# Patient Record
Sex: Female | Born: 1990 | Race: Black or African American | Hispanic: No | Marital: Married | Smoking: Former smoker
Health system: Southern US, Community
[De-identification: ages and names within clinical notes are randomized; demographics above are authoritative.]

## PROBLEM LIST (undated history)

## (undated) DIAGNOSIS — T7840XA Allergy, unspecified, initial encounter: Secondary | ICD-10-CM

## (undated) DIAGNOSIS — J45909 Unspecified asthma, uncomplicated: Secondary | ICD-10-CM

## (undated) DIAGNOSIS — Z6841 Body Mass Index (BMI) 40.0 and over, adult: Secondary | ICD-10-CM

## (undated) DIAGNOSIS — F329 Major depressive disorder, single episode, unspecified: Secondary | ICD-10-CM

## (undated) HISTORY — DX: Morbid (severe) obesity due to excess calories: E66.01

## (undated) HISTORY — DX: Unspecified asthma, uncomplicated: J45.909

## (undated) HISTORY — DX: Allergy, unspecified, initial encounter: T78.40XA

## (undated) HISTORY — DX: Body Mass Index (BMI) 40.0 and over, adult: Z684

## (undated) HISTORY — DX: Major depressive disorder, single episode, unspecified: F32.9

---
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
## 2013-07-18 ENCOUNTER — Other Ambulatory Visit: Payer: Self-pay | Admitting: Emergency Medicine

## 2013-07-18 ENCOUNTER — Ambulatory Visit (INDEPENDENT_AMBULATORY_CARE_PROVIDER_SITE_OTHER): Payer: BC Managed Care – PPO | Admitting: Emergency Medicine

## 2013-07-18 VITALS — BP 144/84 | HR 66 | Temp 98.5°F | Resp 16 | Ht 65.5 in | Wt 244.0 lb

## 2013-07-18 DIAGNOSIS — K589 Irritable bowel syndrome without diarrhea: Secondary | ICD-10-CM

## 2013-07-18 DIAGNOSIS — O039 Complete or unspecified spontaneous abortion without complication: Secondary | ICD-10-CM

## 2013-07-18 DIAGNOSIS — R109 Unspecified abdominal pain: Secondary | ICD-10-CM

## 2013-07-18 DIAGNOSIS — N898 Other specified noninflammatory disorders of vagina: Secondary | ICD-10-CM

## 2013-07-18 DIAGNOSIS — Z3200 Encounter for pregnancy test, result unknown: Secondary | ICD-10-CM

## 2013-07-18 LAB — POCT URINALYSIS DIPSTICK
Glucose, UA: NEGATIVE
Ketones, UA: NEGATIVE
Nitrite, UA: NEGATIVE
Protein, UA: NEGATIVE
Spec Grav, UA: >=1.03
Urobilinogen, UA: 0.2

## 2013-07-18 LAB — POCT CBC
Lymph, poc: 2.1 (ref 0.6–3.4)
MCH, POC: 27.9 pg (ref 27–31.2)
MCHC: 31.3 g/dL — AB (ref 31.8–35.4)
MPV: 10.8 fL (ref 0–99.8)
POC Granulocyte: 3.7 (ref 2–6.9)
POC LYMPH PERCENT: 31.2 %L (ref 10–50)
Platelet Count, POC: 329 10*3/uL (ref 142–424)
RBC: 5.09 M/uL (ref 4.04–5.48)
RDW, POC: 14.1 %

## 2013-07-18 LAB — POCT WET PREP WITH KOH
Trichomonas, UA: NEGATIVE
Yeast Wet Prep HPF POC: NEGATIVE

## 2013-07-18 LAB — POCT UA - MICROSCOPIC ONLY: Yeast, UA: NEGATIVE

## 2013-07-18 MED ORDER — METRONIDAZOLE 500 MG PO TABS: 500.0000 mg | ORAL_TABLET | Freq: Two times a day (BID) | ORAL | Status: DC

## 2013-07-18 MED ORDER — LOPERAMIDE HCL 2 MG PO TABS: 2.0000 mg | ORAL_TABLET | Freq: Three times a day (TID) | ORAL | Status: DC

## 2013-07-18 NOTE — Patient Instructions (Addendum)
Bacterial Vaginosis Bacterial vaginosis (BV) is a vaginal infection where the normal balance of bacteria in the vagina is disrupted. The normal balance is then replaced by an overgrowth of certain bacteria. There are several different kinds of bacteria that can cause BV. BV is the most common vaginal infection in women of childbearing age. CAUSES   The cause of BV is not fully understood. BV develops when there is an increase or imbalance of harmful bacteria.  Some activities or behaviors can upset the normal balance of bacteria in the vagina and put women at increased risk including:  Having a new sex partner or multiple sex partners.  Douching.  Using an intrauterine device (IUD) for contraception.  It is not clear what role sexual activity plays in the development of BV. However, women that have never had sexual intercourse are rarely infected with BV. Women do not get BV from toilet seats, bedding, swimming pools or from touching objects around them.  SYMPTOMS   Grey vaginal discharge.  A fish-like odor with discharge, especially after sexual intercourse.  Itching or burning of the vagina and vulva.  Burning or pain with urination.  Some women have no signs or symptoms at all. DIAGNOSIS  Your caregiver must examine the vagina for signs of BV. Your caregiver will perform lab tests and look at the sample of vaginal fluid through a microscope. They will look for bacteria and abnormal cells (clue cells), a pH test higher than 4.5, and a positive amine test all associated with BV.  RISKS AND COMPLICATIONS   Pelvic inflammatory disease (PID).  Infections following gynecology surgery.  Developing HIV.  Developing herpes virus. TREATMENT  Sometimes BV will clear up without treatment. However, all women with symptoms of BV should be treated to avoid complications, especially if gynecology surgery is planned. Female partners generally do not need to be treated. However, BV may spread  between female sex partners so treatment is helpful in preventing a recurrence of BV.   BV may be treated with antibiotics. The antibiotics come in either pill or vaginal cream forms. Either can be used with nonpregnant or pregnant women, but the recommended dosages differ. These antibiotics are not harmful to the baby.  BV can recur after treatment. If this happens, a second round of antibiotics will often be prescribed.  Treatment is important for pregnant women. If not treated, BV can cause a premature delivery, especially for a pregnant woman who had a premature birth in the past. All pregnant women who have symptoms of BV should be checked and treated.  For chronic reoccurrence of BV, treatment with a type of prescribed gel vaginally twice a week is helpful. HOME CARE INSTRUCTIONS   Finish all medication as directed by your caregiver.  Do not have sex until treatment is completed.  Tell your sexual partner that you have a vaginal infection. They should see their caregiver and be treated if they have problems, such as a mild rash or itching.  Practice safe sex. Use condoms. Only have 1 sex partner. PREVENTION  Basic prevention steps can help reduce the risk of upsetting the natural balance of bacteria in the vagina and developing BV:  Do not have sexual intercourse (be abstinent).  Do not douche.  Use all of the medicine prescribed for treatment of BV, even if the signs and symptoms go away.  Tell your sex partner if you have BV. That way, they can be treated, if needed, to prevent reoccurrence. SEEK MEDICAL CARE IF:     Your symptoms are not improving after 3 days of treatment.  You have increased discharge, pain, or fever. MAKE SURE YOU:   Understand these instructions.  Will watch your condition.  Will get help right away if you are not doing well or get worse. FOR MORE INFORMATION  Division of STD Prevention (DSTDP), Centers for Disease Control and Prevention:  SolutionApps.co.za American Social Health Association (ASHA): www.ashastd.org  Document Released: 07/06/2005 Document Revised: 09/28/2011 Document Reviewed: 02/15/2013 [facility] Patient Information 2014 [place], [place]. Irritable Bowel Syndrome Irritable Bowel Syndrome (IBS) is caused by a disturbance of normal bowel function. Other terms used are spastic colon, mucous colitis, and irritable colon. It does not require surgery, nor does it lead to cancer. There is no cure for IBS. But with proper diet, stress reduction, and medication, you will find that your problems (symptoms) will gradually disappear or improve. IBS is a common digestive disorder. It usually appears in late adolescence or early adulthood. Women develop it twice as often as men. CAUSES  After food has been digested and absorbed in the small intestine, waste material is moved into the colon (large intestine). In the colon, water and salts are absorbed from the undigested products coming from the small intestine. The remaining residue, or fecal material, is held for elimination. Under normal circumstances, gentle, rhythmic contractions on the bowel walls push the fecal material along the colon towards the rectum. In IBS, however, these contractions are irregular and poorly coordinated. The fecal material is either retained too long, resulting in constipation, or expelled too soon, producing diarrhea. SYMPTOMS  The most common symptom of IBS is pain. It is typically in the lower left side of the belly (abdomen). But it may occur anywhere in the abdomen. It can be felt as heartburn, backache, or even as a dull pain in the arms or shoulders. The pain comes from excessive bowel-muscle spasms and from the buildup of gas and fecal material in the colon. This pain:  Can range from sharp belly (abdominal) cramps to a dull, continuous ache.  Usually worsens soon after eating.  Is typically relieved by having a bowel movement or passing  gas. Abdominal pain is usually accompanied by constipation. But it may also produce diarrhea. The diarrhea typically occurs right after a meal or upon arising in the morning. The stools are typically soft and watery. They are often flecked with secretions (mucus). Other symptoms of IBS include:  Bloating.  Loss of appetite.  Heartburn.  Feeling sick to your stomach (nausea).  Belching  Vomiting  Gas. IBS may also cause a number of symptoms that are unrelated to the digestive system:  Fatigue.  Headaches.  Anxiety  Shortness of breath  Difficulty in concentrating.  Dizziness. These symptoms tend to come and go. DIAGNOSIS  The symptoms of IBS closely mimic the symptoms of other, more serious digestive disorders. So your caregiver may wish to perform a variety of additional tests to exclude these disorders. He/she wants to be certain of learning what is wrong (diagnosis). The nature and purpose of each test will be explained to you. TREATMENT A number of medications are available to help correct bowel function and/or relieve bowel spasms and abdominal pain. Among the drugs available are:  Mild, non-irritating laxatives for severe constipation and to help restore normal bowel habits.  Specific anti-diarrheal medications to treat severe or prolonged diarrhea.  Anti-spasmodic agents to relieve intestinal cramps.  Your caregiver may also decide to treat you with a mild tranquilizer or sedative during  unusually stressful periods in your life. The important thing to remember is that if any drug is prescribed for you, make sure that you take it exactly as directed. Make sure that your caregiver knows how well it worked for you. HOME CARE INSTRUCTIONS   Avoid foods that are high in fat or oils. Some examples WUJ:[id number] cream, butter, frankfurters, sausage, and other fatty meats.  Avoid foods that have a laxative effect, such as fruit, fruit juice, and dairy products.  Cut out  carbonated drinks, chewing gum, and "gassy" foods, such as beans and cabbage. This may help relieve bloating and belching.  Bran taken with plenty of liquids may help relieve constipation.  Keep track of what foods seem to trigger your symptoms.  Avoid emotionally charged situations or circumstances that produce anxiety.  Start or continue exercising.  Get plenty of rest and sleep. MAKE SURE YOU:   Understand these instructions.  Will watch your condition.  Will get help right away if you are not doing well or get worse. Document Released: 07/06/2005 Document Revised: 09/28/2011 Document Reviewed: 02/24/2008 [facility] Patient Information 2014 [place], [place]. Miscarriage A miscarriage is the sudden loss of an unborn baby (fetus) before the 20th week of pregnancy. Most miscarriages happen in the first 3 months of pregnancy. Sometimes, it happens before a woman even knows she is pregnant. A miscarriage is also called a "spontaneous miscarriage" or "early pregnancy loss." Having a miscarriage can be an emotional experience. Talk with your caregiver about any questions you may have about miscarrying, the grieving process, and your future pregnancy plans. CAUSES   Problems with the fetal chromosomes that make it impossible for the baby to develop normally. Problems with the baby's genes or chromosomes are most often the result of errors that occur, by chance, as the embryo divides and grows. The problems are not inherited from the parents.  Infection of the cervix or uterus.   Hormone problems.   Problems with the cervix, such as having an incompetent cervix. This is when the tissue in the cervix is not strong enough to hold the pregnancy.   Problems with the uterus, such as an abnormally shaped uterus, uterine fibroids, or congenital abnormalities.   Certain medical conditions.   Smoking, drinking alcohol, or taking illegal drugs.   Trauma.  Often, the cause of a miscarriage  is unknown.  SYMPTOMS   Vaginal bleeding or spotting, with or without cramps or pain.  Pain or cramping in the abdomen or lower back.  Passing fluid, tissue, or blood clots from the vagina. DIAGNOSIS  Your caregiver will perform a physical exam. You may also have an ultrasound to confirm the miscarriage. Blood or urine tests may also be ordered. TREATMENT   Sometimes, treatment is not necessary if you naturally pass all the fetal tissue that was in the uterus. If some of the fetus or placenta remains in the body (incomplete miscarriage), tissue left behind may become infected and must be removed. Usually, a dilation and curettage (D and C) procedure is performed. During a D and C procedure, the cervix is widened (dilated) and any remaining fetal or placental tissue is gently removed from the uterus.  Antibiotic medicines are prescribed if there is an infection. Other medicines may be given to reduce the size of the uterus (contract) if there is a lot of bleeding.  If you have Rh negative blood and your baby was Rh positive, you will need a Rh immunoglobulin shot. This shot will protect any future  baby from having Rh blood problems in future pregnancies. HOME CARE INSTRUCTIONS   Your caregiver may order bed rest or may allow you to continue light activity. Resume activity as directed by your caregiver.  Have someone help with home and family responsibilities during this time.   Keep track of the number of sanitary pads you use each day and how soaked (saturated) they are. Write down this information.   Do not use tampons. Do not douche or have sexual intercourse until approved by your caregiver.   Only take over-the-counter or prescription medicines for pain or discomfort as directed by your caregiver.   Do not take aspirin. Aspirin can cause bleeding.   Keep all follow-up appointments with your caregiver.   If you or your partner have problems with grieving, talk to your  caregiver or seek counseling to help cope with the pregnancy loss. Allow enough time to grieve before trying to get pregnant again.  SEEK IMMEDIATE MEDICAL CARE IF:   You have severe cramps or pain in your back or abdomen.  You have a fever.  You pass large blood clots (walnut-sized or larger) ortissue from your vagina. Save any tissue for your caregiver to inspect.   Your bleeding increases.   You have a thick, bad-smelling vaginal discharge.  You become lightheaded, weak, or you faint.   You have chills.  MAKE SURE YOU:  Understand these instructions.  Will watch your condition.  Will get help right away if you are not doing well or get worse. Document Released: 12/30/2000 Document Revised: 10/31/2012 Document Reviewed: 08/25/2011 [facility] Patient Information 2014 [place], [place].

## 2013-07-18 NOTE — Progress Notes (Signed)
[facility] [address], [place] [address] [phone]  Date:  07/18/2013   Name:  Shannon Lopez   DOB:  Nov 18, 1990   MRN:  [id number]  PCP:  No PCP Per Patient    Chief Complaint: Fatigue, Diarrhea, Nausea, Abdominal Pain and Dysmenorrhea   History of Present Illness:  Shannon Lopez is a 22 y.o. very pleasant female patient who presents with the following:  Multiple complaints.  Having 5-7 loose stools sometimes interrupting sleep. No fever or chills.  The patient has no complaint of blood, mucous, or pus in her stools. Frequent abdominal cramping and rumbling.  Unusual stress over past month or so.  No nausea or vomiting.  No improvement with over the counter medications or other home remedies.  Had breast tenderness and AM nausea and a positive pregnancy test after a missed period.  Bled heavily through December before spotting the rest of the month.  No passage of tissue.  Now has negative pregnancy test.   Not using contraceptive.  There are no active problems to display for this patient.   Past Medical History  Diagnosis Date  . Allergy   . Asthma     No past surgical history on file.  History  Substance Use Topics  . Smoking status: Never Smoker   . Smokeless tobacco: Not on file  . Alcohol Use: Not on file    Family History  Problem Relation Age of Onset  . Hyperlipidemia Father   . Heart disease Father   . Diabetes Maternal Grandmother     No Known Allergies  Medication list has been reviewed and updated.  No current outpatient prescriptions on file prior to visit.   No current facility-administered medications on file prior to visit.    Review of Systems:  As per HPI, otherwise negative.    Physical Examination: Filed Vitals:   07/18/13 1143  BP: 144/84  Pulse: 66  Temp: 98.5 F (36.9 C)  Resp: 16   Filed Vitals:   07/18/13 1143  Height: 5' 5.5" (1.664 m)  Weight: 244 lb (110.678 kg)   Body mass index is  39.97 kg/(m^2). Ideal Body Weight: Weight in (lb) to have BMI = 25: 152.2  GEN: WDWN, NAD, Non-toxic, A & O x 3 HEENT: Atraumatic, Normocephalic. Neck supple. No masses, No LAD. Ears and Nose: No external deformity. CV: RRR, No M/G/R. No JVD. No thrill. No extra heart sounds. PULM: CTA B, no wheezes, crackles, rhonchi. No retractions. No resp. distress. No accessory muscle use. ABD: S, NT, ND, +BS. No rebound. No HSM. EXTR: No c/c/e NEURO Normal gait.  PSYCH: Normally interactive. Conversant. Not depressed or anxious appearing.  Calm demeanor.  Physical Examination: Pelvic - normal external genitalia, vulva, vagina, cervix, uterus and adnexa,  exam chaperoned by Lurena Joiner   Assessment and Plan: Miscarriage IBS Loperamide sono pelvis Labs BBV Flagyl  Signed,  Phillips Odor, MD   Results for orders placed in visit on 07/18/13  POCT URINALYSIS DIPSTICK      Result Value Range   Color, UA yellow     Clarity, UA clear     Glucose, UA neg     Bilirubin, UA neg     Ketones, UA neg     Spec Grav, UA >=1.030     Blood, UA trace-intact     pH, UA 5.5     Protein, UA neg     Urobilinogen, UA 0.2     Nitrite, UA neg  Leukocytes, UA Trace    POCT URINE PREGNANCY      Result Value Range   Preg Test, Ur Negative    POCT UA - MICROSCOPIC ONLY      Result Value Range   WBC, Ur, HPF, POC 2-5     RBC, urine, microscopic 0-2     Bacteria, U Microscopic 1+     Mucus, UA positive     Epithelial cells, urine per micros 3-5     Crystals, Ur, HPF, POC neg     Casts, Ur, LPF, POC neg     Yeast, UA neg     Results for orders placed in visit on 07/18/13  POCT URINALYSIS DIPSTICK      Result Value Range   Color, UA yellow     Clarity, UA clear     Glucose, UA neg     Bilirubin, UA neg     Ketones, UA neg     Spec Grav, UA >=1.030     Blood, UA trace-intact     pH, UA 5.5     Protein, UA neg     Urobilinogen, UA 0.2     Nitrite, UA neg     Leukocytes, UA Trace    POCT  URINE PREGNANCY      Result Value Range   Preg Test, Ur Negative    POCT UA - MICROSCOPIC ONLY      Result Value Range   WBC, Ur, HPF, POC 2-5     RBC, urine, microscopic 0-2     Bacteria, U Microscopic 1+     Mucus, UA positive     Epithelial cells, urine per micros 3-5     Crystals, Ur, HPF, POC neg     Casts, Ur, LPF, POC neg     Yeast, UA neg    POCT CBC      Result Value Range   WBC 6.6  4.6 - 10.2 K/uL   Lymph, poc 2.1  0.6 - 3.4   POC LYMPH PERCENT 31.2  10 - 50 %L   MID (cbc) 0.8  0 - 0.9   POC MID % 12.0  0 - 12 %M   POC Granulocyte 3.7  2 - 6.9   Granulocyte percent 56.8  37 - 80 %G   RBC 5.09  4.04 - 5.48 M/uL   Hemoglobin 14.2  12.2 - 16.2 g/dL   HCT, POC 16.1  09.6 - 47.9 %   MCV 89.2  80 - 97 fL   MCH, POC 27.9  27 - 31.2 pg   MCHC 31.3 (*) 31.8 - 35.4 g/dL   RDW, POC 04.5     Platelet Count, POC 329  142 - 424 K/uL   MPV 10.8  0 - 99.8 fL   Results for orders placed in visit on 07/18/13  POCT URINALYSIS DIPSTICK      Result Value Range   Color, UA yellow     Clarity, UA clear     Glucose, UA neg     Bilirubin, UA neg     Ketones, UA neg     Spec Grav, UA >=1.030     Blood, UA trace-intact     pH, UA 5.5     Protein, UA neg     Urobilinogen, UA 0.2     Nitrite, UA neg     Leukocytes, UA Trace    POCT URINE PREGNANCY      Result Value Range   Preg Test,  Ur Negative    POCT UA - MICROSCOPIC ONLY      Result Value Range   WBC, Ur, HPF, POC 2-5     RBC, urine, microscopic 0-2     Bacteria, U Microscopic 1+     Mucus, UA positive     Epithelial cells, urine per micros 3-5     Crystals, Ur, HPF, POC neg     Casts, Ur, LPF, POC neg     Yeast, UA neg    POCT CBC      Result Value Range   WBC 6.6  4.6 - 10.2 K/uL   Lymph, poc 2.1  0.6 - 3.4   POC LYMPH PERCENT 31.2  10 - 50 %L   MID (cbc) 0.8  0 - 0.9   POC MID % 12.0  0 - 12 %M   POC Granulocyte 3.7  2 - 6.9   Granulocyte percent 56.8  37 - 80 %G   RBC 5.09  4.04 - 5.48 M/uL   Hemoglobin  14.2  12.2 - 16.2 g/dL   HCT, POC 46.9  62.9 - 47.9 %   MCV 89.2  80 - 97 fL   MCH, POC 27.9  27 - 31.2 pg   MCHC 31.3 (*) 31.8 - 35.4 g/dL   RDW, POC 52.8     Platelet Count, POC 329  142 - 424 K/uL   MPV 10.8  0 - 99.8 fL  POCT WET PREP WITH KOH      Result Value Range   Trichomonas, UA Negative     Clue Cells Wet Prep HPF POC tntc     Epithelial Wet Prep HPF POC 2-4     Yeast Wet Prep HPF POC neg     Bacteria Wet Prep HPF POC 2+     RBC Wet Prep HPF POC 0-2     WBC Wet Prep HPF POC tntc     KOH Prep POC Negative

## 2013-07-19 LAB — COMPREHENSIVE METABOLIC PANEL
ALT: 24 U/L (ref 0–35)
Albumin: 4 g/dL (ref 3.5–5.2)
Calcium: 9.1 mg/dL (ref 8.4–10.5)
Chloride: 103 mEq/L (ref 96–112)
Creat: 0.64 mg/dL (ref 0.50–1.10)
Potassium: 4.6 mEq/L (ref 3.5–5.3)
Total Bilirubin: 0.3 mg/dL (ref 0.3–1.2)
Total Protein: 7.5 g/dL (ref 6.0–8.3)

## 2013-07-19 LAB — TSH: TSH: 1.952 u[IU]/mL (ref 0.350–4.500)

## 2013-08-19 ENCOUNTER — Telehealth: Payer: Self-pay

## 2013-08-19 NOTE — Telephone Encounter (Signed)
Pt called back in regards to unable to reach letter sent from lab. Notified of normal labs

## 2013-08-25 ENCOUNTER — Encounter: Payer: Self-pay | Admitting: Gastroenterology

## 2013-09-25 ENCOUNTER — Encounter: Payer: Self-pay | Admitting: Gastroenterology

## 2013-09-25 ENCOUNTER — Other Ambulatory Visit (INDEPENDENT_AMBULATORY_CARE_PROVIDER_SITE_OTHER): Payer: 59

## 2013-09-25 ENCOUNTER — Ambulatory Visit (INDEPENDENT_AMBULATORY_CARE_PROVIDER_SITE_OTHER): Payer: 59 | Admitting: Gastroenterology

## 2013-09-25 VITALS — BP 110/62 | HR 64 | Ht 64.5 in | Wt 250.0 lb

## 2013-09-25 DIAGNOSIS — R197 Diarrhea, unspecified: Secondary | ICD-10-CM

## 2013-09-25 LAB — CBC WITH DIFFERENTIAL/PLATELET
BASOS PCT: 0.6 % (ref 0.0–3.0)
Basophils Absolute: 0 10*3/uL (ref 0.0–0.1)
EOS ABS: 1.2 10*3/uL — AB (ref 0.0–0.7)
Eosinophils Relative: 14.9 % — ABNORMAL HIGH (ref 0.0–5.0)
HCT: 43.4 % (ref 36.0–46.0)
HEMOGLOBIN: 14.4 g/dL (ref 12.0–15.0)
LYMPHS ABS: 2.2 10*3/uL (ref 0.7–4.0)
LYMPHS PCT: 28 % (ref 12.0–46.0)
MCHC: 33.2 g/dL (ref 30.0–36.0)
MCV: 83.8 fl (ref 78.0–100.0)
MONO ABS: 0.6 10*3/uL (ref 0.1–1.0)
Monocytes Relative: 7.3 % (ref 3.0–12.0)
NEUTROS ABS: 3.9 10*3/uL (ref 1.4–7.7)
Neutrophils Relative %: 49.2 % (ref 43.0–77.0)
Platelets: 321 10*3/uL (ref 150.0–400.0)
RBC: 5.18 Mil/uL — AB (ref 3.87–5.11)
RDW: 14.1 % (ref 11.5–14.6)
WBC: 7.9 10*3/uL (ref 4.5–10.5)

## 2013-09-25 LAB — SEDIMENTATION RATE: Sed Rate: 19 mm/hr (ref 0–22)

## 2013-09-25 MED ORDER — MOVIPREP 100 G PO SOLR: 1.0000 | Freq: Once | ORAL | Status: DC

## 2013-09-25 NOTE — Patient Instructions (Addendum)
One of your biggest health concerns is your smoking.  This increases your risk for most cancers and serious cardiovascular diseases such as strokes, heart attacks.  You should try your best to stop.  If you need assistance, please contact your PCP or Smoking Cessation Class at [facility] [phone]) or [facility] (1-800-QUIT-NOW). You will have labs checked today in the basement lab.  Please head down after you check out with the front desk  (celiac pane, cbc, esr, stool for C. Diff, ova and parasites, routine culture). You will be set up for a colonoscopy for chronic diarrhea. Start once daily imodium for now.

## 2013-09-25 NOTE — Addendum Note (Signed)
Addended by: Richardson ChiquitoSMITH, Aliesha Dolata N on: 09/25/2013 02:56 PM   Modules accepted: Orders

## 2013-09-25 NOTE — Progress Notes (Signed)
HPI: This is a  very pleasant 23 year old woman who was born in [place].  Was referred by [facility] urgent [facility].  SHe is "not too sure why"  Has not taken imodium.  She will always awaken with nausea, will vomit if she doesn't drink.  She has diarrhea, 1-2 days per week with 6-7 times a day diarrhea.    Usually she has 4-5 stools per day, probably started around 5-6 months ago at time of her moving from   Used to have 1-2 bms per day.   Saw blood in her stool one time.  She has cramping abdominal pain several times per week.  No caffeine, no etoh.  1-2 cigs per day.       Review of systems: Pertinent positive and negative review of systems were noted in the above HPI section. Complete review of systems was performed and was otherwise normal.    Past Medical History  Diagnosis Date  . Allergy   . Asthma   . Morbid obesity with BMI of 40.0-44.9, adult     History reviewed. No pertinent past surgical history.  No current outpatient prescriptions on file.   No current facility-administered medications for this visit.    Allergies as of 09/25/2013  . (No Known Allergies)    Family History  Problem Relation Age of Onset  . Hyperlipidemia Father   . Heart disease Father   . Diabetes Maternal Grandmother     both sides of the family  . Diabetes Father   . Irritable bowel syndrome Father   . Irritable bowel syndrome Sister     History   Social History  . Marital Status: Single    Spouse Name: N/A    Number of Children: 0  . Years of Education: N/A   Occupational History  . student    Social History Main Topics  . Smoking status: Current Every Day Smoker    Types: Cigarettes  . Smokeless tobacco: Never Used  . Alcohol Use: No  . Drug Use: No  . Sexual Activity: Not on file   Other Topics Concern  . Not on file   Social History Narrative  . No narrative on file       Physical Exam: BP 110/62  Pulse 64  Ht 5' 4.5" (1.638 m)  Wt 250 lb  (113.399 kg)  BMI 42.27 kg/m2  LMP 09/05/2013 Constitutional: generally well-appearing Psychiatric: alert and oriented x3 Eyes: extraocular movements intact Mouth: oral pharynx moist, no lesions Neck: supple no lymphadenopathy Cardiovascular: heart regular rate and rhythm Lungs: clear to auscultation bilaterally Abdomen: soft, nontender, nondistended, no obvious ascites, no peritoneal signs, normal bowel sounds Extremities: no lower extremity edema bilaterally Skin: no lesions on visible extremities    Assessment and plan: 23 y.o. female with  chronic diarrhea for the past 6 months, abdominal cramping, nausea  She had a clear change in her bowels about 5-6 months ago. No preceding antibiotics. Only a single episode of minor rectal bleeding. Perhaps she has knew colitis, perhaps Crohn's disease. Perhaps chronic infection. She is going to get a battery of blood tests and stool tests, see the summary below. I will also arrange for colonoscopy at her soonest convenience. For now she will retry Imodium one pill once daily.

## 2013-09-26 ENCOUNTER — Telehealth: Payer: Self-pay | Admitting: Gastroenterology

## 2013-09-26 ENCOUNTER — Other Ambulatory Visit: Payer: 59

## 2013-09-26 LAB — CELIAC PANEL 10
Endomysial Screen: NEGATIVE
GLIADIN IGA: 3.3 U/mL (ref <20)
GLIADIN IGG: 12.2 U/mL (ref <20)
IgA: 236 mg/dL (ref 69–380)
TISSUE TRANSGLUT AB: 9.6 U/mL (ref <20)
Tissue Transglutaminase Ab, IgA: 4.4 U/mL (ref <20)

## 2013-09-26 LAB — PREGNANCY, URINE: Preg Test, Ur: NEGATIVE

## 2013-09-26 NOTE — Telephone Encounter (Signed)
[facility] made a mistake and sent pt to our office and then called me and asked to have labs changed to future.  The lab discovered that the labs were entered correctly but had sent the pt to our office.  I advised pt to go back to the lab that the order was correct

## 2013-09-27 ENCOUNTER — Telehealth: Payer: Self-pay | Admitting: Gastroenterology

## 2013-09-27 ENCOUNTER — Encounter: Payer: Self-pay | Admitting: Gastroenterology

## 2013-09-27 LAB — CLOSTRIDIUM DIFFICILE BY PCR: CDIFFPCR: NOT DETECTED

## 2013-09-27 NOTE — Telephone Encounter (Signed)
Left message on machine to call back and advised that the labs have not been reviewed by Dr Christella HartiganJacobs and we will call her as soon as available.

## 2013-09-30 LAB — STOOL CULTURE

## 2013-11-01 ENCOUNTER — Emergency Department (HOSPITAL_COMMUNITY)
Admission: EM | Admit: 2013-11-01 | Discharge: 2013-11-01 | Disposition: A | Discharge status: Home / Self Care | Payer: 59 | Attending: Emergency Medicine | Admitting: Emergency Medicine

## 2013-11-01 ENCOUNTER — Emergency Department (HOSPITAL_COMMUNITY): Payer: 59

## 2013-11-01 ENCOUNTER — Encounter (HOSPITAL_COMMUNITY): Payer: Self-pay | Admitting: Emergency Medicine

## 2013-11-01 DIAGNOSIS — Z79899 Other long term (current) drug therapy: Secondary | ICD-10-CM | POA: Insufficient documentation

## 2013-11-01 DIAGNOSIS — J45909 Unspecified asthma, uncomplicated: Secondary | ICD-10-CM | POA: Insufficient documentation

## 2013-11-01 DIAGNOSIS — F17200 Nicotine dependence, unspecified, uncomplicated: Secondary | ICD-10-CM | POA: Insufficient documentation

## 2013-11-01 DIAGNOSIS — R0789 Other chest pain: Secondary | ICD-10-CM

## 2013-11-01 DIAGNOSIS — S139XXA Sprain of joints and ligaments of unspecified parts of neck, initial encounter: Secondary | ICD-10-CM | POA: Insufficient documentation

## 2013-11-01 DIAGNOSIS — Y92410 Unspecified street and highway as the place of occurrence of the external cause: Secondary | ICD-10-CM | POA: Insufficient documentation

## 2013-11-01 DIAGNOSIS — IMO0002 Reserved for concepts with insufficient information to code with codable children: Secondary | ICD-10-CM | POA: Insufficient documentation

## 2013-11-01 DIAGNOSIS — S298XXA Other specified injuries of thorax, initial encounter: Secondary | ICD-10-CM | POA: Insufficient documentation

## 2013-11-01 DIAGNOSIS — Y9389 Activity, other specified: Secondary | ICD-10-CM | POA: Insufficient documentation

## 2013-11-01 DIAGNOSIS — S161XXA Strain of muscle, fascia and tendon at neck level, initial encounter: Secondary | ICD-10-CM

## 2013-11-01 MED ORDER — HYDROCODONE-ACETAMINOPHEN 5-325 MG PO TABS
1.0000 | ORAL_TABLET | Freq: Once | ORAL | Status: AC
  Administered 2013-11-01: 1 via ORAL
  Filled 2013-11-01: qty 1

## 2013-11-01 MED ORDER — HYDROCODONE-ACETAMINOPHEN 5-325 MG PO TABS: 1.0000 | ORAL_TABLET | Freq: Four times a day (QID) | ORAL | Status: DC | PRN

## 2013-11-01 MED ORDER — CYCLOBENZAPRINE HCL 10 MG PO TABS: 10.0000 mg | ORAL_TABLET | Freq: Three times a day (TID) | ORAL | Status: DC | PRN

## 2013-11-01 MED ORDER — IBUPROFEN 800 MG PO TABS: 800.0000 mg | ORAL_TABLET | Freq: Three times a day (TID) | ORAL | Status: DC | PRN

## 2013-11-01 MED ORDER — IBUPROFEN 800 MG PO TABS
800.0000 mg | ORAL_TABLET | Freq: Once | ORAL | Status: AC
  Administered 2013-11-01: 800 mg via ORAL
  Filled 2013-11-01: qty 1
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
## 2013-11-01 NOTE — ED Notes (Signed)
Bed: [id number] Expected date:  Expected time:  Means of arrival:  Comments: Hold for tri 2
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
## 2013-11-01 NOTE — ED Notes (Signed)
Pt [facility] radiology at this time, [facility]  Denies further needs/complaints currently.  NAD.
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
## 2013-11-01 NOTE — Discharge Instructions (Signed)
Return here as needed.  Your x-rays did not show any abnormalities.  Use ice and heat on areas of sore.  Expect to be more sore tomorrow and over the next 7-10 days

## 2013-11-02 NOTE — ED Provider Notes (Signed)
Medical screening examination/treatment/procedure(s) were performed by non-physician practitioner and as supervising physician I was immediately available for consultation/collaboration.   EKG Interpretation None        William Earnie Bechard, MD 11/02/13 0008 

## 2013-11-07 ENCOUNTER — Encounter: Payer: 59 | Admitting: Gastroenterology

## 2013-11-07 ENCOUNTER — Telehealth: Payer: Self-pay | Admitting: Gastroenterology

## 2013-11-07 NOTE — Telephone Encounter (Signed)
Ok, she should be charged. We scheduled this at her OV about a month ago.

## 2013-11-08 NOTE — Telephone Encounter (Signed)
Open in error

## 2014-05-20 ENCOUNTER — Ambulatory Visit (INDEPENDENT_AMBULATORY_CARE_PROVIDER_SITE_OTHER): Payer: 59 | Admitting: Family Medicine

## 2014-05-20 VITALS — BP 104/60 | HR 86 | Temp 98.3°F | Resp 16 | Ht 64.0 in | Wt 223.0 lb

## 2014-05-20 DIAGNOSIS — J4541 Moderate persistent asthma with (acute) exacerbation: Secondary | ICD-10-CM

## 2014-05-20 DIAGNOSIS — J45909 Unspecified asthma, uncomplicated: Secondary | ICD-10-CM

## 2014-05-20 DIAGNOSIS — M791 Myalgia, unspecified site: Secondary | ICD-10-CM

## 2014-05-20 DIAGNOSIS — R21 Rash and other nonspecific skin eruption: Secondary | ICD-10-CM

## 2014-05-20 MED ORDER — PREDNISONE 20 MG PO TABS: ORAL_TABLET | ORAL | Status: DC

## 2014-05-20 MED ORDER — ALBUTEROL SULFATE HFA 108 (90 BASE) MCG/ACT IN AERS: 2.0000 | INHALATION_SPRAY | RESPIRATORY_TRACT | Status: DC | PRN

## 2014-05-20 MED ORDER — BUDESONIDE-FORMOTEROL FUMARATE 80-4.5 MCG/ACT IN AERO: 2.0000 | INHALATION_SPRAY | Freq: Two times a day (BID) | RESPIRATORY_TRACT | Status: DC

## 2014-05-20 MED ORDER — CETIRIZINE HCL 10 MG PO TABS: 10.0000 mg | ORAL_TABLET | Freq: Every day | ORAL | Status: DC

## 2014-05-20 MED ORDER — IBUPROFEN 600 MG PO TABS: 600.0000 mg | ORAL_TABLET | Freq: Three times a day (TID) | ORAL | Status: DC | PRN

## 2014-05-20 MED ORDER — IPRATROPIUM BROMIDE 0.02 % IN SOLN
0.5000 mg | Freq: Once | RESPIRATORY_TRACT | Status: AC
  Administered 2014-05-20: 0.5 mg via RESPIRATORY_TRACT

## 2014-05-20 MED ORDER — ALBUTEROL SULFATE (2.5 MG/3ML) 0.083% IN NEBU
2.5000 mg | INHALATION_SOLUTION | Freq: Once | RESPIRATORY_TRACT | Status: AC
  Administered 2014-05-20: 2.5 mg via RESPIRATORY_TRACT

## 2014-05-20 NOTE — Progress Notes (Signed)
Subjective:    Patient ID: Shannon Lopez, female    DOB: 10/14/1990, 23 y.o.   MRN: [id number]  HPI Patient presents for cough, CP, and rash on face. Patient has h/o asthma that is not controlled. Has had cough for two weeks that is productive with clear-yellow mucus when the weather is colder or rainy. Endorses wheezing and sneezing. Has rhinorrhea and congestion when weather is bad. Has had N/V secondary to coughing spells. Coughing worse at night. Has been using albuterol inhaler 2-4x/day. Felt she had better control when she was using Symbicort, but has not had anymore refills this years. Presently taking cetirizine and singular.  Right sided chest pain present for 4 days. 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 No loss of sensation of limb or extremities or tingling or numbness. Has not tried anything for relief. Has had h/o possible rotator cuff tear that she was hoping she did not make worse. Denies h/o of dyslipidemia, HTN, or DM.  Rash on face was first noticed this morning when she woke up. Small bumps all over face and has not spread. Itches, but is not painful and no pus. Denies new soaps, lotions or foods tried this past week.     Review of Systems  Constitutional: Negative for chills, diaphoresis, activity change, appetite change and fatigue.  HENT: Positive for congestion, rhinorrhea and sneezing. Negative for ear discharge, ear pain, postnasal drip, sinus pressure and sore throat.   Eyes: Negative for pain, discharge and itching.  Respiratory: Positive for cough (productive) and wheezing. Negative for chest tightness and shortness of breath.   Cardiovascular: Positive for chest pain. Negative for palpitations and leg swelling.  Gastrointestinal: Positive for vomiting (isolated episode; secondary to cough). Negative for nausea and abdominal pain.  Musculoskeletal: Positive  for myalgias (right side of chest). Negative for back pain, joint swelling, arthralgias, neck pain and neck stiffness.  Skin: Positive for rash (on face). Negative for pallor and wound.  Allergic/Immunologic: Negative for environmental allergies and food allergies.  Neurological: Negative for dizziness, light-headedness and headaches.  Hematological: Negative for adenopathy.       Objective:   Physical Exam  Constitutional: She is oriented to person, place, and time. She appears well-developed and well-nourished. No distress.  Blood pressure 104/60, pulse 86, temperature 98.3 F (36.8 C), resp. rate 16, height 5\' 4"  (1.626 m), weight 223 lb (101.152 kg), last menstrual period 04/19/2014, SpO2 98 %.   HENT:  Head: Normocephalic and atraumatic.  Right Ear: Tympanic membrane, external ear and ear canal normal. No middle ear effusion.  Left Ear: Tympanic membrane, external ear and ear canal normal.  No middle ear effusion.  Nose: Rhinorrhea present. No mucosal edema or sinus tenderness. Right sinus exhibits no maxillary sinus tenderness and no frontal sinus tenderness. Left sinus exhibits no maxillary sinus tenderness and no frontal sinus tenderness.  Mouth/Throat: Uvula is midline and oropharynx is clear and moist. Mucous membranes are not pale, not dry and not cyanotic.  Eyes: Conjunctivae are normal. Pupils are equal, round, and reactive to light. Right eye exhibits no discharge. Left eye exhibits no discharge.  Neck: Normal range of motion. Neck supple.  Cardiovascular: Normal rate, regular rhythm and normal heart sounds.  Exam reveals no gallop and no friction rub.   No murmur heard. Pulmonary/Chest: Effort normal. No accessory muscle usage or stridor. No tachypnea. No  respiratory distress. She has no decreased breath sounds. She has wheezes in the right upper field, the right middle field, the right lower field, the left upper field and the left middle field. She has no rhonchi. She has no  rales. She exhibits tenderness (reproduced only on right upper chest).  Abdominal: Soft. Bowel sounds are normal. There is no tenderness.  Musculoskeletal:       Arms: Lymphadenopathy:    She has no cervical adenopathy.  Neurological: She is alert and oriented to person, place, and time. She has normal reflexes. No cranial nerve deficit. She exhibits normal muscle tone. Coordination normal.  Skin: Skin is warm, dry and intact. Rash noted. Rash is papular (fine, widespread over face with more prominence around chin). She is not diaphoretic. No erythema. No pallor.   Pre-neb peak flow: 220, 300 Post-neb peak flow: 300, 350  Neb: Albuterol and Ipratropium      Assessment & Plan:  1. Asthma with acute exacerbation, moderate persistent 2. Asthma, unspecified asthma severity, uncomplicated -Peak flows performed. - albuterol (PROVENTIL) (2.5 MG/3ML) 0.083% nebulizer solution 2.5 mg; Take 3 mLs (2.5 mg total) by nebulization once. - ipratropium (ATROVENT) nebulizer solution 0.5 mg; Take 2.5 mLs (0.5 mg total) by nebulization once. - montelukast (SINGULAIR) 10 MG tablet; Take 10 mg by mouth at bedtime. - albuterol (PROVENTIL HFA;VENTOLIN HFA) 108 (90 BASE) MCG/ACT inhaler; Inhale 2 puffs into the lungs every 4 (four) hours as needed for wheezing or shortness of breath.  Dispense: 1 Inhaler; Refill: 11  To be taken daily for 1 week then prn. - budesonide-formoterol (SYMBICORT) 80-4.5 MCG/ACT inhaler; Inhale 2 puffs into the lungs 2 (two) times daily.  Dispense: 1 Inhaler; Refill: 5 - predniSONE (DELTASONE) 20 MG tablet; Take 3 PO QAM x3days, 2 PO QAM x3days, 1 PO QAM x3days  Dispense: 18 tablet; Refill: 0 - cetirizine (ZYRTEC) 10 MG tablet; Take 1 tablet (10 mg total) by mouth daily.  Dispense: 30 tablet; Refill: 11  3. Muscle pain - ibuprofen (ADVIL,MOTRIN) 600 MG tablet; Take 1 tablet (600 mg total) by mouth every 8 (eight) hours as needed.  Dispense: 30 tablet; Refill: 0  4. Rash and  nonspecific skin eruption - predniSONE (DELTASONE) 20 MG tablet; Take 3 PO QAM x3days, 2 PO QAM x3days, 1 PO QAM x3days  Dispense: 18 tablet; Refill: 0 - May add OTC hydrocortisone for 2 days if no improvement with prednisone.   Janan Ridgeishira Tashaya Ancrum PA-C  [facility] 05/20/2014 5:39 PM

## 2014-05-24 NOTE — Progress Notes (Signed)
History and physical examinations obtained with Tishira Brewington, PA-C.  Agree with assessment and plan.

## 2014-05-29 ENCOUNTER — Telehealth: Payer: Self-pay

## 2014-05-29 NOTE — Telephone Encounter (Signed)
PA needed for Symbicort. Started on covermymeds, but need to know what other meds pt has tried for asthma. LM w/father for pt to CB.

## 2014-06-01 NOTE — Telephone Encounter (Signed)
LMOM for pt to CB.  

## 2014-08-26 ENCOUNTER — Ambulatory Visit (INDEPENDENT_AMBULATORY_CARE_PROVIDER_SITE_OTHER): Payer: Self-pay | Admitting: Internal Medicine

## 2014-08-26 VITALS — BP 124/78 | HR 81 | Temp 98.0°F | Resp 17 | Ht 65.0 in | Wt 207.0 lb

## 2014-08-26 DIAGNOSIS — J45909 Unspecified asthma, uncomplicated: Secondary | ICD-10-CM

## 2014-08-26 DIAGNOSIS — J4541 Moderate persistent asthma with (acute) exacerbation: Secondary | ICD-10-CM

## 2014-08-26 MED ORDER — ALBUTEROL SULFATE (2.5 MG/3ML) 0.083% IN NEBU
2.5000 mg | INHALATION_SOLUTION | Freq: Once | RESPIRATORY_TRACT | Status: AC
  Administered 2014-08-26: 2.5 mg via RESPIRATORY_TRACT

## 2014-08-26 MED ORDER — BUDESONIDE-FORMOTEROL FUMARATE 80-4.5 MCG/ACT IN AERO: 2.0000 | INHALATION_SPRAY | Freq: Two times a day (BID) | RESPIRATORY_TRACT | Status: DC

## 2014-08-26 MED ORDER — ALBUTEROL SULFATE (2.5 MG/3ML) 0.083% IN NEBU: 2.5000 mg | INHALATION_SOLUTION | Freq: Four times a day (QID) | RESPIRATORY_TRACT | Status: DC | PRN

## 2014-08-26 MED ORDER — IPRATROPIUM BROMIDE 0.02 % IN SOLN
0.5000 mg | Freq: Once | RESPIRATORY_TRACT | Status: AC
  Administered 2014-08-26: 0.5 mg via RESPIRATORY_TRACT

## 2014-08-26 MED ORDER — AZITHROMYCIN 250 MG PO TABS: ORAL_TABLET | ORAL | Status: DC

## 2014-08-26 MED ORDER — PREDNISONE 20 MG PO TABS: ORAL_TABLET | ORAL | Status: DC

## 2014-08-26 NOTE — Progress Notes (Signed)
Subjective:  This chart was scribed for Ellamae Sia, MD by Haywood Pao, ED Scribe at [facility].The patient was seen in exam room 11 and the patient's care was started at 8:28 AM.   Patient ID: Shannon Lopez, female    DOB: 1991/02/23, 24 y.o.   MRN: [id number] Chief Complaint  Patient presents with  . Shortness of Breath   HPI  HPI Comments: Shannon Lopez is a 24 y.o. female with a history of asthma and allergies who presents to [facility] complaining of SOB for the past week. Pt states a cold cough usually sets off her asthma. The cold began one week ago. She has wheezing, nasal discharge, trouble sleeping and ear pain when she blows her nose. She takes singular, zyrtec, albuterol and qvar. She states her albuterol and qvar hve reached the point that they are not helpful. Pt has a history of recurrent bronchitis and started with asthma symptoms in early childhood.   She has had several exacerbations over the last 6 months and a history to suggest marginal control most of the time over the past 30 days. She has had several visits with primary care in the [facility] I believe  No fever, sore throat  Patient Active Problem List   Diagnosis Date Noted  . Asthma 05/20/2014   Past Medical History  Diagnosis Date  . Allergy   . Asthma   . Morbid obesity with BMI of 40.0-44.9, adult    No past surgical history on file. No Known Allergies Prior to Admission medications   Medication Sig Start Date End Date Taking? Authorizing Provider  albuterol (PROVENTIL HFA;VENTOLIN HFA) 108 (90 BASE) MCG/ACT inhaler Inhale 2 puffs into the lungs every 4 (four) hours as needed for wheezing or shortness of breath. 05/20/14  Yes Tishira R Brewington, PA-C  cetirizine (ZYRTEC) 10 MG tablet Take 1 tablet (10 mg total) by mouth daily. 05/20/14  Yes Tishira R Brewington, PA-C  ibuprofen (ADVIL,MOTRIN) 600 MG tablet Take 1 tablet (600 mg total) by mouth every 8 (eight) hours as needed.  05/20/14  Yes Tishira R Brewington, PA-C  montelukast (SINGULAIR) 10 MG tablet Take 10 mg by mouth at bedtime.   Yes Historical Provider, MD  budesonide-formoterol (SYMBICORT) 80-4.5 MCG/ACT inhaler Inhale 2 puffs into the lungs 2 (two) times daily. Patient not taking: Reported on 08/26/2014 05/20/14  she never filled this prescription and is not taking it   Tishira R Brewington, PA-C   Review of Systems  HENT: Positive for ear pain and rhinorrhea.   Respiratory: Positive for cough and wheezing.   Psychiatric/Behavioral: Positive for sleep disturbance.   no GI or GU symptoms    Objective:  BP 124/78 mmHg  Pulse 81  Temp(Src) 98 F (36.7 C) (Oral)  Resp 17  Ht  (1.651 m)  Wt 207 lb (93.895 kg)  BMI 34.45 kg/m2  SpO2 96%  LMP 07/29/2014  Physical Exam  Constitutional: She is oriented to person, place, and time. She appears well-developed and well-nourished. No distress.  HENT:  Head: Normocephalic and atraumatic.  Right Ear: External ear normal.  Left Ear: External ear normal.  Nose: Nose normal.  Mouth/Throat: Oropharynx is clear and moist.  Eyes: Pupils are equal, round, and reactive to light.  Neck: Normal range of motion. No thyromegaly present.  Cardiovascular: Normal rate, regular rhythm and normal heart sounds.   No murmur heard. Pulmonary/Chest: Effort normal. No respiratory distress.  Wheezing throughout with inspiration and expiration.  Musculoskeletal: Normal range of motion.  Lymphadenopathy:    She has no cervical adenopathy.  Neurological: She is alert and oriented to person, place, and time.  Skin: Skin is warm and dry.  Psychiatric: She has a normal mood and affect. Her behavior is normal.  Nursing note and vitals reviewed.  First nebulization-albuterol and Atrovent  She had partial subjective and objective improvement  Second nebulization-albuterol and Atrovent 30 minutes later  She had still more improvement and post neb exam has  wheezing now only on  forced expiration     Assessment & Plan:  Asthma, unspecified asthma severity, uncomplicated--approaching  complicated phase Asthma with acute exacerbation, moderate persistent -   Plan: She is almost at maximum outpatient regimen and needs to tighten up her dosing very specifically and follow-up with Dr. Barnetta ChapelWhelan for better management.  We will change Qvar to budesonide-formoterol (SYMBICORT) 80-4.5 MCG/ACT inhaler, treat with oral prednisone, and add a home DME Nebulizer Machine for albuterol use and cover with Zithromax in case there is an infection with atypicals.  Meds ordered this encounter  Medications  . azithromycin (ZITHROMAX) 250 MG tablet    Sig: As packaged    Dispense:  6 tablet    Refill:  0  . budesonide-formoterol (SYMBICORT) 80-4.5 MCG/ACT inhaler    Sig: Inhale 2 puffs into the lungs 2 (two) times daily.    Dispense:  1 Inhaler    Refill:  5  . predniSONE (DELTASONE) 20 MG tablet    Sig: 4/3/3/2/2/1/1 single daily dose for 7 days    Dispense:  16 tablet    Refill:  0  . albuterol (PROVENTIL) (2.5 MG/3ML) 0.083% nebulizer solution    Sig: Take 3 mLs (2.5 mg total) by nebulization every 6 (six) hours as needed for wheezing or shortness of breath.    Dispense:  150 mL    Refill:  1   I have completed the patient encounter in its entirety as documented by the scribe, with editing by me where necessary. Maryjayne Kleven P. Merla Richesoolittle, M.D.

## 2015-04-19 ENCOUNTER — Ambulatory Visit (INDEPENDENT_AMBULATORY_CARE_PROVIDER_SITE_OTHER): Payer: 59 | Admitting: Physician Assistant

## 2015-04-19 VITALS — BP 122/72 | HR 84 | Temp 97.8°F | Resp 17 | Ht 65.0 in | Wt 201.0 lb

## 2015-04-19 DIAGNOSIS — J309 Allergic rhinitis, unspecified: Secondary | ICD-10-CM | POA: Diagnosis not present

## 2015-04-19 DIAGNOSIS — R062 Wheezing: Secondary | ICD-10-CM

## 2015-04-19 MED ORDER — ALBUTEROL SULFATE (2.5 MG/3ML) 0.083% IN NEBU: 2.5000 mg | INHALATION_SOLUTION | Freq: Four times a day (QID) | RESPIRATORY_TRACT | Status: DC | PRN

## 2015-04-19 MED ORDER — ALBUTEROL SULFATE HFA 108 (90 BASE) MCG/ACT IN AERS: 2.0000 | INHALATION_SPRAY | RESPIRATORY_TRACT | Status: DC | PRN

## 2015-04-19 MED ORDER — IPRATROPIUM BROMIDE 0.02 % IN SOLN: 0.5000 mg | Freq: Once | RESPIRATORY_TRACT | Status: DC

## 2015-04-19 MED ORDER — PREDNISONE 20 MG PO TABS: ORAL_TABLET | ORAL | Status: DC

## 2015-04-19 MED ORDER — FLUTICASONE PROPIONATE 50 MCG/ACT NA SUSP: 2.0000 | Freq: Every day | NASAL | Status: DC

## 2015-04-19 MED ORDER — MONTELUKAST SODIUM 10 MG PO TABS: 10.0000 mg | ORAL_TABLET | Freq: Every day | ORAL | Status: DC

## 2015-04-19 MED ORDER — BUDESONIDE-FORMOTEROL FUMARATE 160-4.5 MCG/ACT IN AERO: 2.0000 | INHALATION_SPRAY | Freq: Two times a day (BID) | RESPIRATORY_TRACT | Status: DC

## 2015-04-19 MED ORDER — ALBUTEROL SULFATE (2.5 MG/3ML) 0.083% IN NEBU: 2.5000 mg | INHALATION_SOLUTION | Freq: Once | RESPIRATORY_TRACT | Status: DC

## 2015-04-19 NOTE — Progress Notes (Signed)
Patient ID: Shannon Lopez, female    DOB: 01-07-1991, 24 y.o.   MRN: [id number]  PCP: No PCP Per Patient  Subjective:   Chief Complaint  Patient presents with  . Cough  . URI  . Shortness of Breath    HPI Presents for evaluation of cough and shortness of breath.   Her symptoms began last night with a productive cough. She has been coughing up a lot of phlegm. She thought it was occuring due to weather change. She woke up at 2 AM because she couldn't breathe. She has an albuterol nebulizer at home and tried this and it did not help.   She states that she has "chronic bronchitis" and has had it her entire life. She wheezes almost every day. She states that she had 7 asthma attacks last year. She has been seen at this clinic and in the ER for this problem several times. She has an albuterol inhaler and an albuterol nebulizer (as mentioned previously) that she uses at home for when she feels she cannot breathe. No other maintenance medications. She has seen an allergist and feels like she has "done everything" she can for this problem.   She had some sinus pressure yesterday, today it's not as bad. She has been sneezing. No sore throat, nausea, vomiting, diarrhea, constipation, or urinary symptoms. No dizziness, light-headedness or headaches. No fever or chills.    Review of Systems Constitutional: Negative for fever and chills.  HENT: Positive for sinus pressure and sneezing. Negative for sore throat.  Ears itch all of the time  Respiratory: Positive for chest tightness and shortness of breath.  Cardiovascular: Positive for chest pain (If coughing too much).  Gastrointestinal: Negative for nausea, vomiting, abdominal pain, diarrhea and constipation.  Genitourinary: Negative for dysuria.  Neurological: Negative for dizziness, syncope, light-headedness and headaches.  See HPI     Patient Active Problem List   Diagnosis Date Noted  . Asthma 05/20/2014     Prior to Admission  medications   Medication Sig Start Date End Date Taking? Authorizing Provider  albuterol (PROVENTIL HFA;VENTOLIN HFA) 108 (90 BASE) MCG/ACT inhaler Inhale 2 puffs into the lungs every 4 (four) hours as needed for wheezing or shortness of breath. Patient not taking: Reported on 04/19/2015 05/20/14   Tishira R Brewington, PA-C  albuterol (PROVENTIL) (2.5 MG/3ML) 0.083% nebulizer solution Take 3 mLs (2.5 mg total) by nebulization every 6 (six) hours as needed for wheezing or shortness of breath. Patient not taking: Reported on 04/19/2015 08/26/14   Tonye Pearson, MD  cetirizine (ZYRTEC) 10 MG tablet Take 1 tablet (10 mg total) by mouth daily. Patient not taking: Reported on 04/19/2015 05/20/14   Tishira R Brewington, PA-C     No Known Allergies     Objective:  Physical Exam  Constitutional: She is oriented to person, place, and time. Vital signs are normal. She appears well-developed and well-nourished. She is active and cooperative. No distress.  BP 122/72 mmHg  Pulse 84  Temp(Src) 97.8 F (36.6 C) (Oral)  Resp 17  Ht  (1.651 m)  Wt 201 lb (91.173 kg)  BMI 33.45 kg/m2  SpO2 95%  LMP 04/19/2015  HENT:  Head: Normocephalic and atraumatic.  Right Ear: Hearing normal.  Left Ear: Hearing normal.  Nose: Mucosal edema and rhinorrhea present.  Marked nasal "allergic crease"  Eyes: Conjunctivae are normal. No scleral icterus.  Neck: Normal range of motion. Neck supple. No thyromegaly present.  Cardiovascular: Normal rate, regular rhythm and  normal heart sounds.   Pulses:      Radial pulses are 2+ on the right side, and 2+ on the left side.  Pulmonary/Chest: Effort normal. No respiratory distress. She has no decreased breath sounds. She has wheezes (high-pitched, musical, throughout all lung fields). She has no rhonchi. She has no rales.  After albuterol + Atrovent neb, wheezing resolved and symptoms dramatically improved but subjectively not completely resolved.  Lymphadenopathy:        Head (right side): No tonsillar, no preauricular, no posterior auricular and no occipital adenopathy present.       Head (left side): No tonsillar, no preauricular, no posterior auricular and no occipital adenopathy present.    She has no cervical adenopathy.       Right: No supraclavicular adenopathy present.       Left: No supraclavicular adenopathy present.  Neurological: She is alert and oriented to person, place, and time. No sensory deficit.  Skin: Skin is warm, dry and intact. No rash noted. No cyanosis or erythema. Nails show no clubbing.  Psychiatric: She has a normal mood and affect. Her speech is normal and behavior is normal.           Assessment & Plan:   1. Wheezing She has a history of asthma, moderate persistent, in the record, but she describes it as "chronic bronchitis" not asthma.  I believe that she has been repeatedly lost to follow-up resulting in the impression that she has tried "everything," especially since she has seen specialist providers.  Counseled at length on the importance of follow-up and continuing maintenance therapy, and then returning for re-evaluation when it seems that the maintenance therapy isn't working. Plan 1 month follow-up and spirometry at that time. - albuterol (PROVENTIL) (2.5 MG/3ML) 0.083% nebulizer solution 2.5 mg; Take 3 mLs (2.5 mg total) by nebulization once. - ipratropium (ATROVENT) nebulizer solution 0.5 mg; Take 2.5 mLs (0.5 mg total) by nebulization once. - predniSONE (DELTASONE) 20 MG tablet; Take 3 PO QAM x3days, 2 PO QAM x3days, 1 PO QAM x3days  Dispense: 18 tablet; Refill: 0 - budesonide-formoterol (SYMBICORT) 160-4.5 MCG/ACT inhaler; Inhale 2 puffs into the lungs 2 (two) times daily.  Dispense: 1 Inhaler; Refill: 3 - albuterol (PROVENTIL) (2.5 MG/3ML) 0.083% nebulizer solution; Take 3 mLs (2.5 mg total) by nebulization every 6 (six) hours as needed for wheezing or shortness of breath.  Dispense: 150 mL; Refill: 1 - albuterol  (PROVENTIL HFA;VENTOLIN HFA) 108 (90 BASE) MCG/ACT inhaler; Inhale 2 puffs into the lungs every 4 (four) hours as needed for wheezing or shortness of breath.  Dispense: 1 Inhaler; Refill: 3 - montelukast (SINGULAIR) 10 MG tablet; Take 1 tablet (10 mg total) by mouth at bedtime.  Dispense: 90 tablet; Refill: 3  2. Allergic rhinitis, unspecified allergic rhinitis type Suspect this is a major driver of her reactive airway symptoms. She recalls that Singulair has worked in the past and asks to try it again. Reviewed the link between uncontrolled allergies and reactive airway symptoms. - fluticasone (FLONASE) 50 MCG/ACT nasal spray; Place 2 sprays into both nostrils daily.  Dispense: 16 g; Refill: 12 - montelukast (SINGULAIR) 10 MG tablet; Take 1 tablet (10 mg total) by mouth at bedtime.  Dispense: 90 tablet; Refill: 3   Fernande Bras, PA-C Physician Assistant-Certified [facility]

## 2015-04-19 NOTE — Progress Notes (Signed)
Subjective:     Patient ID: Shannon Lopez, female   DOB: Aug 24, 1990, 24 y.o.   MRN: [id number] PCP: No PCP Per Patient  Chief Complaint  Patient presents with  . Cough  . URI  . Shortness of Breath    HPI Patient presents today for evaluation of cough and shortness of breath.  Her symptoms began last night with a productive cough. She has been coughing up a lot of phlegm. She thought it was occuring due to weather change. She woke up at 2 AM because she couldn't breathe. She has an albuterol nebulizer at home and tried this and it did not help. She states that she has "chronic bronchitis" and has had it her entire life. She wheezes almost every day. She states that she had 7 asthma attacks last year. She has been seen at this clinic and in the ER for this problem several times. She has an albuterol inhaler and an albuterol nebulizer (as mentioned previously) that she uses at home for when she feels she cannot breathe. No other maintenance medications.  She has seen an allergist and feels like she has "done everything" she can for this problem.  She had some sinus pressure yesterday, today it's not as bad. She has been sneezing. No sore throat, nausea, vomiting, diarrhea, constipation, or urinary symptoms. No dizziness, light-headedness or headaches. No fever or chills.    Review of Systems  Constitutional: Negative for fever and chills.  HENT: Positive for sinus pressure and sneezing. Negative for sore throat.        Ears itch all of the time  Respiratory: Positive for chest tightness and shortness of breath.   Cardiovascular: Positive for chest pain (If coughing too much).  Gastrointestinal: Negative for nausea, vomiting, abdominal pain, diarrhea and constipation.  Genitourinary: Negative for dysuria.  Neurological: Negative for dizziness, syncope, light-headedness and headaches.  See HPI   Patient Active Problem List   Diagnosis Date Noted  . Asthma 05/20/2014    Prior to  Admission medications   Medication Sig Start Date End Date Taking? Authorizing Provider  albuterol (PROVENTIL HFA;VENTOLIN HFA) 108 (90 BASE) MCG/ACT inhaler Inhale 2 puffs into the lungs every 4 (four) hours as needed for wheezing or shortness of breath. Patient not taking: Reported on 04/19/2015 05/20/14   Tishira R Brewington, PA-C  albuterol (PROVENTIL) (2.5 MG/3ML) 0.083% nebulizer solution Take 3 mLs (2.5 mg total) by nebulization every 6 (six) hours as needed for wheezing or shortness of breath. Patient not taking: Reported on 04/19/2015 08/26/14   Tonye Pearson, MD  cetirizine (ZYRTEC) 10 MG tablet Take 1 tablet (10 mg total) by mouth daily. Patient not taking: Reported on 04/19/2015 05/20/14   Tishira R Brewington, PA-C    No Known Allergies    Objective:  Physical Exam  Constitutional: She is oriented to person, place, and time. She appears well-developed and well-nourished.  HENT:  Head: Normocephalic and atraumatic.  Right Ear: Tympanic membrane, external ear and ear canal normal.  Left Ear: Tympanic membrane, external ear and ear canal normal.  Nose: Nose normal.  Mouth/Throat: Uvula is midline, oropharynx is clear and moist and mucous membranes are normal.  Eyes: Conjunctivae are normal. Pupils are equal, round, and reactive to light.  Neck: Normal range of motion. Neck supple.  Cardiovascular: Normal rate and regular rhythm.   Pulmonary/Chest: She has wheezes (Diffuse inspiratory wheezes throughout all lung fields.).  Abdominal: Soft. Bowel sounds are normal.  Lymphadenopathy:    She has no  cervical adenopathy.  Neurological: She is alert and oriented to person, place, and time.  Skin: Skin is warm and dry.  Psychiatric: She has a normal mood and affect. Her behavior is normal. Thought content normal.    BP 122/72 mmHg  Pulse 84  Temp(Src) 97.8 F (36.6 C) (Oral)  Resp 17  Ht  (1.651 m)  Wt 201 lb (91.173 kg)  BMI 33.45 kg/m2  SpO2 95%  LMP  04/19/2015   Post-nebulizer lung exam showed marked improvement in wheezing.   Assessment & Plan:  1. Wheezing Nebulizer given in office.  Prednisone taper.  For maintenance treatment: Symbicort and Singulair For rescue: Albuterol inhaler/nebulizer Counseled on importance of diligent maintenance control as well as monitoring of need for rescue medications so that maintenance regimen can be monitored for effectiveness.  - albuterol (PROVENTIL) (2.5 MG/3ML) 0.083% nebulizer solution 2.5 mg; Take 3 mLs (2.5 mg total) by nebulization once. - ipratropium (ATROVENT) nebulizer solution 0.5 mg; Take 2.5 mLs (0.5 mg total) by nebulization once. - predniSONE (DELTASONE) 20 MG tablet; Take 3 PO QAM x3days, 2 PO QAM x3days, 1 PO QAM x3days  Dispense: 18 tablet; Refill: 0 - budesonide-formoterol (SYMBICORT) 160-4.5 MCG/ACT inhaler; Inhale 2 puffs into the lungs 2 (two) times daily.  Dispense: 1 Inhaler; Refill: 3 - albuterol (PROVENTIL) (2.5 MG/3ML) 0.083% nebulizer solution; Take 3 mLs (2.5 mg total) by nebulization every 6 (six) hours as needed for wheezing or shortness of breath.  Dispense: 150 mL; Refill: 1 - albuterol (PROVENTIL HFA;VENTOLIN HFA) 108 (90 BASE) MCG/ACT inhaler; Inhale 2 puffs into the lungs every 4 (four) hours as needed for wheezing or shortness of breath.  Dispense: 1 Inhaler; Refill: 3 - montelukast (SINGULAIR) 10 MG tablet; Take 1 tablet (10 mg total) by mouth at bedtime.  Dispense: 90 tablet; Refill: 3  2. Allergic rhinitis, unspecified allergic rhinitis type Flonase and daily anti-histamine like Zyrtec, Allegra, or Claritin for allergy control. Her allergies are contributing to the wheezing.  - fluticasone (FLONASE) 50 MCG/ACT nasal spray; Place 2 sprays into both nostrils daily.  Dispense: 16 g; Refill: 12 - montelukast (SINGULAIR) 10 MG tablet; Take 1 tablet (10 mg total) by mouth at bedtime.  Dispense: 90 tablet; Refill: 3   Return in one month (around 05/19/2015) for  follow-up so that the above regimen can be monitored and adjusted if needed.   Amber D. Race, PA-S Physician Assistant Student [facility]

## 2015-04-19 NOTE — Patient Instructions (Addendum)
Having asthma or chronic bronchitis is difficult. It requires constant vigilance, and usually maintenance treatment. It is extremely important to follow-up regularly and when it seems that the treatment you are on is not working.   In addition to the medications prescribed, please take a daily oral anti-histamine like Zyrtec, Claritin, or Allegra. Please also use your albuterol inhaler or nebulizer as needed for cough, shortness of breath, or wheezing. Keep a record of when you use the albuterol as a way of monitoring how well the maintenance treatment is working.

## 2015-05-11 ENCOUNTER — Emergency Department (HOSPITAL_COMMUNITY)
Admission: EM | Admit: 2015-05-11 | Discharge: 2015-05-11 | Disposition: A | Discharge status: Home / Self Care | Payer: 59 | Attending: Emergency Medicine | Admitting: Emergency Medicine

## 2015-05-11 DIAGNOSIS — Z7951 Long term (current) use of inhaled steroids: Secondary | ICD-10-CM | POA: Diagnosis not present

## 2015-05-11 DIAGNOSIS — J45909 Unspecified asthma, uncomplicated: Secondary | ICD-10-CM | POA: Diagnosis not present

## 2015-05-11 DIAGNOSIS — Z79899 Other long term (current) drug therapy: Secondary | ICD-10-CM | POA: Insufficient documentation

## 2015-05-11 DIAGNOSIS — M542 Cervicalgia: Secondary | ICD-10-CM | POA: Diagnosis present

## 2015-05-11 DIAGNOSIS — M436 Torticollis: Secondary | ICD-10-CM

## 2015-05-11 DIAGNOSIS — Z87891 Personal history of nicotine dependence: Secondary | ICD-10-CM | POA: Diagnosis not present

## 2015-05-11 MED ORDER — IBUPROFEN 600 MG PO TABS: 600.0000 mg | ORAL_TABLET | Freq: Four times a day (QID) | ORAL | Status: DC | PRN

## 2015-05-11 MED ORDER — METHOCARBAMOL 500 MG PO TABS: 500.0000 mg | ORAL_TABLET | Freq: Two times a day (BID) | ORAL | Status: DC

## 2015-05-11 MED ORDER — DIAZEPAM 5 MG/ML IJ SOLN
10.0000 mg | Freq: Once | INTRAMUSCULAR | Status: AC
  Administered 2015-05-11: 10 mg via INTRAMUSCULAR
  Filled 2015-05-11: qty 2

## 2015-05-11 MED ORDER — KETOROLAC TROMETHAMINE 30 MG/ML IJ SOLN
30.0000 mg | Freq: Once | INTRAMUSCULAR | Status: AC
  Administered 2015-05-11: 30 mg via INTRAMUSCULAR
  Filled 2015-05-11: qty 1

## 2015-05-11 NOTE — ED Notes (Signed)
Pt states her pain has decreased a small amount

## 2015-05-11 NOTE — ED Notes (Signed)
Pt states she woke up yesterday with neck pain that has progressively gotten worse and is radiating down her arms and into her back.

## 2015-05-11 NOTE — ED Notes (Signed)
Bed: [id number] Expected date:  Expected time:  Means of arrival:  Comments:  [id number] wall pain

## 2015-05-11 NOTE — ED Provider Notes (Signed)
CSN: [id number]     Arrival date & time 05/11/15  0158 History   First MD Initiated Contact with Patient 05/11/15 [phone]     Chief Complaint  Patient presents with  . Neck Pain     (Consider location/radiation/quality/duration/timing/severity/associated sxs/prior Treatment) HPI Comments: Woke yesterday morning with neck pain that has gotten worse over the past 18 hours  Has take 2 doses of methelcarbonate without relief  Patient is a 24 y.o. female presenting with neck pain. The history is provided by the patient.  Neck Pain Pain location:  Generalized neck Quality:  Aching Pain radiates to:  L shoulder, R shoulder, L scapula and R scapula Pain severity:  Moderate Pain is:  Same all the time Onset quality:  Gradual Duration:  1 day Timing:  Constant Progression:  Worsening Chronicity:  New Context: not fall, not jumping from heights, not lifting a heavy object, not MCA, not MVA, not pedestrian accident and not recent injury   Relieved by:  Nothing Worsened by:  Twisting Ineffective treatments: methelcarbonate. Associated symptoms: no chest pain, no fever, no headaches, no numbness and no weakness     Past Medical History  Diagnosis Date  . Allergy   . Asthma   . Morbid obesity with BMI of 40.0-44.9, adult    No past surgical history on file. Family History  Problem Relation Age of Onset  . Hyperlipidemia Father   . Heart disease Father   . Diabetes Maternal Grandmother     both sides of the family  . Diabetes Father   . Irritable bowel syndrome Father   . Irritable bowel syndrome Sister    Social History  Substance Use Topics  . Smoking status: Former Smoker    Types: Cigarettes    Quit date: 02/17/2014  . Smokeless tobacco: Never Used  . Alcohol Use: No   OB History    No data available     Review of Systems  Constitutional: Negative for fever.  HENT: Negative for congestion and ear pain.   Respiratory: Negative for cough and shortness of breath.     Cardiovascular: Negative for chest pain.  Musculoskeletal: Positive for neck pain.  Skin: Negative for rash and wound.  Neurological: Negative for dizziness, weakness, numbness and headaches.  All other systems reviewed and are negative.     Allergies  Review of patient's allergies indicates no known allergies.  Home Medications   Prior to Admission medications   Medication Sig Start Date End Date Taking? Authorizing Provider  albuterol (PROVENTIL HFA;VENTOLIN HFA) 108 (90 BASE) MCG/ACT inhaler Inhale 2 puffs into the lungs every 4 (four) hours as needed for wheezing or shortness of breath. 04/19/15  Yes Chelle Jeffery, PA-C  albuterol (PROVENTIL) (2.5 MG/3ML) 0.083% nebulizer solution Take 3 mLs (2.5 mg total) by nebulization every 6 (six) hours as needed for wheezing or shortness of breath. 04/19/15  Yes Chelle Jeffery, PA-C  budesonide-formoterol (SYMBICORT) 160-4.5 MCG/ACT inhaler Inhale 2 puffs into the lungs 2 (two) times daily. 04/19/15  Yes Chelle Jeffery, PA-C  fluticasone (FLONASE) 50 MCG/ACT nasal spray Place 2 sprays into both nostrils daily. 04/19/15  Yes Chelle Jeffery, PA-C  montelukast (SINGULAIR) 10 MG tablet Take 1 tablet (10 mg total) by mouth at bedtime. 04/19/15  Yes Chelle Jeffery, PA-C  cetirizine (ZYRTEC) 10 MG tablet Take 1 tablet (10 mg total) by mouth daily. Patient not taking: Reported on 04/19/2015 05/20/14   Tishira R Brewington, PA-C  ibuprofen (ADVIL,MOTRIN) 600 MG tablet Take 1 tablet (600 mg  total) by mouth every 6 (six) hours as needed. 05/11/15   Earley Favor, NP  methocarbamol (ROBAXIN) 500 MG tablet Take 1 tablet (500 mg total) by mouth 2 (two) times daily. 05/11/15   Earley Favor, NP  predniSONE (DELTASONE) 20 MG tablet Take 3 PO QAM x3days, 2 PO QAM x3days, 1 PO QAM x3days Patient not taking: Reported on 05/11/2015 04/19/15   Chelle Jeffery, PA-C   BP 125/68 mmHg  Pulse 80  Temp(Src) 98.8 F (37.1 C) (Oral)  Resp 16  Ht  (1.651 m)  Wt 197 lb  (89.359 kg)  BMI 32.78 kg/m2  SpO2 99%  LMP 04/19/2015 Physical Exam  Constitutional: She appears well-developed and well-nourished.  HENT:  Head: Normocephalic.  Mouth/Throat: Oropharynx is clear and moist.  Eyes: Pupils are equal, round, and reactive to light.  Neck: Muscular tenderness present. No spinous process tenderness present.    Cardiovascular: Normal rate.   Pulmonary/Chest: Effort normal.  Musculoskeletal: Normal range of motion.  Neurological: She is alert.  Skin: Skin is warm. No rash noted. No erythema.  Nursing note and vitals reviewed.   ED Course  Procedures (including critical care time) Labs Review Labs Reviewed - No data to display  Imaging Review No results found. I have personally reviewed and evaluated these images and lab results as part of my medical decision-making.   EKG Interpretation None    better after medication will DC home with Rx fro Ibuprofen and Robaxin   MDM   Final diagnoses:  Torticollis, acute         Earley Favor, NP 05/11/15 1610  Dione Booze, MD 05/11/15 [phone]

## 2015-05-11 NOTE — Discharge Instructions (Signed)
Acute Torticollis Torticollis is a condition in which the muscles of the neck tighten (contract) abnormally, causing the neck to twist and the head to move into an unnatural position. Torticollis that develops suddenly is called acute torticollis. If torticollis becomes chronic and is left untreated, the face and neck can become deformed. CAUSES This condition may be caused by:  Sleeping in an awkward position (common).  Extending or twisting the neck muscles beyond their normal position.  Infection. In some cases, the cause may not be known. SYMPTOMS Symptoms of this condition include:  An unnatural position of the head.  Neck pain.  A limited ability to move the neck.  Twisting of the neck to one side. DIAGNOSIS This condition is diagnosed with a physical exam. You may also have imaging tests, such as an X-ray, CT scan, or MRI. TREATMENT Treatment for this condition involves trying to relax the neck muscles. It may include:  Medicines or shots.  Physical therapy.  Surgery. This may be done in severe cases. HOME CARE INSTRUCTIONS  Take medicines only as directed by your health care provider.  Do stretching exercises and massage your neck as directed by your health care provider.  Keep all follow-up visits as directed by your health care provider. This is important. SEEK MEDICAL CARE IF:  You develop a fever. SEEK IMMEDIATE MEDICAL CARE IF:  You develop difficulty breathing.  You develop noisy breathing (stridor).  You start drooling.  You have trouble swallowing or have pain with swallowing.  You develop numbness or weakness in your hands or feet.  You have changes in your speech, understanding, or vision.  Your pain gets worse.   This information is not intended to replace advice given to you by your health care provider. Make sure you discuss any questions you have with your health care provider.   Document Released: 07/03/2000 Document Revised:  11/20/2014 Document Reviewed: 07/02/2014 Elsevier Interactive Patient Education 2016 [employer].  Emergency Department Resource Guide 1) Find a Doctor and Pay Out of Pocket Although you won't have to find out who is covered by your insurance plan, it is a good idea to ask around and get recommendations. You will then need to call the office and see if the doctor you have chosen will accept you as a new patient and what types of options they offer for patients who are self-pay. Some doctors offer discounts or will set up payment plans for their patients who do not have insurance, but you will need to ask so you aren't surprised when you get to your appointment.  2) Contact Your Local Health Department Not all health departments have doctors that can see patients for sick visits, but many do, so it is worth a call to see if yours does. If you don't know where your local health department is, you can check in your phone book. The CDC also has a tool to help you locate your state's health department, and many state websites also have listings of all of their local health departments.  3) Find a Walk-in Clinic If your illness is not likely to be very severe or complicated, you may want to try a walk in clinic. These are popping up all over the country in pharmacies, drugstores, and shopping centers. They're usually staffed by nurse practitioners or physician assistants that have been trained to treat common illnesses and complaints. They're usually fairly quick and inexpensive. However, if you have serious medical issues or chronic medical problems, these are  probably not your best option.  No Primary Care Doctor: - Call Health Connect at  [phone] - they can help you locate a primary care doctor that  accepts your insurance, provides certain services, etc. - Physician Referral Service- [phone]  Chronic Pain Problems: Organization         Address  Phone   Notes  [facility]  [phone] Patients need to be referred by their primary care doctor.   Medication Assistance: Organization         Address  Phone   Notes  [facility] Medication [facility] [address] [place], [place] [address] [phone] --Must be a resident of [facility] [place] -- Must have NO insurance coverage whatsoever (no Medicaid/ Medicare, etc.) -- The pt. MUST have a primary care doctor that directs their care regularly and follows them in the community   [facility]ist  [phone]) [phone]   [employer]  [phone]    Agencies that provide inexpensive medical care: Organization         Address  Phone   Notes  [facility]  [phone]   [facility]    [phone]   [facility] [address] [place], [place] [address] [phone]) [phone]   [facility] [address], [place] [phone]   Planned Parenthood    [phone]   [facility]    [phone]   [facility]  [address], [place] Phone:  [phone], Fax:  [phone] Hours of Operation:  9 am - 6 pm, M-F.  Also accepts Medicaid/Medicare and self-pay.  [facility]  [address], [place] Phone: [phone], Fax: [phone]) [phone]. Hours of Operation:  8:30 am - 5:30 pm, M-F.  Also accepts Medicaid and self-pay.  [facility] [place] [address], [place] Phone: [phone]) [phone]   [facility] [address], [place], [place] [phone], Ext. [phone] Mondays & Thursdays: 7-9 AM.  First 15 patients are seen on a first come, first serve basis.    Medicaid-accepting [facility] Providers:  Organization         Address  Phone   Notes  [facility] [address], [place] [phone] Also accepts self-pay patients.  [facility] [address],  [place]  [phone]   [facility] [address], [place] [phone]) [phone]   [facility] [address] [place] [address], [place] [phone]   Renaye RakersVeita Bland [address], [place]   [phone] Only accepts [place] Access [place] patients after they have their name applied to their card.   Self-Pay (no insurance) in [facility] [place]:  Organization         Address  Phone   Notes  Sickle Cell Patients, [facility] [address], [place] [phone]   [facility] [address], [place] [phone]) [phone]   [facility] [place]  [address], [place] [phone]   [facility]/Dr. Osei-Bonsu  [address] [place] [address], [place] or [phone], [place] [phone] Phone number for both [place] and [place] locations is the same.  [facility]  [address], [place] [phone]   [facility] [address] [place] or [address] [phone]   [facility] [address], [place] [phone], phone; [phone], fax Sees patients 1st and 3rd Saturday of every month.  Must not qualify for public or private insurance (i.e. Medicaid, Medicare, [place] Health Choice, Veterans' Benefits)  Household income should be no more than 200% of the poverty level The clinic cannot treat you if you are pregnant or think you are pregnant  Sexually transmitted diseases are not treated at the clinic.    Dental Care: Organization         Address  Phone  Notes  [facility] [address] [place], [place] [phone] Accepts children up to age 24 who are enrolled in [place] or [place] Health Choice; pregnant women with a Medicaid card; and children who have applied for Medicaid or [facility]  Choice, but were declined, whose parents can pay a reduced fee at time of service.  [facility] of [facility]  [address], [place] [phone] Accepts children up to age 84 who are enrolled in [place] or [place] Health Choice; pregnant women with a Medicaid card; and children who have applied for Medicaid or [place] Health Choice, but were declined, whose parents can pay a reduced fee at time of service.  [facility]  [address] [place], [place] [phone] Patients are seen by appointment only. Walk-ins are not accepted. [facility] will see patients 46 years of age and older. Monday - Tuesday (8am-5pm) Most Wednesdays (8:30-5pm) $30 per visit, cash only  [facility] Adult [facility] Access [facility]  [address], [facility] [phone] Patients are seen by appointment only. Walk-ins are not accepted. [facility] will see patients 52 years of age and older. One Wednesday Evening (Monthly: Volunteer Based).  $30 per visit, cash only  [employer]  [phone] for adults; Children under age 109, call Graduate Pediatric Dentistry at [phone]. Children aged 75-14, please call [phone] to request a pediatric application.  Dental services are provided in all areas of dental care including fillings, crowns and bridges, complete and partial dentures, implants, gum treatment, root canals, and extractions. Preventive care is also provided. Treatment is provided to both adults and children. Patients are selected via a lottery and there is often a waiting list.   [facility] [address] [place]  [phone] [email or web address]   [facility] [address] [place], [place] [phone], Ext. [phone] Second and Fourth Thursday of each month, opens at 6:30 AM; Clinic ends at 9 AM.  Patients are seen on a first-come first-served basis, and a limited number are seen during each  clinic.   [facility]  [address] [place], [place] [phone]   Eligibility Requirements You must have lived in [place], [place], or [place] counties for at least the last three months.   You cannot be eligible for state or federal sponsored [place], including [employer], [place], or [employer].   You generally cannot be eligible for healthcare insurance through your employer.    How to apply: Eligibility screenings are held every Tuesday and Wednesday afternoon from 1:00 pm until 4:00 pm. You do not need an appointment for the interview!  [facility]  [address], [place], [place] [phone]   [facility] Health Department  [phone]   [facility] Health Department  [phone]   [facility] Health Department  [phone]    Behavioral Health Resources in the Community: Intensive Outpatient Programs Organization         Address  Phone  Notes  [facility] Services [address], [place], [place] [phone]   [facility] [address], [place], [place] [phone]   [facility] [address], [place], [place]  [phone]   [facility] [address],  [place], [place] [phone] or [phone]   Substance Abuse Resources Organization         Address  Phone  Notes  [facility] Drug Services  [phone]   [facility]  [phone]   [facility]  [phone]   Floydene Flock  [phone]   Residential & Outpatient Substance Abuse Program  [phone]   Psychological Services Organization         Address  Phone  Notes  [facility]  [phone]   [facility] Services  [phone]   [facility] Mental Health [address] [place] [phone] or [phone]    Mobile Crisis Teams Organization         Address  Phone  Notes  [facility], Mobile  Crisis Care Unit  [phone]   [facility]  [address] [place], [place] [phone]   Doristine Locks [address] [place] [phone]    Self-Help/Support Groups Organization         Address  Phone             Notes  [facility] [place] - variety of support groups  [phone]- [phone] Call for more information  Narcotics Anonymous (NA), [facility] [address], [employer] [place]  2 meetings at this location   Statistician         Address  Phone  Notes  [facility] [address],    [place]  [phone]   [facility]  [address], [place] [address], [place], [place] [phone]   [facility] [address] [place], [place] [phone] Admissions: 8am-3pm M-F  [facility] [address],    [place], [place] [phone]   [facility] [address] [place], [place], [place] [phone]   [facility] [address]  [place], [place] [phone]   [facility] - Intensive Outpatient [address], [place], [place] [phone]   [facility] ([facility] [address]  [place], [place] [phone] or [phone]   [facility] ([facility]) [address] [place], [place] [phone] Accepts Medicaid  [facility] [place] [address]  [place] [phone] Substance Abuse/Addiction Treatment   [facility] Organization         Address  Phone  Notes  [facility]  [phone]   Angie Fava, PhD [address] [place], [place]   [phone] or [phone]   [facility] Behavioral   [address] [place], [place] [phone]   [facility] [address] [place], [place] [phone] Insurance/Medicaid/sponsorship through [employer] and Families [address]  [place], [place] [phone] [place], [place] [phone]    Dr. Adele Schilder  [phone]   [facility] Dept. 1) [address], [place] 2) [place] 3)  [place] [address], [place] [phone]  [phone]   [place] [phone] or [phone] (After Hours)

## 2015-05-14 ENCOUNTER — Encounter: Payer: Self-pay | Admitting: Physician Assistant

## 2015-05-14 ENCOUNTER — Ambulatory Visit (INDEPENDENT_AMBULATORY_CARE_PROVIDER_SITE_OTHER): Payer: 59 | Admitting: Physician Assistant

## 2015-05-14 VITALS — BP 116/80 | HR 73 | Temp 98.3°F | Resp 16 | Ht 65.0 in | Wt 211.0 lb

## 2015-05-14 DIAGNOSIS — F329 Major depressive disorder, single episode, unspecified: Secondary | ICD-10-CM

## 2015-05-14 DIAGNOSIS — J309 Allergic rhinitis, unspecified: Secondary | ICD-10-CM | POA: Diagnosis not present

## 2015-05-14 DIAGNOSIS — J45909 Unspecified asthma, uncomplicated: Secondary | ICD-10-CM | POA: Diagnosis not present

## 2015-05-14 DIAGNOSIS — F32A Depression, unspecified: Secondary | ICD-10-CM

## 2015-05-14 LAB — CBC WITH DIFFERENTIAL/PLATELET
BASOS ABS: 0.1 10*3/uL (ref 0.0–0.1)
Basophils Relative: 1 % (ref 0–1)
Eosinophils Absolute: 1.1 10*3/uL — ABNORMAL HIGH (ref 0.0–0.7)
Eosinophils Relative: 18 % — ABNORMAL HIGH (ref 0–5)
HEMATOCRIT: 42.5 % (ref 36.0–46.0)
HEMOGLOBIN: 14.7 g/dL (ref 12.0–15.0)
LYMPHS PCT: 35 % (ref 12–46)
Lymphs Abs: 2.1 10*3/uL (ref 0.7–4.0)
MCH: 29.2 pg (ref 26.0–34.0)
MCHC: 34.6 g/dL (ref 30.0–36.0)
MCV: 84.5 fL (ref 78.0–100.0)
MPV: 10.6 fL (ref 8.6–12.4)
Monocytes Absolute: 0.5 10*3/uL (ref 0.1–1.0)
Monocytes Relative: 9 % (ref 3–12)
Neutro Abs: 2.3 10*3/uL (ref 1.7–7.7)
Neutrophils Relative %: 37 % — ABNORMAL LOW (ref 43–77)
Platelets: 288 10*3/uL (ref 150–400)
RBC: 5.03 MIL/uL (ref 3.87–5.11)
RDW: 14.6 % (ref 11.5–15.5)
WBC: 6.1 10*3/uL (ref 4.0–10.5)

## 2015-05-14 LAB — TSH: TSH: 1.436 u[IU]/mL (ref 0.350–4.500)

## 2015-05-14 MED ORDER — SERTRALINE HCL 50 MG PO TABS: 50.0000 mg | ORAL_TABLET | Freq: Every day | ORAL | Status: DC

## 2015-05-14 NOTE — Patient Instructions (Addendum)
Make an appointment at the counseling center.  Consider getting a flu vaccine. You may return here, or get it at the [facility].  Start the Zoloft (sertraline) by taking 1/2 tablet each morning for the first week, then increase to the whole tablet each morning.

## 2015-05-14 NOTE — Progress Notes (Signed)
Patient ID: Shannon Lopez, female    DOB: 1990/08/31, 24 y.o.   MRN: [id number]  PCP: No PCP Per Patient  Subjective:   Chief Complaint  Patient presents with  . Follow-up  . Asthma    "Better"  . Diagnosed with acute Torticollis    x 2 days    HPI Presents for evaluation of asthma.  Last month she was seen for an acute exacerbation of asthma, treated with oral prednisone taper, albuterol and Atrovent.  She was counseled on the importance of using her maintenance medication consistently to prevent exacerbations.  She is doing much better, reporting only needed albuterol 1-2 times in the past month. She is taking Benadryl BID rather than cetirizine.  Her primary issue today is anxiety and depression.  She reports feeling this way for "years" but that it's been much worse over the past few weeks. Feels like she doesn't want to get out of bed, "that I could just stay in bed for 2 weeks straight." Thought that most people felt that way, but recently spent some time with a friend who awoke each morning bright and cheery, ready for the new day, and realized that there might be something wrong with her. Her sister has depression, but she doesn't know if she takes any medication for it. Notes that no matter when she gets up, she's late for her 10 am class. "I know the professor hates me."  She was seen in the ED two days ago with neck pain, diagnosed with torticollis.  Depression screen PHQ 2/9 05/14/2015  Decreased Interest 3  Down, Depressed, Hopeless 2  PHQ - 2 Score 5  Altered sleeping 3  Tired, decreased energy 3  Change in appetite 3  Feeling bad or failure about yourself  3  Trouble concentrating 3  Moving slowly or fidgety/restless 2  Suicidal thoughts 2  PHQ-9 Score 24  Difficult doing work/chores Extremely dIfficult      Review of Systems     Patient Active Problem List   Diagnosis Date Noted  . Asthma 05/20/2014     Prior to Admission medications     Medication Sig Start Date End Date Taking? Authorizing Provider  albuterol (PROVENTIL HFA;VENTOLIN HFA) 108 (90 BASE) MCG/ACT inhaler Inhale 2 puffs into the lungs every 4 (four) hours as needed for wheezing or shortness of breath. 04/19/15  Yes Pecola Haxton, PA-C  albuterol (PROVENTIL) (2.5 MG/3ML) 0.083% nebulizer solution Take 3 mLs (2.5 mg total) by nebulization every 6 (six) hours as needed for wheezing or shortness of breath. 04/19/15  Yes Yeslin Delio, PA-C  budesonide-formoterol (SYMBICORT) 160-4.5 MCG/ACT inhaler Inhale 2 puffs into the lungs 2 (two) times daily. 04/19/15  Yes Hubert Derstine, PA-C  fluticasone (FLONASE) 50 MCG/ACT nasal spray Place 2 sprays into both nostrils daily. 04/19/15  Yes Gaylon Melchor, PA-C  ibuprofen (ADVIL,MOTRIN) 600 MG tablet Take 1 tablet (600 mg total) by mouth every 6 (six) hours as needed. 05/11/15  Yes Earley Favor, NP  methocarbamol (ROBAXIN) 500 MG tablet Take 1 tablet (500 mg total) by mouth 2 (two) times daily. 05/11/15  Yes Earley Favor, NP  montelukast (SINGULAIR) 10 MG tablet Take 1 tablet (10 mg total) by mouth at bedtime. 04/19/15  Yes Muath Hallam, PA-C  cetirizine (ZYRTEC) 10 MG tablet Take 1 tablet (10 mg total) by mouth daily. Patient not taking: Reported on 05/14/2015 05/20/14   Tishira R Brewington, PA-C     No Known Allergies     Objective:  Physical  Exam  Constitutional: She is oriented to person, place, and time. Vital signs are normal. She appears well-developed and well-nourished. She is active and cooperative. No distress.  BP 116/80 mmHg  Pulse 73  Temp(Src) 98.3 F (36.8 C) (Oral)  Resp 16  Ht 5\' 5"  (1.651 m)  Wt 211 lb (95.709 kg)  BMI 35.11 kg/m2  SpO2 99%  LMP 04/17/2015  HENT:  Head: Normocephalic and atraumatic.  Right Ear: Hearing normal.  Left Ear: Hearing normal.  Eyes: Conjunctivae are normal. No scleral icterus.  Neck: Normal range of motion. Neck supple. No thyromegaly present.  Cardiovascular: Normal  rate, regular rhythm and normal heart sounds.   Pulses:      Radial pulses are 2+ on the right side, and 2+ on the left side.  Pulmonary/Chest: Effort normal and breath sounds normal.  Lymphadenopathy:       Head (right side): No tonsillar, no preauricular, no posterior auricular and no occipital adenopathy present.       Head (left side): No tonsillar, no preauricular, no posterior auricular and no occipital adenopathy present.    She has no cervical adenopathy.       Right: No supraclavicular adenopathy present.       Left: No supraclavicular adenopathy present.  Neurological: She is alert and oriented to person, place, and time. No sensory deficit.  Skin: Skin is warm, dry and intact. No rash noted. No cyanosis or erythema. Nails show no clubbing.  Psychiatric: Her speech is normal and behavior is normal. Judgment and thought content normal. Her mood appears not anxious. Her affect is not angry, not blunt, not labile and not inappropriate. Cognition and memory are normal. She exhibits a depressed mood (crying during interview; appropriate affect.). She expresses no homicidal and no suicidal ideation.           Assessment & Plan:   1. Asthma, unspecified asthma severity, uncomplicated 2. Allergic rhinitis, unspecified allergic rhinitis type Much improved. Continue current treatment.   3. Depression Start SSRI. Consider addition of Wellbutrin if needed. Encouraged her to schedule an appointment at the counseling center. - CBC with Differential/Platelet - TSH - sertraline (ZOLOFT) 50 MG tablet; Take 1 tablet (50 mg total) by mouth daily.  Dispense: 30 tablet; Refill: 3  Encouraged her to get a flu vaccine at her earliest convenience. Her mother doesn't recommend them, so she wants to do her own research.  Return in about 4 weeks (around 06/11/2015).   Fernande Brashelle S. Worley Radermacher, PA-C Physician Assistant-Certified [facility]

## 2015-06-18 ENCOUNTER — Ambulatory Visit (INDEPENDENT_AMBULATORY_CARE_PROVIDER_SITE_OTHER): Payer: 59 | Admitting: Physician Assistant

## 2015-06-18 ENCOUNTER — Encounter: Payer: Self-pay | Admitting: Physician Assistant

## 2015-06-18 VITALS — BP 125/80 | HR 54 | Temp 97.9°F | Resp 16 | Ht 64.5 in | Wt 202.8 lb

## 2015-06-18 DIAGNOSIS — F32A Depression, unspecified: Secondary | ICD-10-CM

## 2015-06-18 DIAGNOSIS — F4323 Adjustment disorder with mixed anxiety and depressed mood: Secondary | ICD-10-CM

## 2015-06-18 DIAGNOSIS — F329 Major depressive disorder, single episode, unspecified: Secondary | ICD-10-CM | POA: Insufficient documentation

## 2015-06-18 HISTORY — DX: Depression, unspecified: F32.A

## 2015-06-18 MED ORDER — BUPROPION HCL ER (XL) 150 MG PO TB24: 150.0000 mg | ORAL_TABLET | Freq: Every day | ORAL | Status: DC

## 2015-06-18 NOTE — Progress Notes (Signed)
Patient ID: Shannon Lopez, female    DOB: 1991-07-18, 24 y.o.   MRN: [id number]  PCP: No PCP Per Patient  Subjective:   Chief Complaint  Patient presents with  . Follow-up    medication ZOLOFT  . Asthma  . Depression    see last answer on depression scale    HPI Presents for evaluation of depression and anxiety.  4 weeks ago she was seen for asthma evaluation and noted to be depressed. She was started on sertraline at that time.  "Super distracted and unmotivated." Took 5 hours to get out of bed and get ready to come here today. Suicidal ideations, no plan or intent. Anxiety is much better than before. Sleeping more than before.  Asthma remains well controlled. She's very pleased with how that is going.  Depression screen [facility] 2/9 06/18/2015 05/14/2015  Decreased Interest 3 3  Down, Depressed, Hopeless 3 2  PHQ - 2 Score 6 5  Altered sleeping 3 3  Tired, decreased energy 3 3  Change in appetite 3 3  Feeling bad or failure about yourself  3 3  Trouble concentrating 3 3  Moving slowly or fidgety/restless 2 2  Suicidal thoughts 3 2  PHQ-9 Score 26 24  Difficult doing work/chores - Extremely dIfficult      Review of Systems  Constitutional: Positive for activity change, appetite change and fatigue. Negative for fever, chills and diaphoresis.  Eyes: Negative for redness and visual disturbance.  Respiratory: Negative for cough, chest tightness and shortness of breath.   Cardiovascular: Negative for chest pain, palpitations and leg swelling.  Gastrointestinal: Positive for nausea (with initiating sertraline, now resolved).  Neurological: Negative for dizziness, weakness and headaches.  Hematological: Negative for adenopathy.  Psychiatric/Behavioral: Positive for suicidal ideas, dysphoric mood and decreased concentration. Negative for hallucinations, behavioral problems, confusion, self-injury and agitation. Sleep disturbance: increased somnolence. The patient is not  nervous/anxious (much better on Zoloft) and is not hyperactive.        Patient Active Problem List   Diagnosis Date Noted  . Depression 06/18/2015  . Asthma 05/20/2014     Prior to Admission medications   Medication Sig Start Date End Date Taking? Authorizing Provider  albuterol (PROVENTIL HFA;VENTOLIN HFA) 108 (90 BASE) MCG/ACT inhaler Inhale 2 puffs into the lungs every 4 (four) hours as needed for wheezing or shortness of breath. 04/19/15  Yes Messiah Rovira, PA-C  albuterol (PROVENTIL) (2.5 MG/3ML) 0.083% nebulizer solution Take 3 mLs (2.5 mg total) by nebulization every 6 (six) hours as needed for wheezing or shortness of breath. 04/19/15  Yes Kayron Kalmar, PA-C  budesonide-formoterol (SYMBICORT) 160-4.5 MCG/ACT inhaler Inhale 2 puffs into the lungs 2 (two) times daily. 04/19/15  Yes Laila Myhre, PA-C  cetirizine (ZYRTEC) 10 MG tablet Take 1 tablet (10 mg total) by mouth daily. 05/20/14  Yes Tishira R Brewington, PA-C  fluticasone (FLONASE) 50 MCG/ACT nasal spray Place 2 sprays into both nostrils daily. 04/19/15  Yes Baron Parmelee, PA-C  ibuprofen (ADVIL,MOTRIN) 600 MG tablet Take 1 tablet (600 mg total) by mouth every 6 (six) hours as needed. 05/11/15  Yes Earley Favor, NP  methocarbamol (ROBAXIN) 500 MG tablet Take 1 tablet (500 mg total) by mouth 2 (two) times daily. 05/11/15  Yes Earley Favor, NP  montelukast (SINGULAIR) 10 MG tablet Take 1 tablet (10 mg total) by mouth at bedtime. 04/19/15  Yes Unique Sillas, PA-C  sertraline (ZOLOFT) 50 MG tablet Take 1 tablet (50 mg total) by mouth daily. 05/14/15  Yes  Keng Jewel, PA-C     No Known Allergies     Objective:  Physical Exam  Constitutional: She is oriented to person, place, and time. She appears well-developed and well-nourished. No distress (crying).  BP 125/80 mmHg  Pulse 54  Temp(Src) 97.9 F (36.6 C) (Oral)  Resp 16  Ht 5' 4.5" (1.638 m)  Wt 202 lb 12.8 oz (91.989 kg)  BMI 34.29 kg/m2  SpO2 100%  LMP  05/17/2015  HENT:  Head: Normocephalic and atraumatic.  Right Ear: Hearing normal.  Left Ear: Hearing normal.  Eyes: Conjunctivae and EOM are normal. Pupils are equal, round, and reactive to light.  Neck: Normal range of motion and phonation normal. Neck supple. No thyroid mass and no thyromegaly present.  Cardiovascular: Normal rate, regular rhythm and normal heart sounds.   Pulmonary/Chest: Effort normal and breath sounds normal.  Lymphadenopathy:    She has no cervical adenopathy.  Neurological: She is alert and oriented to person, place, and time.  Skin: Skin is warm, dry and intact. No ecchymosis and no laceration noted.  Psychiatric: She has a normal mood and affect. Her speech is normal and behavior is normal. Judgment and thought content normal. Cognition and memory are normal.           Assessment & Plan:   1. Adjustment reaction with anxiety and depression Continue sertraline at 50 mg daily. ADD Wellbutrin XL. Consider d/c sertraline, or continuing it, depending on response to Wellbutrin. Re-evaluate in 4 weeks, sooner if needed. - buPROPion (WELLBUTRIN XL) 150 MG 24 hr tablet; Take 1 tablet (150 mg total) by mouth daily.  Dispense: 90 tablet; Refill: 3   Fernande Brashelle S. Cassity Christian, PA-C Physician Assistant-Certified [facility]

## 2015-06-18 NOTE — Patient Instructions (Signed)
Start by taking both Zoloft and the Wellbutrin together in the morning. We can stop the Zoloft in the future if we choose.

## 2015-06-26 ENCOUNTER — Encounter (HOSPITAL_COMMUNITY): Payer: Self-pay | Admitting: Emergency Medicine

## 2015-06-26 ENCOUNTER — Emergency Department (HOSPITAL_COMMUNITY)
Admission: EM | Admit: 2015-06-26 | Discharge: 2015-06-26 | Disposition: A | Discharge status: Home / Self Care | Payer: 59 | Attending: Emergency Medicine | Admitting: Emergency Medicine

## 2015-06-26 DIAGNOSIS — J45909 Unspecified asthma, uncomplicated: Secondary | ICD-10-CM | POA: Diagnosis not present

## 2015-06-26 DIAGNOSIS — F439 Reaction to severe stress, unspecified: Secondary | ICD-10-CM | POA: Insufficient documentation

## 2015-06-26 DIAGNOSIS — Z79899 Other long term (current) drug therapy: Secondary | ICD-10-CM | POA: Insufficient documentation

## 2015-06-26 DIAGNOSIS — R197 Diarrhea, unspecified: Secondary | ICD-10-CM | POA: Insufficient documentation

## 2015-06-26 DIAGNOSIS — F17210 Nicotine dependence, cigarettes, uncomplicated: Secondary | ICD-10-CM | POA: Insufficient documentation

## 2015-06-26 DIAGNOSIS — R1110 Vomiting, unspecified: Secondary | ICD-10-CM | POA: Diagnosis not present

## 2015-06-26 DIAGNOSIS — R1084 Generalized abdominal pain: Secondary | ICD-10-CM | POA: Diagnosis not present

## 2015-06-26 DIAGNOSIS — F329 Major depressive disorder, single episode, unspecified: Secondary | ICD-10-CM | POA: Diagnosis not present

## 2015-06-26 DIAGNOSIS — F419 Anxiety disorder, unspecified: Secondary | ICD-10-CM | POA: Diagnosis not present

## 2015-06-26 DIAGNOSIS — F32A Depression, unspecified: Secondary | ICD-10-CM

## 2015-06-26 LAB — COMPREHENSIVE METABOLIC PANEL
ALK PHOS: 83 U/L (ref 38–126)
ALT: 18 U/L (ref 14–54)
ANION GAP: 8 (ref 5–15)
AST: 15 U/L (ref 15–41)
Albumin: 4.2 g/dL (ref 3.5–5.0)
BILIRUBIN TOTAL: 0.6 mg/dL (ref 0.3–1.2)
BUN: 14 mg/dL (ref 6–20)
CALCIUM: 9.5 mg/dL (ref 8.9–10.3)
CO2: 26 mmol/L (ref 22–32)
Chloride: 108 mmol/L (ref 101–111)
Creatinine, Ser: 0.77 mg/dL (ref 0.44–1.00)
GFR calc Af Amer: >60 mL/min (ref >60)
Glucose, Bld: 97 mg/dL (ref 65–99)
POTASSIUM: 4.5 mmol/L (ref 3.5–5.1)
Sodium: 142 mmol/L (ref 135–145)
TOTAL PROTEIN: 8 g/dL (ref 6.5–8.1)

## 2015-06-26 LAB — CBC
HCT: 44.8 % (ref 36.0–46.0)
HEMOGLOBIN: 15.2 g/dL — AB (ref 12.0–15.0)
MCH: 29 pg (ref 26.0–34.0)
MCHC: 33.9 g/dL (ref 30.0–36.0)
MCV: 85.5 fL (ref 78.0–100.0)
Platelets: 327 10*3/uL (ref 150–400)
RBC: 5.24 MIL/uL — ABNORMAL HIGH (ref 3.87–5.11)
RDW: 14 % (ref 11.5–15.5)
WBC: 5.5 10*3/uL (ref 4.0–10.5)

## 2015-06-26 LAB — ETHANOL: Alcohol, Ethyl (B): <5 mg/dL (ref <5)

## 2015-06-26 LAB — SALICYLATE LEVEL

## 2015-06-26 LAB — I-STAT BETA HCG BLOOD, ED (MC, WL, AP ONLY)

## 2015-06-26 LAB — ACETAMINOPHEN LEVEL

## 2015-06-26 LAB — LIPASE, BLOOD: Lipase: 26 U/L (ref 11–51)

## 2015-06-26 MED ORDER — MONTELUKAST SODIUM 10 MG PO TABS
10.0000 mg | ORAL_TABLET | Freq: Every day | ORAL | Status: DC
  Filled 2015-06-26: qty 1

## 2015-06-26 MED ORDER — BUDESONIDE-FORMOTEROL FUMARATE 160-4.5 MCG/ACT IN AERO
2.0000 | INHALATION_SPRAY | Freq: Two times a day (BID) | RESPIRATORY_TRACT | Status: DC
  Filled 2015-06-26: qty 6

## 2015-06-26 MED ORDER — ALBUTEROL SULFATE HFA 108 (90 BASE) MCG/ACT IN AERS: 2.0000 | INHALATION_SPRAY | RESPIRATORY_TRACT | Status: DC | PRN

## 2015-06-26 MED ORDER — ONDANSETRON HCL 4 MG PO TABS: 4.0000 mg | ORAL_TABLET | Freq: Three times a day (TID) | ORAL | Status: DC | PRN

## 2015-06-26 MED ORDER — ONDANSETRON 4 MG PO TBDP
4.0000 mg | ORAL_TABLET | Freq: Once | ORAL | Status: AC
  Administered 2015-06-26: 4 mg via ORAL
  Filled 2015-06-26: qty 1

## 2015-06-26 MED ORDER — ACETAMINOPHEN 325 MG PO TABS: 650.0000 mg | ORAL_TABLET | ORAL | Status: DC | PRN

## 2015-06-26 NOTE — ED Provider Notes (Addendum)
CSN: [id number]     Arrival date & time 06/26/15  1330 History   First MD Initiated Contact with Patient 06/26/15 1643     Chief Complaint  Patient presents with  . Emesis  . Abdominal Pain  . Diarrhea  . Suicidal   HPI Patient presents to the emergency room with complaints of worsening depression and anxiety. Patient has a history of depression. She's had trouble over the last several months. She was given a prescription for Zoloft by her doctor a few months ago. She has not felt like that medication is helped that much. Her symptoms have been getting worse especially with the stress of the exams at school. The patient has been working hard and once to make sure she does not fell and this is causing her a lot of distress. She feels like her mind is not functioning properly. Very distressing. She feels depressed.  She denies actually having any specific thoughts of harming herself. She just is not sure that she can handle the situation for much longer. Her doctor last week and started her on Wellbutrin. She felt like the Zoloft was making her a zombie so she stopped taking it several days ago. 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 She has had some episodes of vomiting and diarrhea today. She's had 2 loose stools and a couple episodes of vomiting. Patient came into the emergency room for further evaluation after the episode at class. Past Medical History  Diagnosis Date  . Allergy   . Asthma   . Morbid obesity with BMI of 40.0-44.9, adult [facility])    History reviewed. No pertinent past surgical history. Family History  Problem Relation Age of Onset  . Hyperlipidemia Father   . Heart disease Father   . Diabetes Maternal Grandmother     both sides of the family  . Diabetes Father   . Irritable bowel syndrome Father   . Irritable bowel syndrome Sister    Social History  Substance Use Topics  . Smoking status: Light Tobacco Smoker    Types: Cigarettes  . Smokeless tobacco:  Never Used  . Alcohol Use: No   OB History    No data available     Review of Systems  All other systems reviewed and are negative.     Allergies  Review of patient's allergies indicates no known allergies.  Home Medications   Prior to Admission medications   Medication Sig Start Date End Date Taking? Authorizing Provider  albuterol (PROVENTIL HFA;VENTOLIN HFA) 108 (90 BASE) MCG/ACT inhaler Inhale 2 puffs into the lungs every 4 (four) hours as needed for wheezing or shortness of breath. 04/19/15  Yes Chelle Jeffery, PA-C  albuterol (PROVENTIL) (2.5 MG/3ML) 0.083% nebulizer solution Take 3 mLs (2.5 mg total) by nebulization every 6 (six) hours as needed for wheezing or shortness of breath. 04/19/15  Yes Chelle Jeffery, PA-C  budesonide-formoterol (SYMBICORT) 160-4.5 MCG/ACT inhaler Inhale 2 puffs into the lungs 2 (two) times daily. 04/19/15  Yes Chelle Jeffery, PA-C  buPROPion (WELLBUTRIN XL) 150 MG 24 hr tablet Take 1 tablet (150 mg total) by mouth daily. 06/18/15  Yes Chelle Jeffery, PA-C  cetirizine (ZYRTEC) 10 MG tablet Take 1 tablet (10 mg total) by mouth daily. Patient taking differently: Take 10 mg by mouth daily as needed for allergies.  05/20/14  Yes Tishira R Brewington, PA-C  fluticasone (FLONASE) 50 MCG/ACT nasal spray Place 2 sprays into both nostrils daily. Patient taking differently: Place 2 sprays into both  nostrils daily as needed for allergies.  04/19/15  Yes Chelle Jeffery, PA-C  ibuprofen (ADVIL,MOTRIN) 600 MG tablet Take 1 tablet (600 mg total) by mouth every 6 (six) hours as needed. Patient taking differently: Take 600 mg by mouth every 6 (six) hours as needed for headache, mild pain or moderate pain.  05/11/15  Yes Earley FavorGail Schulz, NP  methocarbamol (ROBAXIN) 500 MG tablet Take 1 tablet (500 mg total) by mouth 2 (two) times daily. Patient taking differently: Take 500 mg by mouth 2 (two) times daily as needed for muscle spasms.  05/11/15  Yes Earley FavorGail Schulz, NP  montelukast  (SINGULAIR) 10 MG tablet Take 1 tablet (10 mg total) by mouth at bedtime. Patient taking differently: Take 10 mg by mouth at bedtime as needed (allergies).  04/19/15  Yes Chelle Jeffery, PA-C  sertraline (ZOLOFT) 50 MG tablet Take 1 tablet (50 mg total) by mouth daily. 05/14/15  Yes Chelle Jeffery, PA-C   BP 116/85 mmHg  Pulse 63  Temp(Src) 97.9 F (36.6 C) (Oral)  Resp 19  SpO2 99%  LMP 06/19/2015 Physical Exam  Constitutional: She appears distressed.  HENT:  Head: Normocephalic and atraumatic.  Right Ear: External ear normal.  Left Ear: External ear normal.  Eyes: Conjunctivae are normal. Right eye exhibits no discharge. Left eye exhibits no discharge. No scleral icterus.  Neck: Neck supple. No tracheal deviation present.  Cardiovascular: Normal rate, regular rhythm and intact distal pulses.   Pulmonary/Chest: Effort normal and breath sounds normal. No stridor. No respiratory distress. She has no wheezes. She has no rales.  Abdominal: Soft. Bowel sounds are normal. She exhibits no distension. There is no tenderness. There is no rebound and no guarding.  Musculoskeletal: She exhibits no edema or tenderness.  Neurological: She is alert. She has normal strength. No cranial nerve deficit (no facial droop, extraocular movements intact, no slurred speech) or sensory deficit. She exhibits normal muscle tone. She displays no seizure activity. Coordination normal.  Skin: Skin is warm and dry. No rash noted. She is not diaphoretic.  Psychiatric: Her mood appears anxious. She is withdrawn. Cognition and memory are not impaired. She does not express impulsivity. She exhibits a depressed mood. She expresses no homicidal and no suicidal ideation.  Nursing note and vitals reviewed.   ED Course  Procedures (including critical care time) Labs Review Labs Reviewed  CBC - Abnormal; Notable for the following:    RBC 5.24 (*)    Hemoglobin 15.2 (*)    All other components within normal limits   ACETAMINOPHEN LEVEL - Abnormal; Notable for the following:    Acetaminophen (Tylenol), Serum <10 (*)    All other components within normal limits  LIPASE, BLOOD  COMPREHENSIVE METABOLIC PANEL  ETHANOL  SALICYLATE LEVEL  URINALYSIS, ROUTINE W REFLEX MICROSCOPIC (NOT AT [facility])  I-STAT BETA HCG BLOOD, ED ([facility], [facility], [facility] ONLY)      MDM   Final diagnoses:  Depression  Vomiting and diarrhea    The patient complains of generalized abdominal discomfort associated with vomiting and diarrhea. Take her symptoms may be related to the stress and anxiety she is experiencing possibly a viral illness. I doubt appendicitis or other significant or surgical process.  She denies suicidal or homicidal ideation. She clearly is having some issues with depression. Patient states she wants to go home and she needs to study. I encouraged her to stay in the emergency room so that she can be assessed since she is having difficulty with her stress and depression.  He is to wait at this time.    Linwood Dibbles, MD 06/26/15 1710  Patient waited in the psychiatric ED to be assessed for an hour and a half.  The patient was still reading to speak with TTS.  Patient states she is feeling better and really does need to go back in study. She is concerned about her exam tomorrow. Patient denies any suicidal ideation. I do not think she meets criteria for involuntary commitment. I encouraged patient to follow up with her therapist. Return to the emergency room immediately for worsening symptoms or thoughts of suicide  Linwood Dibbles, MD 06/26/15 1842

## 2015-06-26 NOTE — ED Notes (Signed)
Patient was sent back to talk to social work about some resources but wanting to leave. Doesn't fit criteria for involuntary commitment so physician felt comfortable discharging patient. Adamantly denies any SI. Says going to study for exam. Contracts for safety. Left AMA.

## 2015-06-26 NOTE — Discharge Instructions (Signed)
Major Depressive Disorder Major depressive disorder is a mental illness. It also may be called clinical depression or unipolar depression. Major depressive disorder usually causes feelings of sadness, hopelessness, or helplessness. Some people with this disorder do not feel particularly sad but lose interest in doing things they used to enjoy (anhedonia). Major depressive disorder also can cause physical symptoms. It can interfere with work, school, relationships, and other normal everyday activities. The disorder varies in severity but is longer lasting and more serious than the sadness we all feel from time to time in our lives. Major depressive disorder often is triggered by stressful life events or major life changes. Examples of these triggers include divorce, loss of your job or home, a move, and the death of a family member or close friend. Sometimes this disorder occurs for no obvious reason at all. People who have family members with major depressive disorder or bipolar disorder are at higher risk for developing this disorder, with or without life stressors. Major depressive disorder can occur at any age. It may occur just once in your life (single episode major depressive disorder). It may occur multiple times (recurrent major depressive disorder). SYMPTOMS People with major depressive disorder have either anhedonia or depressed mood on nearly a daily basis for at least 2 weeks or longer. Symptoms of depressed mood include:  Feelings of sadness (blue or down in the dumps) or emptiness.  Feelings of hopelessness or helplessness.  Tearfulness or episodes of crying (may be observed by others).  Irritability (children and adolescents). In addition to depressed mood or anhedonia or both, people with this disorder have at least four of the following symptoms:  Difficulty sleeping or sleeping too much.   Significant change (increase or decrease) in appetite or weight.   Lack of energy or  motivation.  Feelings of guilt and worthlessness.   Difficulty concentrating, remembering, or making decisions.  Unusually slow movement (psychomotor retardation) or restlessness (as observed by others).   Recurrent wishes for death, recurrent thoughts of self-harm (suicide), or a suicide attempt. People with major depressive disorder commonly have persistent negative thoughts about themselves, other people, and the world. People with severe major depressive disorder may experiencedistorted beliefs or perceptions about the world (psychotic delusions). They also may see or hear things that are not real (psychotic hallucinations). DIAGNOSIS Major depressive disorder is diagnosed through an assessment by your health care provider. Your health care provider will ask aboutaspects of your daily life, such as mood,sleep, and appetite, to see if you have the diagnostic symptoms of major depressive disorder. Your health care provider may ask about your medical history and use of alcohol or drugs, including prescription medicines. Your health care provider also may do a physical exam and blood work. This is because certain medical conditions and the use of certain substances can cause major depressive disorder-like symptoms (secondary depression). Your health care provider also may refer you to a mental health specialist for further evaluation and treatment. TREATMENT It is important to recognize the symptoms of major depressive disorder and seek treatment. The following treatments can be prescribed for this disorder:   Medicine. Antidepressant medicines usually are prescribed. Antidepressant medicines are thought to correct chemical imbalances in the brain that are commonly associated with major depressive disorder. Other types of medicine may be added if the symptoms do not respond to antidepressant medicines alone or if psychotic delusions or hallucinations occur.  Talk therapy. Talk therapy can be  helpful in treating major depressive disorder by providing   support, education, and guidance. Certain types of talk therapy also can help with negative thinking (cognitive behavioral therapy) and with relationship issues that trigger this disorder (interpersonal therapy). A mental health specialist can help determine which treatment is best for you. Most people with major depressive disorder do well with a combination of medicine and talk therapy. Treatments involving electrical stimulation of the brain can be used in situations with extremely severe symptoms or when medicine and talk therapy do not work over time. These treatments include electroconvulsive therapy, transcranial magnetic stimulation, and vagal nerve stimulation.   This information is not intended to replace advice given to you by your health care provider. Make sure you discuss any questions you have with your health care provider.   Document Released: 10/31/2012 Document Revised: 07/27/2014 Document Reviewed: 10/31/2012 Elsevier Interactive Patient Education 2016 [employer]  

## 2015-06-26 NOTE — ED Notes (Addendum)
[facility] EMS [facility] generalized abd pain, vomiting and diarrhea x 1day.  Pt reported to EMS that she stopped taking Zoloft 5 days ago due to it making her drowsy and unable to study effectively but has continued taking her Wellbutrin.  Pt is very nervous and tearful with EMS.  Pt reported that finals are coming up and causing her to feel that way. Pt states that her thoughts of wanting to hurt herself have gotten worse since stopping Zoloft but denies a plan of how she would hurt herself.  Pt states that her doctor is aware of increased SI.

## 2015-06-26 NOTE — Progress Notes (Signed)
EDCM spoke to patient at bedside.  Patient confirms she has [employer] insurance.  She confirms her pcp is Porfirio [facility]helle [facility] [address].  Patient has an upcoming appointment with her pcp on Jul 23 2015 at 1300pm.  No further EDCM needs at this time.

## 2015-06-26 NOTE — ED Notes (Signed)
Bed: [facility]39 Expected date:  Expected time:  Means of arrival:  Comments: Triage 4 

## 2015-06-26 NOTE — ED Notes (Signed)
Patient pleasant on approach today. Reports that the zoloft has made her depression worse and she has recently stopped even when her psychiatrist told her to take with wellbutrin. She says she just wanted to talk to someone about her medication and her feelings and maybe get some information on some things to help her beside medication. She feels that her psychiatrist just wants to give her medications when she makes a complaint about something. Feels overwhelmed this week with finals but is afraid that being in the hospital until tomorrow will make her fail her exam because her professor is really hard about that. Adamantly denies any SI at present and doesn't have a plan. Contracts for safety.

## 2015-07-23 ENCOUNTER — Encounter: Payer: Self-pay | Admitting: Physician Assistant

## 2015-07-23 ENCOUNTER — Ambulatory Visit (INDEPENDENT_AMBULATORY_CARE_PROVIDER_SITE_OTHER): Payer: 59 | Admitting: Physician Assistant

## 2015-07-23 VITALS — BP 114/74 | HR 59 | Temp 98.5°F | Resp 16 | Ht 64.5 in | Wt 209.0 lb

## 2015-07-23 DIAGNOSIS — F4323 Adjustment disorder with mixed anxiety and depressed mood: Secondary | ICD-10-CM

## 2015-07-23 DIAGNOSIS — Z3200 Encounter for pregnancy test, result unknown: Secondary | ICD-10-CM

## 2015-07-23 DIAGNOSIS — J45909 Unspecified asthma, uncomplicated: Secondary | ICD-10-CM

## 2015-07-23 DIAGNOSIS — Z3202 Encounter for pregnancy test, result negative: Secondary | ICD-10-CM

## 2015-07-23 LAB — POCT URINE PREGNANCY: PREG TEST UR: NEGATIVE

## 2015-07-23 NOTE — Progress Notes (Signed)
Patient ID: Shannon Lopez, female    DOB: 02/02/1991, 25 y.o.   MRN: [id number]  PCP: No PCP Per Patient  Subjective:   Chief Complaint  Patient presents with  . Follow-up  . Depression    "stop taking meds, feels better."  . lower abd cramps x 2 1/2 wks    HPI Presents for evaluation of depression.  Diagnosis made 2 months ago and sertraline was started. She didn't notice much improvement and had considerable fatigue and lack of motivation. At her follow-up 1 month ago, Wellbutrin was added, with the plan to consider D/C sertraline at today's visit.  Since her last visit, she has stopped both medications. She feels great. Sleeping well. Exercising. Has gained a few pounds, but her clothes are fitting more loosely.  Rarely needs oral antihistamine. Hasn't needed her albuterol rescue inhaler.  Desires a pregnancy test. Having cramping, even though she's not menstruating, x 2.5 weeks. Had brighter red blood than usual at her last period, 07/18/15. Stools are more irregular now (was having BID stools).  Review of Systems As above  [facility] 07/23/2015 06/18/2015 05/14/2015  Decreased Interest 0 3 3  Down, Depressed, Hopeless 0 3 2  PHQ - 2 Score 0 6 5  Altered sleeping 0 3 3  Tired, decreased energy 0 3 3  Change in appetite 0 3 3  Feeling bad or failure about yourself  0 3 3  Trouble concentrating 0 3 3  Moving slowly or fidgety/restless 0 2 2  Suicidal thoughts 0 3 2  PHQ-9 Score 0 26 24  Difficult doing work/chores - - Extremely dIfficult      Patient Active Problem List   Diagnosis Date Noted  . Depression 06/18/2015  . Asthma 05/20/2014     Prior to Admission medications   Medication Sig Start Date End Date Taking? Authorizing Provider  albuterol (PROVENTIL HFA;VENTOLIN HFA) 108 (90 BASE) MCG/ACT inhaler Inhale 2 puffs into the lungs every 4 (four) hours as needed for wheezing or shortness of breath. 04/19/15   Ruhama Lehew, PA-C  albuterol  (PROVENTIL) (2.5 MG/3ML) 0.083% nebulizer solution Take 3 mLs (2.5 mg total) by nebulization every 6 (six) hours as needed for wheezing or shortness of breath. 04/19/15   Destiny Trickey, PA-C  budesonide-formoterol (SYMBICORT) 160-4.5 MCG/ACT inhaler Inhale 2 puffs into the lungs 2 (two) times daily. 04/19/15   Josip Merolla, PA-C  cetirizine (ZYRTEC) 10 MG tablet Take 1 tablet (10 mg total) by mouth daily. Patient taking differently: Take 10 mg by mouth daily as needed for allergies.  05/20/14   Tishira R Brewington, PA-C  fluticasone (FLONASE) 50 MCG/ACT nasal spray Place 2 sprays into both nostrils daily. Patient taking differently: Place 2 sprays into both nostrils daily as needed for allergies.  04/19/15   Jary Louvier, PA-C  ibuprofen (ADVIL,MOTRIN) 600 MG tablet Take 1 tablet (600 mg total) by mouth every 6 (six) hours as needed. Patient taking differently: Take 600 mg by mouth every 6 (six) hours as needed for headache, mild pain or moderate pain.  05/11/15   Earley FavorGail Schulz, NP  methocarbamol (ROBAXIN) 500 MG tablet Take 1 tablet (500 mg total) by mouth 2 (two) times daily. Patient taking differently: Take 500 mg by mouth 2 (two) times daily as needed for muscle spasms.  05/11/15   Earley FavorGail Schulz, NP  montelukast (SINGULAIR) 10 MG tablet Take 1 tablet (10 mg total) by mouth at bedtime. Patient taking differently: Take 10 mg by mouth at bedtime  as needed (allergies).  04/19/15   Arius Harnois, PA-C     No Known Allergies     Objective:  Physical Exam  Constitutional: She is oriented to person, place, and time. Vital signs are normal. She appears well-developed and well-nourished. She is active and cooperative. No distress.  BP 114/74 mmHg  Pulse 59  Temp(Src) 98.5 F (36.9 C) (Oral)  Resp 16  Ht 5' 4.5" (1.638 m)  Wt 209 lb (94.802 kg)  BMI 35.33 kg/m2  SpO2 98%  LMP 07/18/2015  HENT:  Head: Normocephalic and atraumatic.  Right Ear: Hearing normal.  Left Ear: Hearing normal.  Eyes:  Conjunctivae are normal. No scleral icterus.  Neck: Normal range of motion. Neck supple. No thyromegaly present.  Cardiovascular: Normal rate, regular rhythm and normal heart sounds.   Pulses:      Radial pulses are 2+ on the right side, and 2+ on the left side.  Pulmonary/Chest: Effort normal and breath sounds normal.  Lymphadenopathy:       Head (right side): No tonsillar, no preauricular, no posterior auricular and no occipital adenopathy present.       Head (left side): No tonsillar, no preauricular, no posterior auricular and no occipital adenopathy present.    She has no cervical adenopathy.       Right: No supraclavicular adenopathy present.       Left: No supraclavicular adenopathy present.  Neurological: She is alert and oriented to person, place, and time. No sensory deficit.  Skin: Skin is warm, dry and intact. No rash noted. No cyanosis or erythema. Nails show no clubbing.  Psychiatric: She has a normal mood and affect. Her speech is normal and behavior is normal.   Results for orders placed or performed in visit on 07/23/15  POCT urine pregnancy  Result Value Ref Range   Preg Test, Ur Negative Negative           Assessment & Plan:  1. Adjustment reaction with anxiety and depression Symptoms are resolved. She is off meds.   2. Asthma, unspecified asthma severity, uncomplicated Well controlled.  3. Encounter for pregnancy test Not pregnant. - POCT urine pregnancy   Return in about 6 months (around 01/20/2016), or if symptoms worsen or fail to improve.   Fernande Bras, PA-C Physician Assistant-Certified [facility]

## 2016-05-16 ENCOUNTER — Emergency Department (HOSPITAL_COMMUNITY): Payer: Self-pay

## 2016-05-16 ENCOUNTER — Encounter (HOSPITAL_COMMUNITY): Payer: Self-pay | Admitting: Nurse Practitioner

## 2016-05-16 ENCOUNTER — Emergency Department (HOSPITAL_COMMUNITY)
Admission: EM | Admit: 2016-05-16 | Discharge: 2016-05-16 | Disposition: A | Discharge status: Home / Self Care | Payer: Self-pay | Attending: Emergency Medicine | Admitting: Emergency Medicine

## 2016-05-16 DIAGNOSIS — J069 Acute upper respiratory infection, unspecified: Secondary | ICD-10-CM | POA: Insufficient documentation

## 2016-05-16 DIAGNOSIS — J45909 Unspecified asthma, uncomplicated: Secondary | ICD-10-CM | POA: Insufficient documentation

## 2016-05-16 DIAGNOSIS — Z79899 Other long term (current) drug therapy: Secondary | ICD-10-CM | POA: Insufficient documentation

## 2016-05-16 DIAGNOSIS — R197 Diarrhea, unspecified: Secondary | ICD-10-CM | POA: Insufficient documentation

## 2016-05-16 DIAGNOSIS — Z791 Long term (current) use of non-steroidal anti-inflammatories (NSAID): Secondary | ICD-10-CM | POA: Insufficient documentation

## 2016-05-16 DIAGNOSIS — F17210 Nicotine dependence, cigarettes, uncomplicated: Secondary | ICD-10-CM | POA: Insufficient documentation

## 2016-05-16 DIAGNOSIS — R112 Nausea with vomiting, unspecified: Secondary | ICD-10-CM | POA: Insufficient documentation

## 2016-05-16 LAB — URINALYSIS, ROUTINE W REFLEX MICROSCOPIC
Bilirubin Urine: NEGATIVE
Glucose, UA: NEGATIVE mg/dL
HGB URINE DIPSTICK: NEGATIVE
Ketones, ur: NEGATIVE mg/dL
LEUKOCYTES UA: NEGATIVE
NITRITE: NEGATIVE
PROTEIN: NEGATIVE mg/dL
SPECIFIC GRAVITY, URINE: 1.02 (ref 1.005–1.030)
pH: 6.5 (ref 5.0–8.0)

## 2016-05-16 LAB — POC URINE PREG, ED: PREG TEST UR: NEGATIVE

## 2016-05-16 MED ORDER — HYDROCOD POLST-CPM POLST ER 10-8 MG/5ML PO SUER
5.0000 mL | Freq: Once | ORAL | Status: AC
  Administered 2016-05-16: 5 mL via ORAL
  Filled 2016-05-16: qty 5

## 2016-05-16 MED ORDER — ALBUTEROL SULFATE (2.5 MG/3ML) 0.083% IN NEBU
5.0000 mg | INHALATION_SOLUTION | Freq: Once | RESPIRATORY_TRACT | Status: AC
  Administered 2016-05-16: 5 mg via RESPIRATORY_TRACT
  Filled 2016-05-16: qty 6

## 2016-05-16 MED ORDER — HYDROCOD POLST-CPM POLST ER 10-8 MG/5ML PO SUER: 5.0000 mL | Freq: Two times a day (BID) | ORAL | 0 refills | Status: DC | PRN

## 2016-05-16 MED ORDER — ALBUTEROL SULFATE HFA 108 (90 BASE) MCG/ACT IN AERS
2.0000 | INHALATION_SPRAY | RESPIRATORY_TRACT | Status: DC | PRN
  Administered 2016-05-16: 2 via RESPIRATORY_TRACT
  Filled 2016-05-16: qty 6.7

## 2016-05-16 MED ORDER — ONDANSETRON 4 MG PO TBDP
4.0000 mg | ORAL_TABLET | Freq: Once | ORAL | Status: AC | PRN
  Administered 2016-05-16: 4 mg via ORAL
  Filled 2016-05-16 (×2): qty 1

## 2016-05-16 MED ORDER — IPRATROPIUM BROMIDE 0.02 % IN SOLN
0.5000 mg | Freq: Once | RESPIRATORY_TRACT | Status: AC
  Administered 2016-05-16: 0.5 mg via RESPIRATORY_TRACT
  Filled 2016-05-16: qty 2.5

## 2016-05-16 NOTE — ED Notes (Addendum)
Pt refused blood draw

## 2016-05-16 NOTE — ED Provider Notes (Signed)
[facility]-EMERGENCY DEPT Provider Note   CSN: [id number] Arrival date & time: 05/16/16  1835  By signing my name below, I, Sonum Patel, attest that this documentation has been prepared under the direction and in the presence of Evonna Stoltz A Alanea Woolridge PA-C,. Electronically Signed: Sonum Patel, Neurosurgeon. 05/16/16. 8:39 PM.  History   Chief Complaint Chief Complaint  Patient presents with  . Cough  . Emesis  . Diarrhea  . Wheezing    The history is provided by the patient. No language interpreter was used.     HPI Comments: Shannon Lopez is a 25 y.o. female with past medical history of asthma who presents to the Emergency Department complaining of 3 days of intermittent cough with associated congestion, rhinorrhea, sore throat. She states she was feeling better yesterday but then her symptoms worsened today. She takes Singulair and Symbicort at home. She has a nebulizer which she used 3 times today without significant relief. She has run out of her albuterol inhaler. She also complains of persistent diarrhea along with nausea and vomiting. She states sometimes the vomiting is because she is nauseated and sometimes the vomiting is post-tussive.  She denies fever.   Past Medical History:  Diagnosis Date  . Allergy   . Asthma   . Depression 06/18/2015  . Morbid obesity with BMI of 40.0-44.9, adult [facility])     Patient Active Problem List   Diagnosis Date Noted  . Asthma 05/20/2014    History reviewed. No pertinent surgical history.  OB History    No data available       Home Medications    Prior to Admission medications   Medication Sig Start Date End Date Taking? Authorizing Provider  albuterol (PROVENTIL HFA;VENTOLIN HFA) 108 (90 BASE) MCG/ACT inhaler Inhale 2 puffs into the lungs every 4 (four) hours as needed for wheezing or shortness of breath. 04/19/15   Chelle Jeffery, PA-C  albuterol (PROVENTIL) (2.5 MG/3ML) 0.083% nebulizer solution Take 3 mLs (2.5 mg total) by nebulization every 6  (six) hours as needed for wheezing or shortness of breath. 04/19/15   Chelle Jeffery, PA-C  budesonide-formoterol (SYMBICORT) 160-4.5 MCG/ACT inhaler Inhale 2 puffs into the lungs 2 (two) times daily. 04/19/15   Chelle Jeffery, PA-C  cetirizine (ZYRTEC) 10 MG tablet Take 1 tablet (10 mg total) by mouth daily. Patient taking differently: Take 10 mg by mouth daily as needed for allergies.  05/20/14   Tishira R Brewington, PA-C  fluticasone (FLONASE) 50 MCG/ACT nasal spray Place 2 sprays into both nostrils daily. Patient not taking: Reported on 07/23/2015 04/19/15   Chelle Jeffery, PA-C  ibuprofen (ADVIL,MOTRIN) 600 MG tablet Take 1 tablet (600 mg total) by mouth every 6 (six) hours as needed. Patient taking differently: Take 600 mg by mouth every 6 (six) hours as needed for headache, mild pain or moderate pain.  05/11/15   Earley Favor, NP  methocarbamol (ROBAXIN) 500 MG tablet Take 1 tablet (500 mg total) by mouth 2 (two) times daily. Patient not taking: Reported on 07/23/2015 05/11/15   Earley Favor, NP  montelukast (SINGULAIR) 10 MG tablet Take 1 tablet (10 mg total) by mouth at bedtime. Patient taking differently: Take 10 mg by mouth at bedtime as needed (allergies).  04/19/15   Porfirio Oar, PA-C    Family History Family History  Problem Relation Age of Onset  . Hyperlipidemia Father   . Heart disease Father   . Diabetes Father   . Irritable bowel syndrome Father   . Diabetes Maternal Grandmother  both sides of the family  . Irritable bowel syndrome Sister     Social History Social History  Substance Use Topics  . Smoking status: Light Tobacco Smoker    Types: Cigarettes  . Smokeless tobacco: Never Used  . Alcohol use No     Allergies   Review of patient's allergies indicates no known allergies.   Review of Systems Review of Systems  Constitutional: Negative for fever.  HENT: Positive for congestion, rhinorrhea and sore throat.   Respiratory: Positive for cough and wheezing.    Gastrointestinal: Positive for diarrhea, nausea and vomiting.  All other systems reviewed and are negative.    Physical Exam Updated Vital Signs BP 159/84   Pulse 82   Temp 99.5 F (37.5 C) (Oral)   Resp 22   LMP  (LMP Unknown) Comment: Irregular periods due to getting off of birth control  SpO2 99%   Physical Exam  Constitutional: She is oriented to person, place, and time. She appears well-developed and well-nourished.  HENT:  Head: Normocephalic and atraumatic.  Right Ear: Tympanic membrane normal.  Left Ear: Tympanic membrane normal.  Mouth/Throat: Posterior oropharyngeal erythema present. No oropharyngeal exudate or posterior oropharyngeal edema. No tonsillar exudate.  Oropharynx is red without tonsillar swelling or exudates  Cardiovascular: Normal rate, regular rhythm and normal heart sounds.   No murmur heard. Pulmonary/Chest: Effort normal and breath sounds normal. She has no wheezes.  No wheezing. Full breath sounds. Actively coughing with respirations  Abdominal: Soft. She exhibits no distension and no mass. There is no tenderness. There is no rebound and no guarding.  Lymphadenopathy:    She has no cervical adenopathy.  Neurological: She is alert and oriented to person, place, and time.  Skin: Skin is warm and dry.  Psychiatric: She has a normal mood and affect.  Nursing note and vitals reviewed.    ED Treatments / Results  DIAGNOSTIC STUDIES: Oxygen Saturation is 99% on RA, normal by my interpretation.    COORDINATION OF CARE: 8:39 PM Discussed treatment plan with pt at bedside and pt agreed to plan.    Labs (all labs ordered are listed, but only abnormal results are displayed) Labs Reviewed  URINALYSIS, ROUTINE W REFLEX MICROSCOPIC (NOT AT [facility])  POC URINE PREG, ED   Results for orders placed or performed during the hospital encounter of 05/16/16  Urinalysis, Routine w reflex microscopic  Result Value Ref Range   Color, Urine YELLOW YELLOW    APPearance CLEAR CLEAR   Specific Gravity, Urine 1.020 1.005 - 1.030   pH 6.5 5.0 - 8.0   Glucose, UA NEGATIVE NEGATIVE mg/dL   Hgb urine dipstick NEGATIVE NEGATIVE   Bilirubin Urine NEGATIVE NEGATIVE   Ketones, ur NEGATIVE NEGATIVE mg/dL   Protein, ur NEGATIVE NEGATIVE mg/dL   Nitrite NEGATIVE NEGATIVE   Leukocytes, UA NEGATIVE NEGATIVE  POC urine preg, ED  Result Value Ref Range   Preg Test, Ur NEGATIVE NEGATIVE    EKG  EKG Interpretation None       Radiology No results found.  Procedures Procedures (including critical care time)  Medications Ordered in ED Medications  albuterol (PROVENTIL) (2.5 MG/3ML) 0.083% nebulizer solution 5 mg (5 mg Nebulization Given 05/16/16 1922)  ondansetron (ZOFRAN-ODT) disintegrating tablet 4 mg (4 mg Oral Given 05/16/16 2006)     Initial Impression / Assessment and Plan / ED Course  I have reviewed the triage vital signs and the nursing notes.  Pertinent labs & imaging results that were available during my  care of the patient were reviewed by me and considered in my medical decision making (see chart for details).  Clinical Course    Patient with c/o URI symptoms of cough, congestion, sore throat. She also complains of diarrhea. Symptoms of 4 days. Low grade fever.   CXR showing no PNA. She is well appearing. Cough is better after duoneb. She was concerned about her potassium given diarrhea but refused I-stat chem8 blood draw. She is felt stable for discharge home.   Final Clinical Impressions(s) / ED Diagnoses   Final diagnoses:  None  1. URI 2. History of asthma 3. diarrhea  New Prescriptions New Prescriptions   No medications on file   I personally performed the services described in this documentation, which was scribed in my presence. The recorded information has been reviewed and is accurate.      Elpidio AnisShari Airlie Blumenberg, PA-C 05/16/16 2312    Rolland PorterMark James, MD 05/25/16 2049

## 2016-05-16 NOTE — ED Triage Notes (Signed)
Pt c/o severe nausea, vomiting, wheezing and "a flare up of her chronic bronchitis" she states her home nebulizer treatment did not help.

## 2016-07-26 ENCOUNTER — Encounter (HOSPITAL_COMMUNITY): Payer: Self-pay | Admitting: Emergency Medicine

## 2016-07-26 ENCOUNTER — Emergency Department (HOSPITAL_COMMUNITY)
Admission: EM | Admit: 2016-07-26 | Discharge: 2016-07-26 | Disposition: A | Discharge status: Home / Self Care | Payer: Self-pay | Attending: Emergency Medicine | Admitting: Emergency Medicine

## 2016-07-26 DIAGNOSIS — X152XXA Contact with hotplate, initial encounter: Secondary | ICD-10-CM | POA: Insufficient documentation

## 2016-07-26 DIAGNOSIS — T23221A Burn of second degree of single right finger (nail) except thumb, initial encounter: Secondary | ICD-10-CM

## 2016-07-26 DIAGNOSIS — Y999 Unspecified external cause status: Secondary | ICD-10-CM | POA: Insufficient documentation

## 2016-07-26 DIAGNOSIS — Z79899 Other long term (current) drug therapy: Secondary | ICD-10-CM | POA: Insufficient documentation

## 2016-07-26 DIAGNOSIS — Y93G3 Activity, cooking and baking: Secondary | ICD-10-CM | POA: Insufficient documentation

## 2016-07-26 DIAGNOSIS — Y929 Unspecified place or not applicable: Secondary | ICD-10-CM | POA: Insufficient documentation

## 2016-07-26 DIAGNOSIS — J45909 Unspecified asthma, uncomplicated: Secondary | ICD-10-CM | POA: Insufficient documentation

## 2016-07-26 DIAGNOSIS — T23231A Burn of second degree of multiple right fingers (nail), not including thumb, initial encounter: Secondary | ICD-10-CM | POA: Insufficient documentation

## 2016-07-26 DIAGNOSIS — F17210 Nicotine dependence, cigarettes, uncomplicated: Secondary | ICD-10-CM | POA: Insufficient documentation

## 2016-07-26 MED ORDER — ONDANSETRON 4 MG PO TBDP
4.0000 mg | ORAL_TABLET | Freq: Once | ORAL | Status: AC
  Administered 2016-07-26: 4 mg via ORAL
  Filled 2016-07-26: qty 1

## 2016-07-26 MED ORDER — BACITRACIN ZINC 500 UNIT/GM EX OINT
TOPICAL_OINTMENT | Freq: Two times a day (BID) | CUTANEOUS | Status: DC
  Administered 2016-07-26: 21:00:00 via TOPICAL
  Filled 2016-07-26: qty 0.9

## 2016-07-26 MED ORDER — HYDROMORPHONE HCL 2 MG/ML IJ SOLN
2.0000 mg | Freq: Once | INTRAMUSCULAR | Status: AC
  Administered 2016-07-26: 2 mg via INTRAMUSCULAR
  Filled 2016-07-26: qty 1

## 2016-07-26 MED ORDER — OXYCODONE-ACETAMINOPHEN 5-325 MG PO TABS: 1.0000 | ORAL_TABLET | Freq: Four times a day (QID) | ORAL | 0 refills | Status: DC | PRN

## 2016-07-26 MED ORDER — OXYCODONE-ACETAMINOPHEN 5-325 MG PO TABS
1.0000 | ORAL_TABLET | Freq: Once | ORAL | Status: AC
Start: 2016-07-26 — End: 2016-07-26
  Administered 2016-07-26: 1 via ORAL
  Filled 2016-07-26: qty 1
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
## 2016-07-26 NOTE — ED Notes (Signed)
Patient d/c'd self care.  F/U and medications reviewed.  Patient verbalized understanding. 

## 2016-10-12 ENCOUNTER — Emergency Department (HOSPITAL_COMMUNITY)
Admission: EM | Admit: 2016-10-12 | Discharge: 2016-10-12 | Disposition: A | Discharge status: Home / Self Care | Payer: Medicaid Other | Attending: Emergency Medicine | Admitting: Emergency Medicine

## 2016-10-12 ENCOUNTER — Emergency Department (HOSPITAL_COMMUNITY): Payer: Medicaid Other

## 2016-10-12 ENCOUNTER — Encounter (HOSPITAL_COMMUNITY): Payer: Self-pay | Admitting: Emergency Medicine

## 2016-10-12 DIAGNOSIS — F17210 Nicotine dependence, cigarettes, uncomplicated: Secondary | ICD-10-CM | POA: Insufficient documentation

## 2016-10-12 DIAGNOSIS — Z91040 Latex allergy status: Secondary | ICD-10-CM | POA: Insufficient documentation

## 2016-10-12 DIAGNOSIS — R072 Precordial pain: Secondary | ICD-10-CM | POA: Insufficient documentation

## 2016-10-12 DIAGNOSIS — R0789 Other chest pain: Secondary | ICD-10-CM

## 2016-10-12 DIAGNOSIS — J45909 Unspecified asthma, uncomplicated: Secondary | ICD-10-CM | POA: Insufficient documentation

## 2016-10-12 DIAGNOSIS — R079 Chest pain, unspecified: Secondary | ICD-10-CM | POA: Diagnosis present

## 2016-10-12 LAB — CBC
HEMATOCRIT: 40.9 % (ref 36.0–46.0)
Hemoglobin: 13.9 g/dL (ref 12.0–15.0)
MCH: 26.7 pg (ref 26.0–34.0)
MCHC: 34 g/dL (ref 30.0–36.0)
MCV: 78.5 fL (ref 78.0–100.0)
Platelets: 349 10*3/uL (ref 150–400)
RBC: 5.21 MIL/uL — ABNORMAL HIGH (ref 3.87–5.11)
RDW: 13.8 % (ref 11.5–15.5)
WBC: 6.4 10*3/uL (ref 4.0–10.5)

## 2016-10-12 LAB — BASIC METABOLIC PANEL
Anion gap: 6 (ref 5–15)
BUN: 15 mg/dL (ref 6–20)
CO2: 25 mmol/L (ref 22–32)
Calcium: 9.3 mg/dL (ref 8.9–10.3)
Chloride: 106 mmol/L (ref 101–111)
Creatinine, Ser: 0.66 mg/dL (ref 0.44–1.00)
GFR calc Af Amer: >60 mL/min (ref >60)
GLUCOSE: 119 mg/dL — AB (ref 65–99)
POTASSIUM: 4.1 mmol/L (ref 3.5–5.1)
Sodium: 137 mmol/L (ref 135–145)

## 2016-10-12 LAB — I-STAT TROPONIN, ED: Troponin i, poc: 0 ng/mL (ref 0.00–0.08)

## 2016-10-12 LAB — I-STAT BETA HCG BLOOD, ED (MC, WL, AP ONLY): I-stat hCG, quantitative: <5 m[IU]/mL (ref <5)

## 2016-10-12 MED ORDER — NAPROXEN 500 MG PO TABS: 500.0000 mg | ORAL_TABLET | Freq: Two times a day (BID) | ORAL | 0 refills | Status: DC

## 2016-10-12 MED ORDER — ALBUTEROL SULFATE HFA 108 (90 BASE) MCG/ACT IN AERS
2.0000 | INHALATION_SPRAY | RESPIRATORY_TRACT | Status: DC | PRN
  Administered 2016-10-12: 2 via RESPIRATORY_TRACT
  Filled 2016-10-12: qty 6.7

## 2016-10-12 MED ORDER — METHOCARBAMOL 500 MG PO TABS: 500.0000 mg | ORAL_TABLET | Freq: Three times a day (TID) | ORAL | 0 refills | Status: DC | PRN

## 2016-10-12 MED ORDER — GI COCKTAIL ~~LOC~~
30.0000 mL | Freq: Once | ORAL | Status: AC
  Administered 2016-10-12: 30 mL via ORAL
  Filled 2016-10-12: qty 30

## 2016-10-12 NOTE — ED Provider Notes (Signed)
[facility]-EMERGENCY DEPT Provider Note   CSN: [id number] Arrival date & time: 10/12/16  1102     History   Chief Complaint Chief Complaint  Patient presents with  . Chest Pain    HPI Shannon Lopez is a 26 y.o. female.  HPI   26 year old morbidly obese female with history of asthma, depression, allergies complaining of chest pain.Patient report earlier this more and she was starting for tests when she experienced a sharp sensation to her mid chest that radiates towards the right side of chest. The symptoms is waxing and waning, worse with movement which concerns her. Currently rates the pain a 7 out of 10. She denies any associated fever, chills, lightheadedness, dizziness, shortness of breath, pleuritic chest pain, productive cough, abdominal pain, back pain, focal numbness or weakness or rash. Denies any strenuous activities heavy lifting or any recent injury. She does not have any significant cardiac history, denies any family history of premature cardiac death. No prior history of PE or DVT, no recent surgery, prolonged bed rest, unilateral leg swelling or calf pain, active cancer or hemoptysis. She is not on any oral birth control.  Past Medical History:  Diagnosis Date  . Allergy   . Asthma   . Depression 06/18/2015  . Morbid obesity with BMI of 40.0-44.9, adult [facility])     Patient Active Problem List   Diagnosis Date Noted  . Asthma 05/20/2014    History reviewed. No pertinent surgical history.  OB History    No data available       Home Medications    Prior to Admission medications   Medication Sig Start Date End Date Taking? Authorizing Provider  albuterol (PROVENTIL HFA;VENTOLIN HFA) 108 (90 BASE) MCG/ACT inhaler Inhale 2 puffs into the lungs every 4 (four) hours as needed for wheezing or shortness of breath. Patient not taking: Reported on 05/16/2016 04/19/15   Chelle Jeffery, PA-C  albuterol (PROVENTIL) (2.5 MG/3ML) 0.083% nebulizer solution Take 3 mLs (2.5 mg  total) by nebulization every 6 (six) hours as needed for wheezing or shortness of breath. 04/19/15   Chelle Jeffery, PA-C  budesonide-formoterol (SYMBICORT) 160-4.5 MCG/ACT inhaler Inhale 2 puffs into the lungs 2 (two) times daily. 04/19/15   Chelle Jeffery, PA-C  cetirizine (ZYRTEC) 10 MG tablet Take 1 tablet (10 mg total) by mouth daily. Patient not taking: Reported on 05/16/2016 05/20/14   Tishira R Brewington, PA-C  chlorpheniramine-HYDROcodone (TUSSIONEX PENNKINETIC ER) 10-8 MG/5ML SUER Take 5 mLs by mouth every 12 (twelve) hours as needed for cough. 05/16/16   Elpidio Anis, PA-C  fexofenadine (ALLEGRA) 180 MG tablet Take 180 mg by mouth daily.    Historical Provider, MD  fluticasone (FLONASE) 50 MCG/ACT nasal spray Place 2 sprays into both nostrils daily. Patient not taking: Reported on 07/23/2015 04/19/15   Chelle Jeffery, PA-C  ibuprofen (ADVIL,MOTRIN) 600 MG tablet Take 1 tablet (600 mg total) by mouth every 6 (six) hours as needed. Patient taking differently: Take 600 mg by mouth every 6 (six) hours as needed for headache, mild pain or moderate pain.  05/11/15   Earley Favor, NP  methocarbamol (ROBAXIN) 500 MG tablet Take 1 tablet (500 mg total) by mouth 2 (two) times daily. Patient taking differently: Take 500 mg by mouth 2 (two) times daily as needed for muscle spasms.  05/11/15   Earley Favor, NP  montelukast (SINGULAIR) 10 MG tablet Take 1 tablet (10 mg total) by mouth at bedtime. 04/19/15   Chelle Jeffery, PA-C  oxyCODONE-acetaminophen (PERCOCET/ROXICET) 5-325 MG tablet Take  1-2 tablets by mouth every 6 (six) hours as needed for severe pain. 07/26/16   Renne CriglerJoshua Geiple, PA-C    Family History Family History  Problem Relation Age of Onset  . Hyperlipidemia Father   . Heart disease Father   . Diabetes Father   . Irritable bowel syndrome Father   . Diabetes Maternal Grandmother     both sides of the family  . Irritable bowel syndrome Sister     Social History Social History  Substance  Use Topics  . Smoking status: Light Tobacco Smoker    Types: Cigarettes  . Smokeless tobacco: Never Used  . Alcohol use No     Allergies   Latex   Review of Systems Review of Systems  All other systems reviewed and are negative.    Physical Exam Updated Vital Signs BP (!) 139/102 (BP Location: Left Arm)   Pulse 94   Temp 97.9 F (36.6 C) (Oral)   Resp 17   Wt 108.9 kg   LMP 09/02/2016   SpO2 98%   BMI 40.56 kg/m   Physical Exam  Constitutional: She appears well-developed and well-nourished. No distress.  Obese female resting comfortably in no acute discomfort.  HENT:  Head: Atraumatic.  Mouth/Throat: Oropharynx is clear and moist.  Eyes: Conjunctivae are normal.  Neck: Normal range of motion. Neck supple. No JVD present.  Cardiovascular: Normal rate, regular rhythm and intact distal pulses.   Pulmonary/Chest: Effort normal and breath sounds normal. She exhibits tenderness (Mild tenderness to midsternal region on palpation without any overlying skin changes or crepitus).  Abdominal: Soft. She exhibits no distension. There is no tenderness.  Musculoskeletal: She exhibits no edema.  Neurological: She is alert.  Skin: No rash noted.  Psychiatric: She has a normal mood and affect.  Nursing note and vitals reviewed.    ED Treatments / Results  Labs (all labs ordered are listed, but only abnormal results are displayed) Labs Reviewed  BASIC METABOLIC PANEL - Abnormal; Notable for the following:       Result Value   Glucose, Bld 119 (*)    All other components within normal limits  CBC - Abnormal; Notable for the following:    RBC 5.21 (*)    All other components within normal limits  I-STAT TROPOININ, ED  I-STAT BETA HCG BLOOD, ED ([facility], [facility], [facility] ONLY)    EKG  EKG Interpretation None      Date: 10/12/2016  Rate: 90  Rhythm: normal sinus rhythm  QRS Axis: normal  Intervals: normal  ST/T Wave abnormalities: normal  Conduction Disutrbances: none   Narrative Interpretation:   Old EKG Reviewed: No significant changes noted     Radiology Dg Chest 2 View  Result Date: 10/12/2016 CLINICAL DATA:  Chest pain EXAM: CHEST  2 VIEW COMPARISON:  May 16, 2016 FINDINGS: Lungs are clear. Heart size and pulmonary vascularity are normal. No adenopathy. No pneumothorax. No bone lesions. IMPRESSION: No edema or consolidation. Electronically Signed   By: Bretta BangWilliam  Woodruff III M.D.   On: 10/12/2016 12:05    Procedures Procedures (including critical care time)  Medications Ordered in ED Medications  gi cocktail (Maalox,Lidocaine,Donnatal) (30 mLs Oral Given 10/12/16 1342)     Initial Impression / Assessment and Plan / ED Course  I have reviewed the triage vital signs and the nursing notes.  Pertinent labs & imaging results that were available during my care of the patient were reviewed by me and considered in my medical decision making (see chart  for details).     BP (!) 139/96 (BP Location: Right Arm)   Pulse 66   Temp 97.9 F (36.6 C) (Oral)   Resp 16   Ht 5\' 6"  (1.676 m)   Wt 108.9 kg   LMP 09/02/2016   SpO2 99%   BMI 38.74 kg/m    Final Clinical Impressions(s) / ED Diagnoses   Final diagnoses:  Chest wall pain    New Prescriptions New Prescriptions   NAPROXEN (NAPROSYN) 500 MG TABLET    Take 1 tablet (500 mg total) by mouth 2 (two) times daily.   12:41 PM Patient presents with reproducible midsternal chest pain. Pain is atypical for ACS. Heart score of zero.  PERC negative, doubt PE.  GI cocktail given.    2:09 PM Patient report no improvement with GI cocktail. Since patient did have reproducible chest wall pain on exam, she will be treated for potential costochondritis with anti-inflammatory medication. Encouraged patient to follow-up with a primary care provider for further evaluation of the chest pain. Return precaution discussed.   Fayrene Helper, PA-C 10/12/16 1410    Lorre Nick, MD 10/13/16 [phone]

## 2016-10-12 NOTE — ED Triage Notes (Signed)
Pt complaint of mid/right chest pain onset 0500 this morning; unrelieved by heat pack. Pt constant in nature. Denies radiation or associated symptoms; denies recent sickness and/or cough.

## 2017-01-04 ENCOUNTER — Encounter (INDEPENDENT_AMBULATORY_CARE_PROVIDER_SITE_OTHER): Payer: Self-pay | Admitting: Physician Assistant

## 2017-01-04 ENCOUNTER — Ambulatory Visit (INDEPENDENT_AMBULATORY_CARE_PROVIDER_SITE_OTHER): Payer: Medicaid - Out of State | Admitting: Physician Assistant

## 2017-01-04 VITALS — BP 132/86 | HR 73 | Temp 97.8°F | Ht 64.0 in | Wt 268.6 lb

## 2017-01-04 DIAGNOSIS — N912 Amenorrhea, unspecified: Secondary | ICD-10-CM

## 2017-01-04 DIAGNOSIS — J45909 Unspecified asthma, uncomplicated: Secondary | ICD-10-CM

## 2017-01-04 LAB — POCT URINE PREGNANCY: Preg Test, Ur: NEGATIVE

## 2017-01-04 MED ORDER — ALBUTEROL SULFATE HFA 108 (90 BASE) MCG/ACT IN AERS: 2.0000 | INHALATION_SPRAY | RESPIRATORY_TRACT | 5 refills | Status: DC | PRN

## 2017-01-04 MED ORDER — BUDESONIDE-FORMOTEROL FUMARATE 160-4.5 MCG/ACT IN AERO: 2.0000 | INHALATION_SPRAY | Freq: Two times a day (BID) | RESPIRATORY_TRACT | 3 refills | Status: DC

## 2017-01-04 NOTE — Progress Notes (Signed)
  Subjective:  Patient ID: Shannon Lopez, female    DOB: 12/03/1990  Age: 26 y.o. MRN: [id number]  CC: amenorrhea  HPI Shannon Lopez is a 26 y.o. female with a PMH of of asthma, depression, and anxiety presents with amenorrhea x4 months. Pt complaints of amenorrhea for four months. Was previously on Nexplanon until removal on October 2017. Had a normal period until February. [facility] and was told that she may be stressed from school. However, patient has not felt stressed from school at the time. Had anxiety and depression while she was going to school. Took medication for anxiety and depression but stopped taking in 2016 due to side effects. Feels emotionally stable now. 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 Reports that mother and father have thyroid disease. Last TSH normal in 2016. Does not endorse any other symptoms or complaints.       ROS Review of Systems  Constitutional: Negative for chills, fever and malaise/fatigue.  Eyes: Negative for blurred vision.  Respiratory: Negative for shortness of breath.   Cardiovascular: Negative for chest pain and palpitations.  Gastrointestinal: Negative for abdominal pain and nausea.  Genitourinary: Negative for dysuria and hematuria.  Musculoskeletal: Negative for joint pain and myalgias.  Skin: Negative for rash.  Neurological: Negative for tingling and headaches.  Endo/Heme/Allergies:       Amenorrhea  Psychiatric/Behavioral: Negative for depression. The patient is not nervous/anxious.     Objective:  BP 132/86 (BP Location: Right Arm, Patient Position: Sitting, Cuff Size: Large)   Pulse 73   Temp 97.8 F (36.6 C) (Oral)   Ht 5\' 4"  (1.626 m)   Wt 268 lb 9.6 oz (121.8 kg)   LMP 09/02/2016 (Exact Date)   SpO2 99%   BMI 46.11 kg/m   BP/Weight 01/04/2017 10/12/2016 07/26/2016  Systolic BP 132 133 146  Diastolic BP 86 93 95  Wt. (Lbs) 268.6 240 -  BMI 46.11 38.74 -       Physical Exam  Constitutional: She is oriented to person, place, and time.  Well developed, obese, NAD, polite, articulate  HENT:  Head: Normocephalic and atraumatic.  Eyes: No scleral icterus.  Neck: Normal range of motion. Neck supple. No thyromegaly present.  Cardiovascular: Normal rate, regular rhythm and normal heart sounds.   Pulmonary/Chest: Effort normal and breath sounds normal.  Abdominal: Soft. Bowel sounds are normal. She exhibits no distension and no mass. There is no tenderness.  In particular, no pelvic TTP  Musculoskeletal: She exhibits no edema.  Neurological: She is alert and oriented to person, place, and time. No cranial nerve deficit. Coordination normal.  Skin: Skin is warm and dry. No rash noted. No erythema. No pallor.  Psychiatric: She has a normal mood and affect. Her behavior is normal. Thought content normal.  Vitals reviewed.    Assessment & Plan:   1. Amenorrhea - POCT urine pregnancy negative in clinic today - AMENORRHEA PROFILE (FSH, LH, Prolactin) - TSH - US Transvaginal Non-OB; Future - US Pelvis Complete; Future  2. Asthma - Begin Proventil - Refill Symbicort 160-4.5  No orders of the defined types were placed in this encounter.   Follow-up: Return in 1 weeks.   Loletta Specteroger David Gomez PA

## 2017-01-04 NOTE — Patient Instructions (Signed)
Primary Amenorrhea Primary amenorrhea is the absence of any menstrual flow in a female by the age of 15 years. An average age for the start of menstruation is the age of 12 years. Primary amenorrhea is not considered to have occurred until a female is older than 15 years and has never menstruated. This may occur with or without other signs of puberty. What are the causes? Some common causes of not menstruating include:  Chromosomal abnormality causing the ovaries to malfunction is the most common cause of primary amenorrhea.  Malnutrition.  Low blood sugar (hypoglycemia).  Polycystic ovary syndrome (cysts in the ovaries, not ovulating).  Absence of the vagina, uterus, or ovaries since birth (congenital).  Extreme obesity.  Cystic fibrosis.  Drastic weight loss from any cause.  Over-exercising (running, biking) causing loss of body fat.  Pituitary gland tumor in the brain.  Long-term (chronic) illnesses.  Cushing disease.  Thyroid disease (hypothyroidism, hyperthyroidism).  Part of the brain (hypothalamus) not functioning normally.  Premature ovarian failure.  What are the signs or symptoms? No menstruation by age 15 years in normally developed females is the primary symptom. Other symptoms may include:  Discharge from the breasts.  Hot flashes.  Adult acne.  Facial or chest hair.  Headaches.  Impaired vision.  Recent stress.  Changes in weight, diet, or exercise patterns.  How is this diagnosed? Primary amenorrhea is diagnosed with the help of a medical history and a physical exam. Other tests that may be recommended include:  Blood tests to check for pregnancy, hormonal changes, a bleeding or thyroid disorder, low iron levels (anemia), or other problems.  Urine tests.  Specialized X-ray exams.  How is this treated? Treatment will depend on the cause. For example, some of the causes of primary amenorrhea, such as congenital absence of sex organs, will  require surgery to correct. Others may respond to treatment with medicine. Contact a health care provider if:  There has not been any menstrual flow by age 15 years.  Body maturation does not occur at a level typical of peers.  Pelvic area pain occurs.  There is unusual weight gain or hair growth. This information is not intended to replace advice given to you by your health care provider. Make sure you discuss any questions you have with your health care provider. Document Released: 07/06/2005 Document Revised: 12/12/2015 Document Reviewed: 02/15/2013 Elsevier Interactive Patient Education  2018 Elsevier Inc.  

## 2017-01-05 ENCOUNTER — Other Ambulatory Visit (INDEPENDENT_AMBULATORY_CARE_PROVIDER_SITE_OTHER): Payer: Self-pay | Admitting: Physician Assistant

## 2017-01-05 DIAGNOSIS — R7989 Other specified abnormal findings of blood chemistry: Secondary | ICD-10-CM

## 2017-01-05 LAB — AMENORRHEA PROFILE
FSH: 5.6 m[IU]/mL
LH: 12.7 m[IU]/mL
Prolactin: 12.3 ng/mL (ref 4.8–23.3)

## 2017-01-05 LAB — TSH: TSH: 10.41 u[IU]/mL — AB (ref 0.450–4.500)

## 2017-01-06 ENCOUNTER — Other Ambulatory Visit (INDEPENDENT_AMBULATORY_CARE_PROVIDER_SITE_OTHER): Payer: Medicaid Other

## 2017-01-06 DIAGNOSIS — R7989 Other specified abnormal findings of blood chemistry: Secondary | ICD-10-CM

## 2017-01-06 DIAGNOSIS — N912 Amenorrhea, unspecified: Secondary | ICD-10-CM

## 2017-01-07 LAB — THYROID PANEL WITH TSH
FREE THYROXINE INDEX: 1.7 (ref 1.2–4.9)
T3 UPTAKE RATIO: 24 % (ref 24–39)
T4 TOTAL: 7.1 ug/dL (ref 4.5–12.0)
TSH: 9.19 u[IU]/mL — AB (ref 0.450–4.500)

## 2017-01-11 ENCOUNTER — Telehealth (INDEPENDENT_AMBULATORY_CARE_PROVIDER_SITE_OTHER): Payer: Self-pay | Admitting: Physician Assistant

## 2017-01-11 ENCOUNTER — Ambulatory Visit (INDEPENDENT_AMBULATORY_CARE_PROVIDER_SITE_OTHER): Payer: Medicaid Other | Admitting: Physician Assistant

## 2017-01-11 ENCOUNTER — Ambulatory Visit (HOSPITAL_COMMUNITY)
Admission: RE | Admit: 2017-01-11 | Discharge: 2017-01-11 | Disposition: A | Discharge status: Home / Self Care | Payer: Medicaid Other | Source: Ambulatory Visit | Attending: Physician Assistant | Admitting: Physician Assistant

## 2017-01-11 DIAGNOSIS — N912 Amenorrhea, unspecified: Secondary | ICD-10-CM | POA: Insufficient documentation

## 2017-01-11 DIAGNOSIS — N858 Other specified noninflammatory disorders of uterus: Secondary | ICD-10-CM | POA: Insufficient documentation

## 2017-01-11 DIAGNOSIS — E282 Polycystic ovarian syndrome: Secondary | ICD-10-CM | POA: Insufficient documentation

## 2017-01-11 NOTE — Telephone Encounter (Signed)
Patient called to request lab results °Please follow up °

## 2017-01-12 ENCOUNTER — Other Ambulatory Visit (INDEPENDENT_AMBULATORY_CARE_PROVIDER_SITE_OTHER): Payer: Self-pay | Admitting: Physician Assistant

## 2017-01-12 DIAGNOSIS — R9389 Abnormal findings on diagnostic imaging of other specified body structures: Secondary | ICD-10-CM

## 2017-01-12 NOTE — Telephone Encounter (Signed)
LAB RESULTS SHARED WITH PATIENT. Maryjean Mornempestt S Jacarra Bobak, CMA

## 2017-01-13 ENCOUNTER — Telehealth (INDEPENDENT_AMBULATORY_CARE_PROVIDER_SITE_OTHER): Payer: Self-pay

## 2017-01-13 NOTE — Telephone Encounter (Signed)
Patient given her results and explained that GYN will contact her directly to schedule appointment. Shannon Lopez, CMA

## 2017-01-14 ENCOUNTER — Encounter (INDEPENDENT_AMBULATORY_CARE_PROVIDER_SITE_OTHER): Payer: Self-pay | Admitting: Physician Assistant

## 2017-01-14 ENCOUNTER — Ambulatory Visit (INDEPENDENT_AMBULATORY_CARE_PROVIDER_SITE_OTHER): Payer: Medicaid - Out of State | Admitting: Physician Assistant

## 2017-01-14 VITALS — BP 124/85 | HR 60 | Temp 97.9°F | Wt 268.6 lb

## 2017-01-14 DIAGNOSIS — E282 Polycystic ovarian syndrome: Secondary | ICD-10-CM

## 2017-01-14 MED ORDER — METFORMIN HCL 500 MG PO TABS: 500.0000 mg | ORAL_TABLET | Freq: Three times a day (TID) | ORAL | 3 refills | Status: DC | PRN

## 2017-01-14 NOTE — Progress Notes (Signed)
   Subjective:  Patient ID: Shannon Lopez, female    DOB: 02/21/1991  Age: 26 y.o. MRN: [id number]  CC: f/u amenorrhea  HPI Shannon Lopez is a 26 y.o. female with a PMH of asthma, depression, and obesity presents on f/u of amenorrhea. She has completed her labs and US. US shows PCOS and labs show subclinical hypothyroidism. Patient continues to have amenorrhea. Does not endorse any other symptoms.    Outpatient Medications Prior to Visit  Medication Sig Dispense Refill  . albuterol (PROVENTIL HFA;VENTOLIN HFA) 108 (90 Base) MCG/ACT inhaler Inhale 2 puffs into the lungs every 4 (four) hours as needed for wheezing or shortness of breath. 1 Inhaler 5  . budesonide-formoterol (SYMBICORT) 160-4.5 MCG/ACT inhaler Inhale 2 puffs into the lungs 2 (two) times daily. 1 Inhaler 3  . fexofenadine (ALLEGRA) 180 MG tablet Take 180 mg by mouth daily.    . montelukast (SINGULAIR) 10 MG tablet Take 1 tablet (10 mg total) by mouth at bedtime. 90 tablet 3   No facility-administered medications prior to visit.      ROS Review of Systems  Constitutional: Negative for chills, fever and malaise/fatigue.  Eyes: Negative for blurred vision.  Respiratory: Negative for shortness of breath.   Cardiovascular: Negative for chest pain and palpitations.  Gastrointestinal: Negative for abdominal pain and nausea.  Genitourinary: Negative for dysuria and hematuria.       Amenorrhea  Musculoskeletal: Negative for joint pain and myalgias.  Skin: Negative for rash.  Neurological: Negative for tingling and headaches.  Psychiatric/Behavioral: Negative for depression. The patient is not nervous/anxious.     Objective:  BP 124/85 (BP Location: Right Arm, Patient Position: Sitting, Cuff Size: Large)   Pulse 60   Temp 97.9 F (36.6 C) (Oral)   Wt 268 lb 9.6 oz (121.8 kg)   SpO2 97%   BMI 46.11 kg/m   BP/Weight 01/14/2017 01/04/2017 10/12/2016  Systolic BP 124 132 133  Diastolic BP 85 86 93  Wt. (Lbs) 268.6 268.6 240   BMI 46.11 46.11 38.74      Physical Exam  Constitutional: She is oriented to person, place, and time.  Well developed, obese, NAD, polite  HENT:  Head: Normocephalic and atraumatic.  Eyes: No scleral icterus.  Cardiovascular: Regular rhythm.   Musculoskeletal: She exhibits no edema.  Neurological: She is alert and oriented to person, place, and time.  Psychiatric: She has a normal mood and affect. Her behavior is normal. Thought content normal.  Vitals reviewed.    Assessment & Plan:   1. PCOS (polycystic ovarian syndrome) - Pt educated on PCOS and all questions answered to patient's satisfaction. - Begin metFORMIN (GLUCOPHAGE) 500 MG tablet; Take 1 tablet (500 mg total) by mouth 3 (three) times daily as needed.  Dispense: 90 tablet; Refill: 3 - Ambulatory referral to Gynecology to discuss US findings.   Meds ordered this encounter  Medications  . metFORMIN (GLUCOPHAGE) 500 MG tablet    Sig: Take 1 tablet (500 mg total) by mouth 3 (three) times daily as needed.    Dispense:  90 tablet    Refill:  3    Order Specific Question:   Supervising Provider    Answer:   Quentin AngstJEGEDE, OLUGBEMIGA E [id number]    Follow-up: Return if symptoms worsen or fail to improve.   Loletta Specteroger David Zahava Quant PA

## 2017-01-14 NOTE — Patient Instructions (Signed)
Diet for Polycystic Ovarian Syndrome Polycystic ovary syndrome (PCOS) is a disorder of the chemical messengers (hormones) that regulate menstruation. The condition causes important hormones to be out of balance. PCOS can:  Make your periods irregular or stop.  Cause cysts to develop on the ovaries.  Make it difficult to get pregnant.  Stop your body from responding to the effects of insulin (insulin resistance), which can lead to obesity and diabetes.  Changing what you eat can help manage PCOS and improve your health. It can help you lose weight and improve the way your body uses insulin. What is my plan?  Eat breakfast, lunch, and dinner plus two snacks every day.  Include protein in each meal and snack.  Choose whole grains instead of products made with refined flour.  Eat a variety of foods.  Exercise regularly as told by your health care provider. What do I need to know about this eating plan? If you are overweight or obese, pay attention to how many calories you eat. Cutting down on calories can help you lose weight. Work with your health care provider or dietitian to figure out how many calories you need each day. What foods can I eat? Grains Whole grains, such as whole wheat. Whole-grain breads, crackers, cereals, and pasta. Unsweetened oatmeal, bulgur, barley, quinoa, or brown rice. Corn or whole-wheat flour tortillas. Vegetables  Lettuce. Spinach. Peas. Beets. Cauliflower. Cabbage. Broccoli. Carrots. Tomatoes. Squash. Eggplant. Herbs. Peppers. Onions. Cucumbers. Brussels sprouts. Fruits Berries. Bananas. Apples. Oranges. Grapes. Papaya. Mango. Pomegranate. Kiwi. Grapefruit. Cherries. Meats and Other Protein Sources Lean proteins, such as fish, chicken, beans, eggs, and tofu. Dairy Low-fat dairy products, such as skim milk, cheese sticks, and yogurt. Beverages Low-fat or fat-free drinks, such as water, low-fat milk, sugar-free drinks, and 100% fruit  juice. Condiments Ketchup. Mustard. Barbecue sauce. Relish. Low-fat or fat-free mayonnaise. Fats and Oils Olive oil or canola oil. Walnuts and almonds. The items listed above may not be a complete list of recommended foods or beverages. Contact your dietitian for more options. What foods are not recommended? Foods high in calories or fat. Fried foods. Sweets. Products made from refined white flour, including white bread, pastries, white rice, and pasta. The items listed above may not be a complete list of foods and beverages to avoid. Contact your dietitian for more information. This information is not intended to replace advice given to you by your health care provider. Make sure you discuss any questions you have with your health care provider. Document Released: 10/28/2015 Document Revised: 12/12/2015 Document Reviewed: 07/18/2014 Elsevier Interactive Patient Education  2018 Elsevier Inc.  

## 2017-01-15 ENCOUNTER — Telehealth (INDEPENDENT_AMBULATORY_CARE_PROVIDER_SITE_OTHER): Payer: Self-pay | Admitting: Physician Assistant

## 2017-01-15 NOTE — Telephone Encounter (Signed)
Patient called left voicemail [facility] [address]  Please follow up with patient.

## 2017-01-15 NOTE — Telephone Encounter (Signed)
Patient informed that Rx was sent to pharmacy that she confirmed and to pick it up there and if she wishes to change pharmacies that can be done at her next OV. Maryjean Mornempestt S Roberts, CMA

## 2017-02-11 ENCOUNTER — Ambulatory Visit (INDEPENDENT_AMBULATORY_CARE_PROVIDER_SITE_OTHER): Payer: Medicaid - Out of State | Admitting: Physician Assistant

## 2017-02-11 ENCOUNTER — Ambulatory Visit (HOSPITAL_COMMUNITY)
Admission: RE | Admit: 2017-02-11 | Discharge: 2017-02-11 | Disposition: A | Discharge status: Home / Self Care | Payer: Medicaid Other | Source: Ambulatory Visit | Attending: Physician Assistant | Admitting: Physician Assistant

## 2017-02-11 ENCOUNTER — Encounter (INDEPENDENT_AMBULATORY_CARE_PROVIDER_SITE_OTHER): Payer: Self-pay | Admitting: Physician Assistant

## 2017-02-11 VITALS — BP 141/84 | HR 57 | Temp 97.8°F | Wt 270.2 lb

## 2017-02-11 DIAGNOSIS — S9782XA Crushing injury of left foot, initial encounter: Secondary | ICD-10-CM

## 2017-02-11 DIAGNOSIS — X58XXXA Exposure to other specified factors, initial encounter: Secondary | ICD-10-CM | POA: Diagnosis not present

## 2017-02-11 MED ORDER — DICLOFENAC SODIUM 75 MG PO TBEC: 75.0000 mg | DELAYED_RELEASE_TABLET | Freq: Two times a day (BID) | ORAL | 0 refills | Status: DC

## 2017-02-11 NOTE — Progress Notes (Signed)
Subjective:  Patient ID: Shannon Lopez, female    DOB: 11/26/1990  Age: 26 y.o. MRN: [id number]  CC: left foot pain  HPI Shannon Lopez is a 26 y.o. female with a PMH of of asthma, depression, and anxiety presents with left 5th toe pain since dropping a full jar of pickles on her toe two days ago. She is unable to bear weight on digit. 5th toe is currently swollen and very tender to palpation. Has not taken anything for reliefand has not iced the foot, however, she has elevated her foot. May have mildly injured 4th toe of left foot also but pain in very mild. Denies any other injury along the left lower extremity. Denies cyanosis, tingling or numbness.   Outpatient Medications Prior to Visit  Medication Sig Dispense Refill  . albuterol (PROVENTIL HFA;VENTOLIN HFA) 108 (90 Base) MCG/ACT inhaler Inhale 2 puffs into the lungs every 4 (four) hours as needed for wheezing or shortness of breath. 1 Inhaler 5  . budesonide-formoterol (SYMBICORT) 160-4.5 MCG/ACT inhaler Inhale 2 puffs into the lungs 2 (two) times daily. 1 Inhaler 3  . fexofenadine (ALLEGRA) 180 MG tablet Take 180 mg by mouth daily.    . metFORMIN (GLUCOPHAGE) 500 MG tablet Take 1 tablet (500 mg total) by mouth 3 (three) times daily as needed. 90 tablet 3  . montelukast (SINGULAIR) 10 MG tablet Take 1 tablet (10 mg total) by mouth at bedtime. 90 tablet 3   No facility-administered medications prior to visit.      ROS Review of Systems  Constitutional: Negative for chills, fever and malaise/fatigue.  Eyes: Negative for blurred vision.  Respiratory: Negative for shortness of breath.   Cardiovascular: Negative for chest pain and palpitations.  Gastrointestinal: Negative for abdominal pain and nausea.  Genitourinary: Negative for dysuria and hematuria.  Musculoskeletal: Negative for joint pain and myalgias.       Left foot pain  Skin: Negative for rash.  Neurological: Negative for tingling and headaches.  Psychiatric/Behavioral:  Negative for depression. The patient is not nervous/anxious.     Objective:  BP (!) 141/84 (BP Location: Right Arm, Patient Position: Sitting, Cuff Size: Large)   Pulse (!) 57   Temp 97.8 F (36.6 C) (Oral)   Wt 270 lb 3.2 oz (122.6 kg)   LMP 12/30/2016 (Approximate)   SpO2 100%   BMI 46.38 kg/m   BP/Weight 02/11/2017 01/14/2017 01/04/2017  Systolic BP 141 124 132  Diastolic BP 84 85 86  Wt. (Lbs) 270.2 268.6 268.6  BMI 46.38 46.11 46.11      Physical Exam  Constitutional: She is oriented to person, place, and time.  Well developed, obese, NAD, polite  HENT:  Head: Normocephalic and atraumatic.  Eyes: No scleral icterus.  Cardiovascular: Normal rate, regular rhythm and normal heart sounds.   Pulmonary/Chest: Effort normal and breath sounds normal.  Musculoskeletal: She exhibits no edema.  Left 5th toe edematous, erythematous, and very tender to palpation.  Neurological: She is alert and oriented to person, place, and time. No cranial nerve deficit. Coordination normal.  Severe limp favoring left side  Skin: Skin is warm and dry. No rash noted. No erythema. No pallor.  Psychiatric: She has a normal mood and affect. Her behavior is normal. Thought content normal.  Vitals reviewed.    Assessment & Plan:   1. Crushing injury of left foot, initial encounter - DG Foot Complete Left; Future - Begin diclofenac (VOLTAREN) 75 MG EC tablet; Take 1 tablet (75 mg total) by mouth  2 (two) times daily.  Dispense: 20 tablet; Refill: 0 - DME Crutches   Meds ordered this encounter  Medications  . diclofenac (VOLTAREN) 75 MG EC tablet    Sig: Take 1 tablet (75 mg total) by mouth 2 (two) times daily.    Dispense:  20 tablet    Refill:  0    Order Specific Question:   Supervising Provider    Answer:   Quentin AngstJEGEDE, OLUGBEMIGA E [id number]    Follow-up: Return if symptoms worsen or fail to improve.   Loletta Specteroger David Berlene Dixson PA

## 2017-02-11 NOTE — Patient Instructions (Addendum)
How to Buddy Tape Buddy taping refers to taping an injured finger or toe to an uninjured finger or toe that is next to it. This protects the injured finger or toe and keeps it from moving while the injury heals. You may buddy tape a finger or toe if you have a minor sprain. Your health care provider may buddy tape your finger or toe if you have a sprain, dislocation, or fracture. You may be told to replace your buddy taping as needed. What are the risks? Generally, buddy taping is safe. However, problems may occur, such as:  Skin injury or infection.  Reduced blood flow to the finger or toe.  Skin reaction to the tape.  Do not buddy tape your toe if you have diabetes. Do not buddy tape if you know that you have an allergy to adhesives or surgical tape. How to buddy tape Before Buddy Taping Try to reduce any pain and swelling with rest, icing, and elevation:  Avoid any activity that causes pain.  Raise (elevate) your hand or foot above the level of your heart while you are sitting or lying down.  If directed, apply ice to the injured area: ? Put ice in a plastic bag. ? Place a towel between your skin and the bag. ? Leave the ice on for 20 minutes, 2-3 times per day.  Buddy Taping Procedure  Clean and dry your finger or toe as told by your health care provider.  Place a gauze pad or a piece of cloth or cotton between your injured finger or toe and the uninjured finger or toe.  Use tape to wrap around both fingers or toes so your injured finger or toe is secured to the uninjured finger or toe. ? The tape should be snug, but not tight. ? Make sure the ends of the piece of tape overlap. ? Avoid placing tape directly over the joint.  Change the tape and the padding as told by your health care provider. Remove and replace the tape or padding if it becomes loose, worn, dirty, or wet. After Buddy Taping  Take over-the-counter and prescription medicines only as told by your health care  provider.  Return to your normal activities as told by your health care provider. Ask your health care provider what activities are safe for you.  Watch the buddy-taped area and always remove buddy taping if: ? Your pain gets worse. ? Your fingers turn pale or blue. ? Your skin becomes irritated. Contact a health care provider if:  You have pain, swelling, or bruising that lasts longer than three days.  You have a fever.  Your skin is red, cracked, or irritated. Get help right away if:  The injured area becomes cold, numb, or pale.  You have severe pain, swelling, bruising, or loss of movement in your finger or toe.  Your finger or toe changes shape (deformity). This information is not intended to replace advice given to you by your health care provider. Make sure you discuss any questions you have with your health care provider. Document Released: 08/13/2004 Document Revised: 12/12/2015 Document Reviewed: 11/28/2014 Elsevier Interactive Patient Education  2018 [employer] Inc[employer] RICE for Routine Care of Injuries Many injuries can be cared for using rest, ice, compression, and elevation (RICE therapy). Using RICE therapy can help to lessen pain and swelling. It can help your body to heal. Rest Reduce your normal activities and avoid using the injured part of your body. You can go back to your normal  activities when you feel okay and your doctor says it is okay. Ice Do not put ice on your bare skin.  Put ice in a plastic bag.  Place a towel between your skin and the bag.  Leave the ice on for 20 minutes, 2-3 times a day.  Do this for as long as told by your doctor. Compression Compression means putting pressure on the injured area. This can be done with an elastic bandage. If an elastic bandage has been applied:  Remove and reapply the bandage every 3-4 hours or as told by your doctor.  Make sure the bandage is not wrapped too tight. Wrap the bandage more loosely if part of  your body beyond the bandage is blue, swollen, cold, painful, or loses feeling (numb).  See your doctor if the bandage seems to make your problems worse.  Elevation Elevation means keeping the injured area raised. Raise the injured area above your heart or the center of your chest if you can. When should I get help? You should get help if:  You keep having pain and swelling.  Your symptoms get worse.  Get help right away if: You should get help right away if:  You have sudden bad pain at or below the area of your injury.  You have redness or more swelling around your injury.  You have tingling or numbness at or below the injury that does not go away when you take off the bandage.  This information is not intended to replace advice given to you by your health care provider. Make sure you discuss any questions you have with your health care provider. Document Released: 12/23/2007 Document Revised: 06/02/2016 Document Reviewed: 06/13/2014 Elsevier Interactive Patient Education  2017 [employer]

## 2017-02-18 ENCOUNTER — Encounter: Payer: Self-pay | Admitting: Family Medicine

## 2017-02-18 ENCOUNTER — Other Ambulatory Visit (HOSPITAL_COMMUNITY)
Admission: RE | Admit: 2017-02-18 | Discharge: 2017-02-18 | Disposition: A | Discharge status: Home / Self Care | Payer: Medicaid Other | Source: Ambulatory Visit | Attending: Family Medicine | Admitting: Family Medicine

## 2017-02-18 ENCOUNTER — Ambulatory Visit (INDEPENDENT_AMBULATORY_CARE_PROVIDER_SITE_OTHER): Payer: Medicaid - Out of State | Admitting: Family Medicine

## 2017-02-18 VITALS — BP 118/62 | HR 63 | Wt 266.5 lb

## 2017-02-18 DIAGNOSIS — Z124 Encounter for screening for malignant neoplasm of cervix: Secondary | ICD-10-CM

## 2017-02-18 DIAGNOSIS — Z113 Encounter for screening for infections with a predominantly sexual mode of transmission: Secondary | ICD-10-CM | POA: Diagnosis not present

## 2017-02-18 DIAGNOSIS — R938 Abnormal findings on diagnostic imaging of other specified body structures: Secondary | ICD-10-CM

## 2017-02-18 DIAGNOSIS — R9389 Abnormal findings on diagnostic imaging of other specified body structures: Secondary | ICD-10-CM

## 2017-02-18 DIAGNOSIS — E282 Polycystic ovarian syndrome: Secondary | ICD-10-CM | POA: Diagnosis not present

## 2017-02-18 LAB — POCT PREGNANCY, URINE: Preg Test, Ur: NEGATIVE

## 2017-02-18 MED ORDER — MEDROXYPROGESTERONE ACETATE 10 MG PO TABS: 10.0000 mg | ORAL_TABLET | Freq: Every day | ORAL | 3 refills | Status: DC

## 2017-02-18 NOTE — Progress Notes (Signed)
Obstetrics and Gynecology Visit New Patient Evaluation  Appointment Date: 02/18/2017  Primary Care Provider: Loletta SpecterGomez, Roger David    Chief Complaint: Polycystic ovarian syndrome  History of Present Illness:  Shannon Lopez is a 26 y.o. G1P0010 referred by PCP for PCOS. U/S on 01/11/17 showed bilateral polycystic ovarian morphology and several cystic foci within endometrium. She was given Metformin 500 TID by her PCP. She says that she misses doses about every other day because she cannot tolerate Metformin on an empty stomach. LMP 09/02/2016. Since February, she has had occasional spotting. Menarche at age 26. She has had regular 5-day periods every 31 days except for one year at age 26. June 2017-October 2017 she had a Nexplanon placed for birth control but removed it due to constant bleeding. She is married and only has sex with her husband. They do not use contraception. They have been trying to conceive since October 2017 without success. Her partner has not fathered any children. She has had significant weight gain since having the Nexplanon put in. She denies any problems with acne or symptoms of hirsutism. She has history of asthma and takes Singulair.   Review of Systems: 12 point review of systems is negative or as noted in the History of Present Illness.  The following portions of the patient's history were reviewed and updated as appropriate: allergies, current medications, past family history, past medical history, past social history, past surgical history and problem list.  Patient Active Problem List   Diagnosis Date Noted  . Polycystic ovaries 02/18/2017  . Asthma 05/20/2014   Medications: Singular, Metformin  Allergies: is allergic to latex.   Physical Exam:  BP 118/62   Pulse 63   Wt 266 lb 8 oz (120.9 kg)   LMP 09/02/2016   BMI 45.74 kg/m  Body mass index is 45.74 kg/m. General appearance: Well nourished, well developed female in no acute distress.  Heart: Regular  rate and rhythm, no murmurs, rubs, or gallops Lungs: Clear to auscultation Abdomen: diffusely non tender to palpation, non distended, and no masses, hernias Neuro/Psych:  Normal mood and affect.      Assessment and Plan:  1. Polycystic ovaries Will start Provera 10 mg QD to start menstruation Continue Metformin 500 mg TID   2. Endometrial thickening on ultrasound UPT negative, endometrial biopsy complete today - Surgical pathology  3. Screening for cervical cancer Pap completed today - Cytology - PAP    Jeanie CooksSarah Shanique Aslinger, Medical Student

## 2017-02-18 NOTE — Progress Notes (Signed)
Only taken metformin bid

## 2017-02-18 NOTE — Patient Instructions (Signed)
Polycystic Ovarian Syndrome °Polycystic ovarian syndrome (PCOS) is a common hormonal disorder among women of reproductive age. In most women with PCOS, many small fluid-filled sacs (cysts) grow on the ovaries, and the cysts are not part of a normal menstrual cycle. PCOS can cause problems with your menstrual periods and make it difficult to get pregnant. It can also cause an increased risk of miscarriage with pregnancy. If it is not treated, PCOS can lead to serious health problems, such as diabetes and heart disease. °What are the causes? °The cause of PCOS is not known, but it may be the result of a combination of certain factors, such as: °· Irregular menstrual cycle. °· High levels of certain hormones (androgens). °· Problems with the hormone that helps to control blood sugar (insulin resistance). °· Certain genes. ° °What increases the risk? °This condition is more likely to develop in women who have a family history of PCOS. °What are the signs or symptoms? °Symptoms of PCOS may include: °· Multiple ovarian cysts. °· Infrequent periods or no periods. °· Periods that are too frequent or too heavy. °· Unpredictable periods. °· Inability to get pregnant (infertility) because of not ovulating. °· Increased growth of hair on the face, chest, stomach, back, thumbs, thighs, or toes. °· Acne or oily skin. Acne may develop during adulthood, and it may not respond to treatment. °· Pelvic pain. °· Weight gain or obesity. °· Patches of thickened and dark brown or black skin on the neck, arms, breasts, or thighs (acanthosis nigricans). °· Excess hair growth on the face, chest, abdomen, or upper thighs (hirsutism). ° °How is this diagnosed? °This condition is diagnosed based on: °· Your medical history. °· A physical exam, including a pelvic exam. Your health care provider may look for areas of increased hair growth on your skin. °· Tests, such as: °? Ultrasound. This may be used to examine the ovaries and the lining of the  uterus (endometrium) for cysts. °? Blood tests. These may be used to check levels of sugar (glucose), female hormone (testosterone), and female hormones (estrogen and progesterone) in your blood. ° °How is this treated? °There is no cure for PCOS, but treatment can help to manage symptoms and prevent more health problems from developing. Treatment varies depending on: °· Your symptoms. °· Whether you want to have a baby or whether you need birth control (contraception). ° °Treatment may include nutrition and lifestyle changes along with: °· Progesterone hormone to start a menstrual period. °· Birth control pills to help you have regular menstrual periods. °· Medicines to make you ovulate, if you want to get pregnant. °· Medicine to reduce excessive hair growth. °· Surgery, in severe cases. This may involve making small holes in one or both of your ovaries. This decreases the amount of testosterone that your body produces. ° °Follow these instructions at home: °· Take over-the-counter and prescription medicines only as told by your health care provider. °· Follow a healthy meal plan. This can help you reduce the effects of PCOS. °? Eat a healthy diet that includes lean proteins, complex carbohydrates, fresh fruits and vegetables, low-fat dairy products, and healthy fats. Make sure to eat enough fiber. °· If you are overweight, lose weight as told by your health care provider. °? Losing 10% of your body weight may improve symptoms. °? Your health care provider can determine how much weight loss is best for you and can help you lose weight safely. °· Keep all follow-up visits as told by   your health care provider. This is important. °Contact a health care provider if: °· Your symptoms do not get better with medicine. °· You develop new symptoms. °This information is not intended to replace advice given to you by your health care provider. Make sure you discuss any questions you have with your health care  provider. °Document Released: 10/30/2004 Document Revised: 03/03/2016 Document Reviewed: 12/22/2015 °Elsevier Interactive Patient Education © 2018 Elsevier Inc. ° °

## 2017-02-22 ENCOUNTER — Ambulatory Visit (INDEPENDENT_AMBULATORY_CARE_PROVIDER_SITE_OTHER): Payer: Medicaid Other | Admitting: Physician Assistant

## 2017-02-22 ENCOUNTER — Telehealth (INDEPENDENT_AMBULATORY_CARE_PROVIDER_SITE_OTHER): Payer: Self-pay | Admitting: Physician Assistant

## 2017-02-22 NOTE — Telephone Encounter (Signed)
Sorry, I have already written off for a week. It would have been good to keep the appointment so I may reevaluate her healing process and see if there is a need for further orders. I don't write off work unless I see the patient in clinic when the work note is requested.

## 2017-02-22 NOTE — Telephone Encounter (Signed)
Rescheduled for 8/16. Shannon Lopez, CMA

## 2017-02-22 NOTE — Telephone Encounter (Signed)
Pt has an appointment today at 10:30am . Pt wants to canceled the appointment and instead she wants PA Lily KocherGomez to write a note to work regarding her left toe she can't even wear her shoe and is painful  with 02-21-17 date . Please, call her back  Thank you

## 2017-02-22 NOTE — Telephone Encounter (Signed)
FWD to PCP. Tempestt S Roberts, CMA  

## 2017-02-23 LAB — CYTOLOGY - PAP
CHLAMYDIA, DNA PROBE: NEGATIVE
Diagnosis: NEGATIVE
Neisseria Gonorrhea: NEGATIVE

## 2017-03-04 ENCOUNTER — Ambulatory Visit (INDEPENDENT_AMBULATORY_CARE_PROVIDER_SITE_OTHER): Payer: Medicaid - Out of State | Admitting: Physician Assistant

## 2017-03-04 ENCOUNTER — Encounter (INDEPENDENT_AMBULATORY_CARE_PROVIDER_SITE_OTHER): Payer: Self-pay | Admitting: Physician Assistant

## 2017-03-04 VITALS — BP 130/72 | HR 60 | Temp 97.9°F | Wt 270.0 lb

## 2017-03-04 DIAGNOSIS — S97102A Crushing injury of unspecified left toe(s), initial encounter: Secondary | ICD-10-CM | POA: Diagnosis not present

## 2017-03-04 MED ORDER — CELECOXIB 200 MG PO CAPS: 200.0000 mg | ORAL_CAPSULE | Freq: Two times a day (BID) | ORAL | 1 refills | Status: DC

## 2017-03-04 NOTE — Progress Notes (Signed)
Subjective:  Patient ID: Shannon Lopez, female    DOB: [birth year]  Age: 26 y.o. MRN: [id number]  CC: toe pain  HPI Shannon Hamzais a 26 y.o.femalewith a PMH of of asthma, depression, and anxiety presents with left 5th toe pain since dropping a full jar of pickles on her toe on 02/09/17. XR left foot on 02/11/17 found no acute abnormality. Diclofenac 75 mg had been prescribed but only took for one or two days due to nausea. Has taken tylenol which helps, "kind of, I don't know".  Pain persists only when the toe is touched. She can bear weight without difficulty. Says wearing closed toe shoes and her protective work shoes cause severe pain because of the rubbing on the affected toe. Pain described as throbbing.     Outpatient Medications Prior to Visit  Medication Sig Dispense Refill  . albuterol (PROVENTIL HFA;VENTOLIN HFA) 108 (90 Base) MCG/ACT inhaler Inhale 2 puffs into the lungs every 4 (four) hours as needed for wheezing or shortness of breath. 1 Inhaler 5  . budesonide-formoterol (SYMBICORT) 160-4.5 MCG/ACT inhaler Inhale 2 puffs into the lungs 2 (two) times daily. 1 Inhaler 3  . diclofenac (VOLTAREN) 75 MG EC tablet Take 1 tablet (75 mg total) by mouth 2 (two) times daily. (Patient not taking: Reported on 02/18/2017) 20 tablet 0  . fexofenadine (ALLEGRA) 180 MG tablet Take 180 mg by mouth daily.    . medroxyPROGESTERone (PROVERA) 10 MG tablet Take 1 tablet (10 mg total) by mouth daily. Take for 10 days monthly to have cycle 10 tablet 3  . metFORMIN (GLUCOPHAGE) 500 MG tablet Take 1 tablet (500 mg total) by mouth 3 (three) times daily as needed. 90 tablet 3  . montelukast (SINGULAIR) 10 MG tablet Take 1 tablet (10 mg total) by mouth at bedtime. 90 tablet 3   No facility-administered medications prior to visit.      ROS Review of Systems  Constitutional: Negative for chills, fever and malaise/fatigue.  Eyes: Negative for blurred vision.  Respiratory: Negative for shortness of breath.    Cardiovascular: Negative for chest pain and palpitations.  Gastrointestinal: Negative for abdominal pain and nausea.  Genitourinary: Negative for dysuria and hematuria.  Musculoskeletal: Negative for joint pain and myalgias.  Skin: Negative for rash.  Neurological: Negative for tingling and headaches.  Psychiatric/Behavioral: Negative for depression. The patient is not nervous/anxious.     Objective:  BP 130/72 (BP Location: Right Arm, Patient Position: Sitting, Cuff Size: Large)   Pulse 60   Temp 97.9 F (36.6 C) (Oral)   Wt 270 lb (122.5 kg)   LMP 02/17/2017 (Exact Date)   SpO2 99%   BMI 46.35 kg/m   BP/Weight 03/04/2017 02/18/2017 02/11/2017  Systolic BP 130 118 141  Diastolic BP 72 62 84  Wt. (Lbs) 270 266.5 270.2  BMI 46.35 45.74 46.38      Physical Exam  Constitutional: She is oriented to person, place, and time.  Well developed, obese, NAD, polite  Musculoskeletal:  Full aROM of the left 5th toe  Neurological: She is alert and oriented to person, place, and time.  Mildly reduced light touch sensation on the left 5th toe.  Skin: Skin is warm and dry. No rash noted. No erythema. No pallor.  Left 5th toe without erythema, edema, ecchymosis, cyanosis, scaling, crusting, bleeding, or suppuration.  Psychiatric: Her behavior is normal. Thought content normal.  Somewhat depressed mood  Vitals reviewed.    Assessment & Plan:   1. Crushing injury of toe  of left foot, initial encounter - Begin celecoxib (CELEBREX) 200 MG capsule; Take 1 capsule (200 mg total) by mouth 2 (two) times daily.  Dispense: 20 capsule; Refill: 1 - Will be referred to occupational or physical therapy if pain persists.  Meds ordered this encounter  Medications  . celecoxib (CELEBREX) 200 MG capsule    Sig: Take 1 capsule (200 mg total) by mouth 2 (two) times daily.    Dispense:  20 capsule    Refill:  1    Order Specific Question:   Supervising Provider    Answer:   Quentin AngstJEGEDE, OLUGBEMIGA E  [[id number]]    Follow-up: 8.22.18  Loletta Specteroger David Gomez PA

## 2018-03-11 ENCOUNTER — Encounter (HOSPITAL_COMMUNITY): Payer: Self-pay

## 2018-03-11 ENCOUNTER — Emergency Department (HOSPITAL_COMMUNITY): Payer: Medicaid Other

## 2018-03-11 ENCOUNTER — Other Ambulatory Visit: Payer: Self-pay

## 2018-03-11 ENCOUNTER — Emergency Department (HOSPITAL_COMMUNITY)
Admission: EM | Admit: 2018-03-11 | Discharge: 2018-03-11 | Disposition: A | Discharge status: Home / Self Care | Payer: Medicaid Other | Attending: Emergency Medicine | Admitting: Emergency Medicine

## 2018-03-11 DIAGNOSIS — Z87891 Personal history of nicotine dependence: Secondary | ICD-10-CM | POA: Insufficient documentation

## 2018-03-11 DIAGNOSIS — Z3A12 12 weeks gestation of pregnancy: Secondary | ICD-10-CM | POA: Diagnosis not present

## 2018-03-11 DIAGNOSIS — Z79899 Other long term (current) drug therapy: Secondary | ICD-10-CM | POA: Insufficient documentation

## 2018-03-11 DIAGNOSIS — O039 Complete or unspecified spontaneous abortion without complication: Secondary | ICD-10-CM | POA: Insufficient documentation

## 2018-03-11 DIAGNOSIS — O209 Hemorrhage in early pregnancy, unspecified: Secondary | ICD-10-CM | POA: Diagnosis present

## 2018-03-11 DIAGNOSIS — J45909 Unspecified asthma, uncomplicated: Secondary | ICD-10-CM | POA: Diagnosis not present

## 2018-03-11 LAB — CBC
HEMATOCRIT: 41.4 % (ref 36.0–46.0)
Hemoglobin: 13.8 g/dL (ref 12.0–15.0)
MCH: 27.4 pg (ref 26.0–34.0)
MCHC: 33.3 g/dL (ref 30.0–36.0)
MCV: 82.1 fL (ref 78.0–100.0)
Platelets: 235 10*3/uL (ref 150–400)
RBC: 5.04 MIL/uL (ref 3.87–5.11)
RDW: 14.5 % (ref 11.5–15.5)
WBC: 3.4 10*3/uL — AB (ref 4.0–10.5)

## 2018-03-11 LAB — BASIC METABOLIC PANEL
ANION GAP: 12 (ref 5–15)
BUN: 11 mg/dL (ref 6–20)
CHLORIDE: 108 mmol/L (ref 98–111)
CO2: 22 mmol/L (ref 22–32)
Calcium: 9.2 mg/dL (ref 8.9–10.3)
Creatinine, Ser: 0.74 mg/dL (ref 0.44–1.00)
GFR calc non Af Amer: >60 mL/min (ref >60)
Glucose, Bld: 121 mg/dL — ABNORMAL HIGH (ref 70–99)
POTASSIUM: 3.5 mmol/L (ref 3.5–5.1)
SODIUM: 142 mmol/L (ref 135–145)

## 2018-03-11 LAB — I-STAT BETA HCG BLOOD, ED (MC, WL, AP ONLY): HCG, QUANTITATIVE: 540.8 m[IU]/mL — AB (ref <5)

## 2018-03-11 MED ORDER — MORPHINE SULFATE (PF) 4 MG/ML IV SOLN
4.0000 mg | Freq: Once | INTRAVENOUS | Status: DC
  Filled 2018-03-11: qty 1

## 2018-03-11 MED ORDER — SODIUM CHLORIDE 0.9 % IV BOLUS
1000.0000 mL | Freq: Once | INTRAVENOUS | Status: AC
  Administered 2018-03-11: 1000 mL via INTRAVENOUS

## 2018-03-11 MED ORDER — ACETAMINOPHEN 325 MG PO TABS
650.0000 mg | ORAL_TABLET | Freq: Once | ORAL | Status: AC
  Administered 2018-03-11: 650 mg via ORAL
  Filled 2018-03-11: qty 2

## 2018-03-11 NOTE — Discharge Instructions (Addendum)
Follow-up with your obstetrician on Monday as scheduled  Return to the ER in the meantime as needed for any new or worsening symptoms

## 2018-03-11 NOTE — ED Triage Notes (Signed)
Pt arrived via POV escorted with spouse. Pt reports that she is approx. 3 months pregnant. Pt reports that she has had vaginal bleeding that started yesterday and this morning she awoke to a pool of blood. Pt reports 10/10 abdominal pain and reports that she feels that she is dying.Pt reports 7 soaked pads in the past 24 hours. Pt reports that she did not call her provider. Dr. Nicki Reapereports that she [facility], and reports that she sees Dr. Janifer AdieStacy Penn.  Pt reporst that she took naproxen for pain.

## 2018-03-11 NOTE — ED Notes (Addendum)
Patient reports that she was being [facility] and was told 2 weeks ago that she was miscarrying. Today, the patient arrives with moderate amount of light red vaginal drainage and a small amount of tan drainage. Patient c/o abdominal cramping and feeling like she has to have a BM.

## 2018-03-11 NOTE — ED Notes (Signed)
Scant bleeding is noted  on sanitary napkin.

## 2018-03-11 NOTE — ED Provider Notes (Signed)
[facility]-EMERGENCY DEPT Provider Note   CSN: [id number] Arrival date & time: 03/11/18  1125     History   Chief Complaint Chief Complaint  Patient presents with  . Vaginal Bleeding  . 3 months pregnacy    HPI Shannon Lopez is a 27 y.o. female.  HPI Patient is a 27 year old female who has known fetal demise and is scheduled for D&C this coming Monday.  She presents today because of severe crampy abdominal pain and vaginal bleeding.  No lightheadedness.  No chest pain or shortness of breath.  No fevers or chills.  No urinary complaints.  Symptoms are mild to moderate in severity.  It  Past Medical History:  Diagnosis Date  . Allergy   . Asthma   . Depression 06/18/2015  . Morbid obesity with BMI of 40.0-44.9, adult [facility](HCC)     Patient Active Problem List   Diagnosis Date Noted  . Polycystic ovaries 02/18/2017  . Asthma 05/20/2014    History reviewed. No pertinent surgical history.   OB History    Gravida  2   Para      Term  0   Preterm  0   AB  1   Living  0     SAB      TAB      Ectopic      Multiple      Live Births               Home Medications    Prior to Admission medications   Medication Sig Start Date End Date Taking? Authorizing Provider  acetaminophen (TYLENOL) 500 MG tablet Take 500 mg by mouth every 6 (six) hours as needed for mild pain or headache.   Yes [provider]  albuterol (PROVENTIL HFA;VENTOLIN HFA) 108 (90 Base) MCG/ACT inhaler Inhale 2 puffs into the lungs every 4 (four) hours as needed for wheezing or shortness of breath. 01/04/17 03/11/18 Yes Loletta SpecterGomez, Roger David, PA-C  cetirizine (ZYRTEC) 10 MG tablet Take 10 mg by mouth daily.   Yes [provider]  fexofenadine (ALLEGRA) 180 MG tablet Take 180 mg by mouth daily as needed for allergies.    Yes [provider]  Fluticasone Furoate (ARNUITY ELLIPTA) 100 MCG/ACT AEPB Inhale 1 puff into the lungs at bedtime.   Yes [provider]  montelukast (SINGULAIR) 10 MG tablet Take 1 tablet (10 mg total) by mouth at bedtime. 04/19/15  Yes Jeffery, Chelle, PA-C  naproxen (NAPROSYN) 500 MG tablet Take 1,000 mg by mouth daily as needed for mild pain.   Yes [provider]  metFORMIN (GLUCOPHAGE) 500 MG tablet Take 1 tablet (500 mg total) by mouth 3 (three) times daily as needed. Patient not taking: Reported on 03/11/2018 01/14/17   Loletta SpecterGomez, Roger David, PA-C    Family History Family History  Problem Relation Age of Onset  . Hyperlipidemia Father   . Heart disease Father   . Diabetes Father   . Irritable bowel syndrome Father   . Diabetes Maternal Grandmother        both sides of the family  . Irritable bowel syndrome Sister     Social History Social History   Tobacco Use  . Smoking status: Former Smoker    Types: Cigarettes  . Smokeless tobacco: Never Used  Substance Use Topics  . Alcohol use: No    Alcohol/week: 0.0 standard drinks  . Drug use: No     Allergies   Latex  Review of Systems Review of Systems  All other systems reviewed and are negative.    Physical Exam Updated Vital Signs BP (!) 115/99 (BP Location: Left Arm)   Pulse 77   Temp 97.8 F (36.6 C) (Oral)   Resp (!) 22   Ht 5\' 5"  (1.651 m)   Wt 122.5 kg   SpO2 100%   BMI 44.93 kg/m   Physical Exam  Constitutional: She is oriented to person, place, and time. She appears well-developed and well-nourished. No distress.  HENT:  Head: Normocephalic and atraumatic.  Eyes: EOM are normal.  Neck: Normal range of motion.  Cardiovascular: Normal rate, regular rhythm and normal heart sounds.  Pulmonary/Chest: Effort normal and breath sounds normal.  Abdominal: Soft. She exhibits no distension. There is no tenderness.  Musculoskeletal: Normal range of motion.  Neurological: She is alert and oriented to person, place, and time.  Skin: Skin is warm and dry.  Psychiatric: She has a normal mood and affect. Judgment normal.   Nursing note and vitals reviewed.    ED Treatments / Results  Labs (all labs ordered are listed, but only abnormal results are displayed) Labs Reviewed  CBC - Abnormal; Notable for the following components:      Result Value   WBC 3.4 (*)    All other components within normal limits  BASIC METABOLIC PANEL - Abnormal; Notable for the following components:   Glucose, Bld 121 (*)    All other components within normal limits  I-STAT BETA HCG BLOOD, ED ([facility], [facility], [facility] ONLY) - Abnormal; Notable for the following components:   I-stat hCG, quantitative 540.8 (*)    All other components within normal limits  URINALYSIS, ROUTINE W REFLEX MICROSCOPIC    EKG None  Radiology US Ob Comp < 14 Wks  Result Date: 03/11/2018 CLINICAL DATA:  First trimester pregnancy, vaginal bleeding. EXAM: OBSTETRIC <14 WK ULTRASOUND TECHNIQUE: Transabdominal ultrasound was performed for evaluation of the gestation as well as the maternal uterus and adnexal regions. COMPARISON:  Ultrasound of January 11, 2017. FINDINGS: Intrauterine gestational sac: Not visualized. Yolk sac:  Not visualized. Embryo:  Not visualized. Cardiac Activity: Not visualized. Subchorionic hemorrhage:  None visualized. Maternal uterus/adnexae: Ovaries appear normal. No free fluid is noted. IMPRESSION: No intrauterine gestational sac, yolk sac, fetal pole, or cardiac activity visualized. Differential considerations include intrauterine gestation too early to be sonographically visualized, spontaneous abortion, or ectopic pregnancy. Consider follow-up ultrasound in 14 days and serial quantitative beta HCG follow-up. Electronically Signed   By: Lupita Raider, M.D.   On: 03/11/2018 13:30   US Ob Transvaginal  Result Date: 03/11/2018 CLINICAL DATA:  First trimester pregnancy, vaginal bleeding. EXAM: OBSTETRIC <14 WK ULTRASOUND TECHNIQUE: Transabdominal ultrasound was performed for evaluation of the gestation as well as the maternal uterus and adnexal  regions. COMPARISON:  Ultrasound of January 11, 2017. FINDINGS: Intrauterine gestational sac: Not visualized. Yolk sac:  Not visualized. Embryo:  Not visualized. Cardiac Activity: Not visualized. Subchorionic hemorrhage:  None visualized. Maternal uterus/adnexae: Ovaries appear normal. No free fluid is noted. IMPRESSION: No intrauterine gestational sac, yolk sac, fetal pole, or cardiac activity visualized. Differential considerations include intrauterine gestation too early to be sonographically visualized, spontaneous abortion, or ectopic pregnancy. Consider follow-up ultrasound in 14 days and serial quantitative beta HCG follow-up. Electronically Signed   By: Lupita Raider, M.D.   On: 03/11/2018 13:30    Procedures Procedures (including critical care time)  Medications Ordered in ED Medications  morphine 4 MG/ML injection  4 mg (4 mg Intravenous Not Given 03/11/18 1214)  sodium chloride 0.9 % bolus 1,000 mL (1,000 mLs Intravenous New Bag/Given 03/11/18 1207)  acetaminophen (TYLENOL) tablet 650 mg (650 mg Oral Given 03/11/18 1225)     Initial Impression / Assessment and Plan / ED Course  I have reviewed the triage vital signs and the nursing notes.  Pertinent labs & imaging results that were available during my care of the patient were reviewed by me and considered in my medical decision making (see chart for details).     Patient's pain is resolved.  Her vaginal bleeding is resolved.  Ultrasound demonstrates absence of products of conception.  Likely spontaneous abortion.  Discharged home to follow-up with her obstetrician.  Will defer pelvic at this time given resolution of bleeding resolution of pain.  Final Clinical Impressions(s) / ED Diagnoses   Final diagnoses:  Spontaneous abortion    ED Discharge Orders    None       Azalia Bilis, MD 03/11/18 1359

## 2018-03-17 IMAGING — CR DG CHEST 2V
2 series · 2 of 2 positions shown · non-contrast
Comparison: May 16, 2016

CLINICAL DATA: Chest pain

EXAM:
CHEST  2 VIEW

[w chest pa]
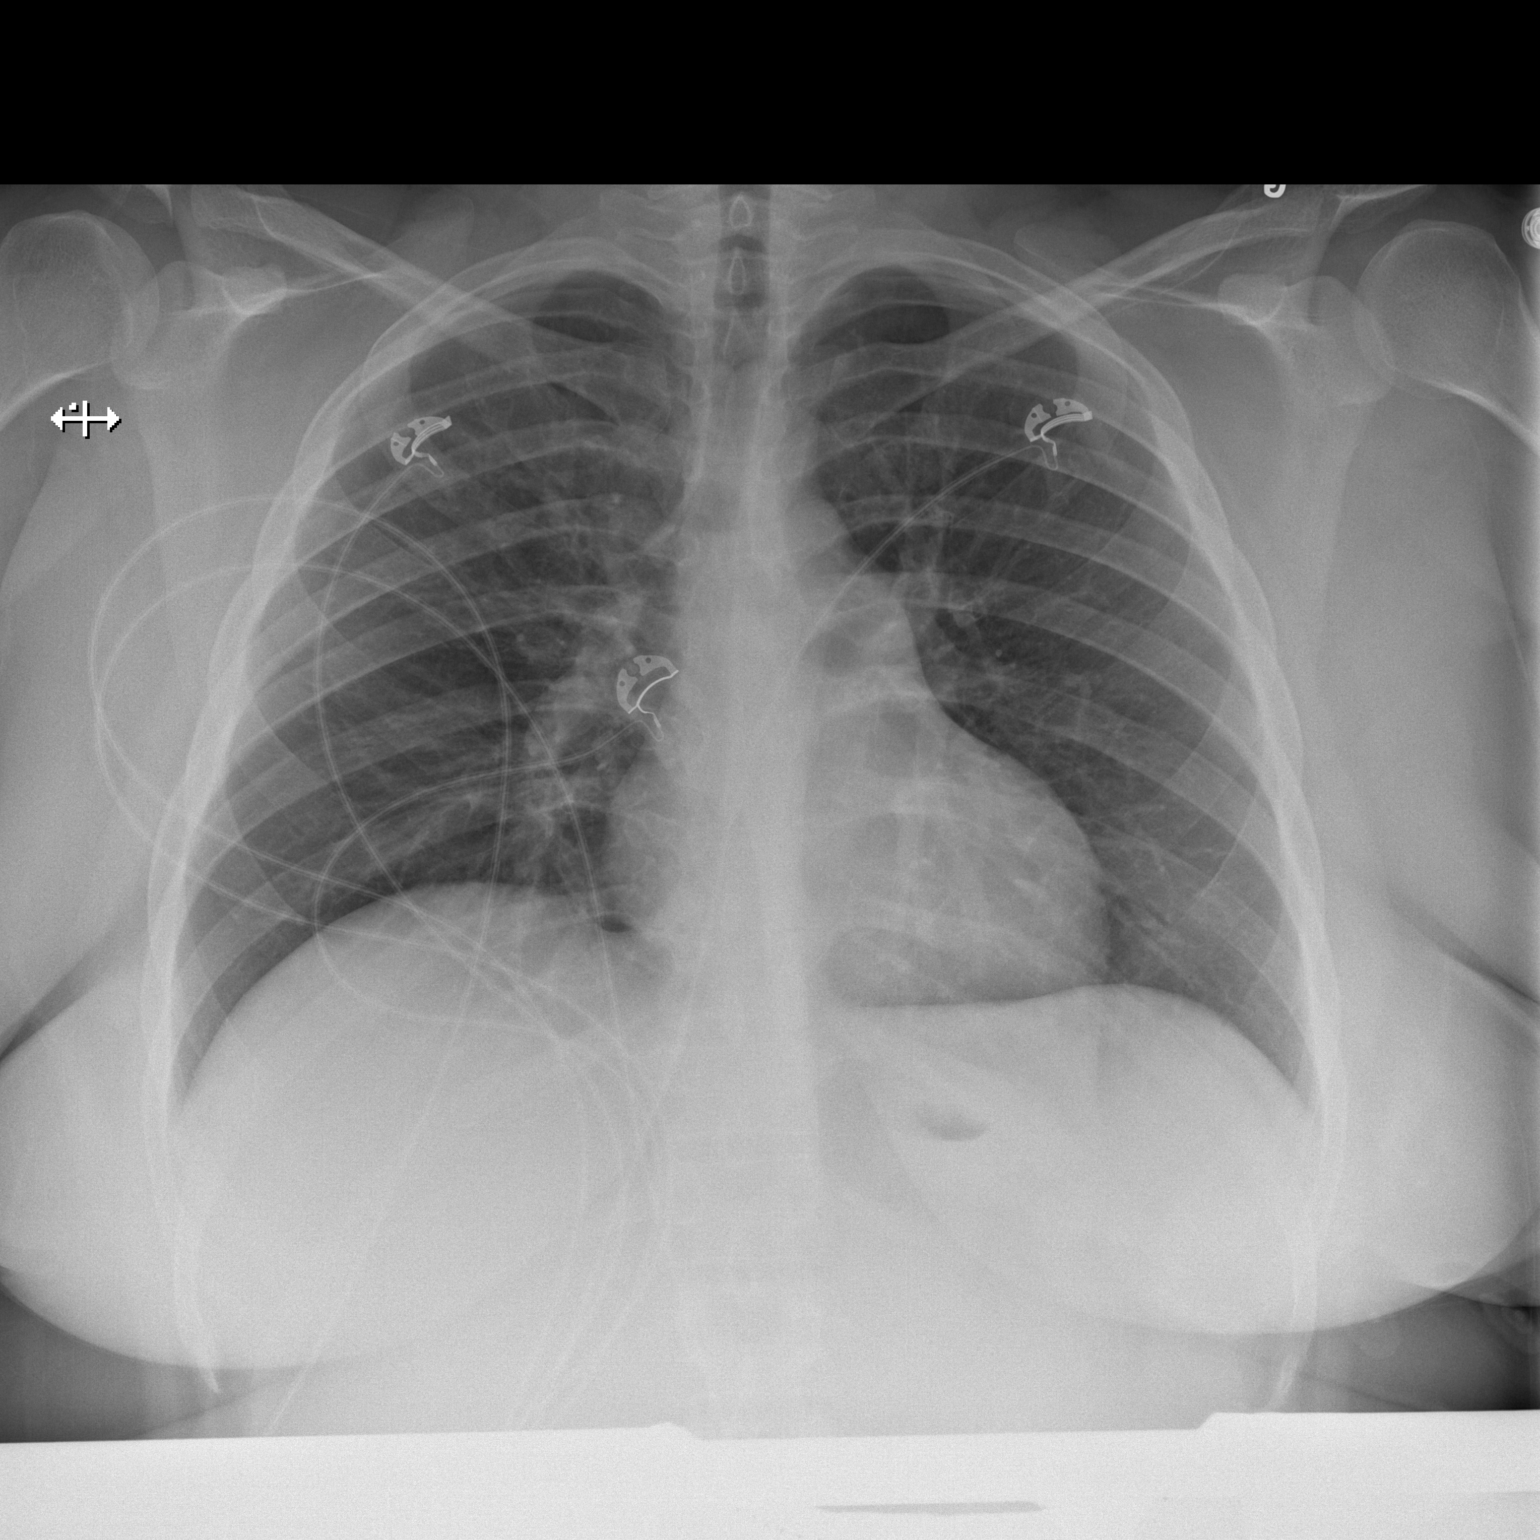

[w chest lat]
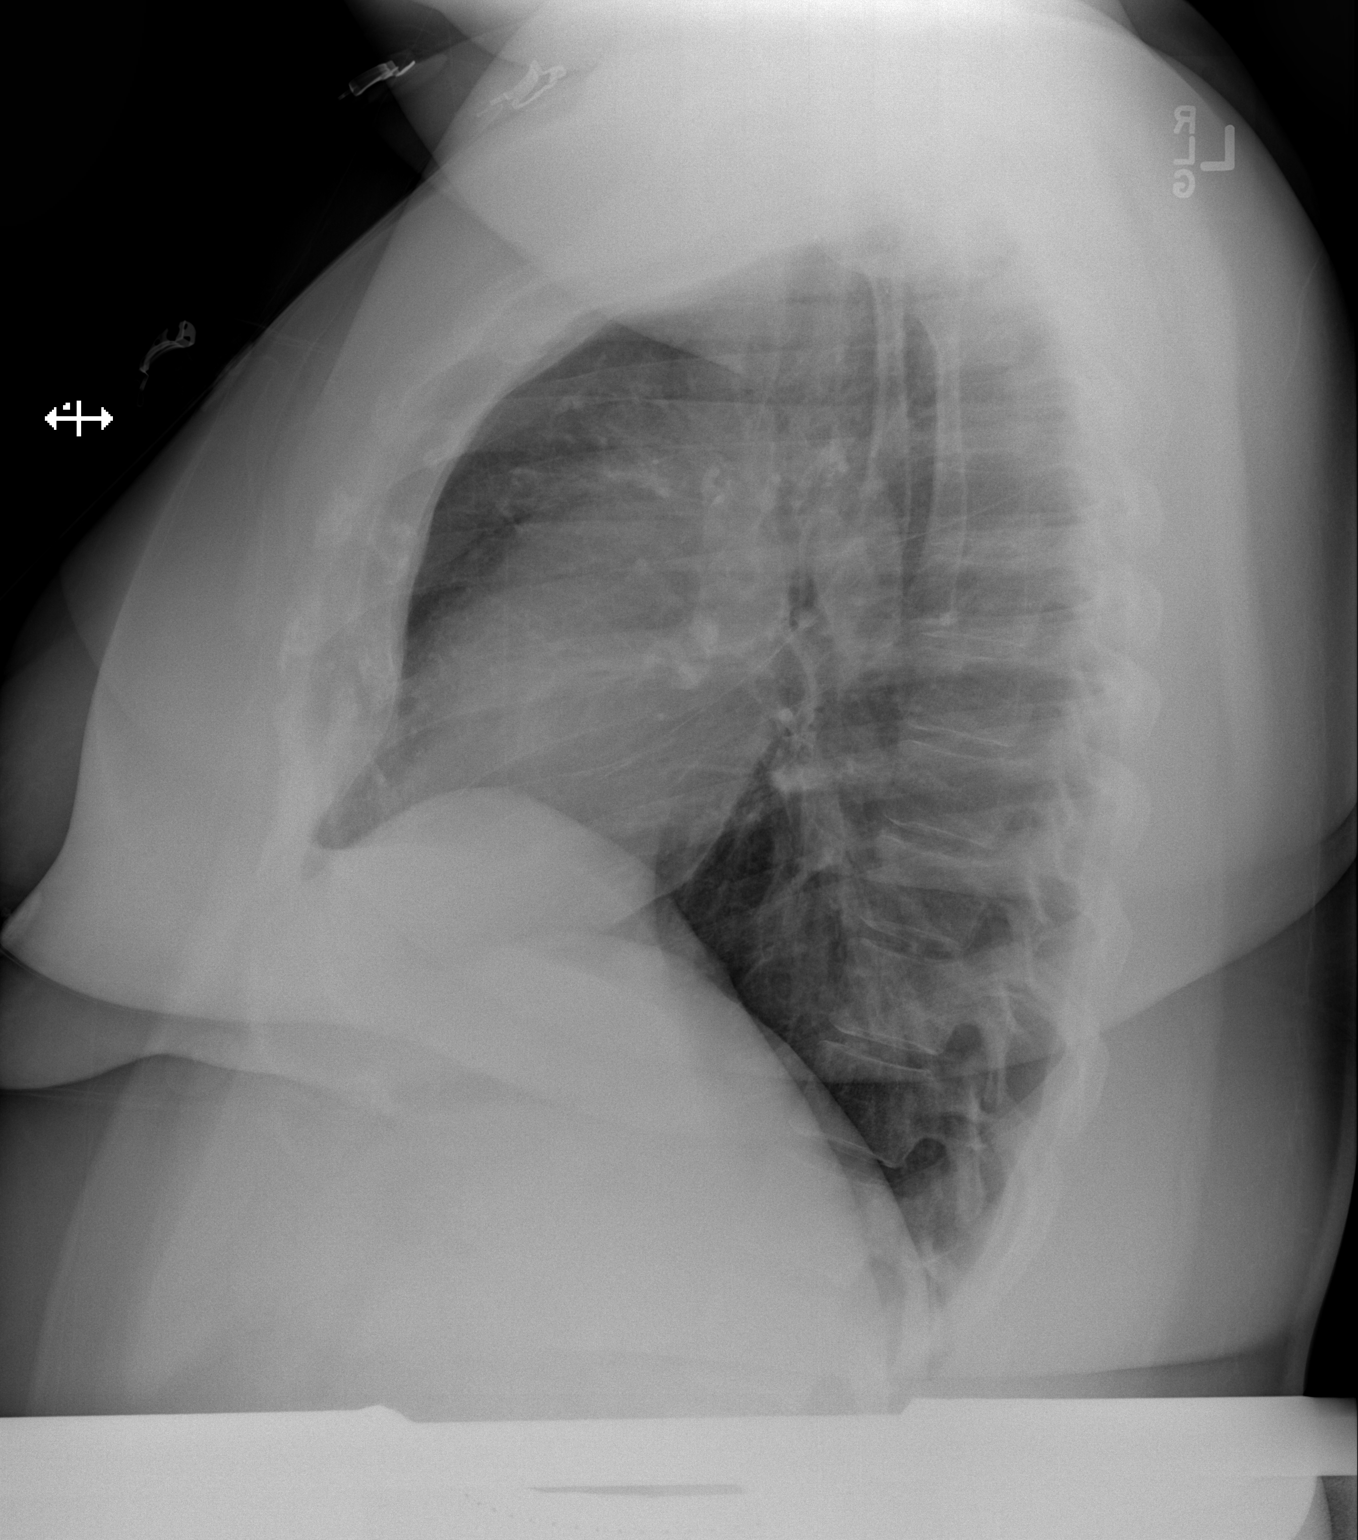

[2 of 2 positions shown; findings below may reference images not displayed]

FINDINGS: Lungs are clear. Heart size and pulmonary vascularity are normal. No
adenopathy. No pneumothorax. No bone lesions.
IMPRESSION: No edema or consolidation.

## 2018-09-21 ENCOUNTER — Encounter (HOSPITAL_COMMUNITY): Payer: Self-pay | Admitting: Emergency Medicine

## 2018-09-21 ENCOUNTER — Emergency Department (HOSPITAL_COMMUNITY): Payer: BLUE CROSS/BLUE SHIELD

## 2018-09-21 ENCOUNTER — Other Ambulatory Visit: Payer: Self-pay

## 2018-09-21 ENCOUNTER — Emergency Department (HOSPITAL_COMMUNITY)
Admission: EM | Admit: 2018-09-21 | Discharge: 2018-09-21 | Disposition: A | Discharge status: Home / Self Care | Payer: BLUE CROSS/BLUE SHIELD | Attending: Emergency Medicine | Admitting: Emergency Medicine

## 2018-09-21 DIAGNOSIS — F329 Major depressive disorder, single episode, unspecified: Secondary | ICD-10-CM | POA: Diagnosis not present

## 2018-09-21 DIAGNOSIS — N83202 Unspecified ovarian cyst, left side: Secondary | ICD-10-CM | POA: Diagnosis not present

## 2018-09-21 DIAGNOSIS — N83209 Unspecified ovarian cyst, unspecified side: Secondary | ICD-10-CM

## 2018-09-21 DIAGNOSIS — Z91040 Latex allergy status: Secondary | ICD-10-CM | POA: Diagnosis not present

## 2018-09-21 DIAGNOSIS — J45909 Unspecified asthma, uncomplicated: Secondary | ICD-10-CM | POA: Insufficient documentation

## 2018-09-21 DIAGNOSIS — N83201 Unspecified ovarian cyst, right side: Secondary | ICD-10-CM | POA: Insufficient documentation

## 2018-09-21 DIAGNOSIS — R1030 Lower abdominal pain, unspecified: Secondary | ICD-10-CM | POA: Diagnosis present

## 2018-09-21 DIAGNOSIS — Z79899 Other long term (current) drug therapy: Secondary | ICD-10-CM | POA: Insufficient documentation

## 2018-09-21 DIAGNOSIS — Z87891 Personal history of nicotine dependence: Secondary | ICD-10-CM | POA: Diagnosis not present

## 2018-09-21 DIAGNOSIS — R102 Pelvic and perineal pain: Secondary | ICD-10-CM | POA: Diagnosis not present

## 2018-09-21 LAB — CBC
HCT: 40.6 % (ref 36.0–46.0)
HEMOGLOBIN: 12.9 g/dL (ref 12.0–15.0)
MCH: 26.8 pg (ref 26.0–34.0)
MCHC: 31.8 g/dL (ref 30.0–36.0)
MCV: 84.4 fL (ref 80.0–100.0)
NRBC: 0 % (ref 0.0–0.2)
PLATELETS: 341 10*3/uL (ref 150–400)
RBC: 4.81 MIL/uL (ref 3.87–5.11)
RDW: 15 % (ref 11.5–15.5)
WBC: 8.8 10*3/uL (ref 4.0–10.5)

## 2018-09-21 LAB — URINALYSIS, ROUTINE W REFLEX MICROSCOPIC
Bilirubin Urine: NEGATIVE
GLUCOSE, UA: NEGATIVE mg/dL
HGB URINE DIPSTICK: NEGATIVE
Ketones, ur: NEGATIVE mg/dL
Leukocytes,Ua: NEGATIVE
Nitrite: NEGATIVE
PROTEIN: NEGATIVE mg/dL
SPECIFIC GRAVITY, URINE: 1.024 (ref 1.005–1.030)
pH: 5 (ref 5.0–8.0)

## 2018-09-21 LAB — COMPREHENSIVE METABOLIC PANEL
ALBUMIN: 3.5 g/dL (ref 3.5–5.0)
ALT: 14 U/L (ref 0–44)
ANION GAP: 9 (ref 5–15)
AST: 11 U/L — ABNORMAL LOW (ref 15–41)
Alkaline Phosphatase: 76 U/L (ref 38–126)
BUN: 12 mg/dL (ref 6–20)
CALCIUM: 8.7 mg/dL — AB (ref 8.9–10.3)
CHLORIDE: 107 mmol/L (ref 98–111)
CO2: 21 mmol/L — AB (ref 22–32)
Creatinine, Ser: 0.63 mg/dL (ref 0.44–1.00)
GFR calc Af Amer: >60 mL/min (ref >60)
GFR calc non Af Amer: >60 mL/min (ref >60)
GLUCOSE: 128 mg/dL — AB (ref 70–99)
POTASSIUM: 3.8 mmol/L (ref 3.5–5.1)
SODIUM: 137 mmol/L (ref 135–145)
Total Bilirubin: 0.2 mg/dL — ABNORMAL LOW (ref 0.3–1.2)
Total Protein: 7.7 g/dL (ref 6.5–8.1)

## 2018-09-21 LAB — HCG, QUANTITATIVE, PREGNANCY: HCG, BETA CHAIN, QUANT, S: 111 m[IU]/mL — AB (ref <5)

## 2018-09-21 LAB — WET PREP, GENITAL
Sperm: NONE SEEN
Trich, Wet Prep: NONE SEEN

## 2018-09-21 LAB — LIPASE, BLOOD: LIPASE: 33 U/L (ref 11–51)

## 2018-09-21 LAB — I-STAT BETA HCG BLOOD, ED (MC, WL, AP ONLY): HCG, QUANTITATIVE: 107.3 m[IU]/mL — AB (ref <5)

## 2018-09-21 MED ORDER — SODIUM CHLORIDE 0.9 % IV BOLUS
1000.0000 mL | Freq: Once | INTRAVENOUS | Status: AC
  Administered 2018-09-21: 1000 mL via INTRAVENOUS

## 2018-09-21 MED ORDER — ONDANSETRON HCL 4 MG/2ML IJ SOLN
4.0000 mg | Freq: Once | INTRAMUSCULAR | Status: AC
  Administered 2018-09-21: 4 mg via INTRAVENOUS

## 2018-09-21 MED ORDER — ACETAMINOPHEN 500 MG PO TABS
ORAL_TABLET | ORAL | Status: AC
  Administered 2018-09-21: 1000 mg
  Filled 2018-09-21: qty 2

## 2018-09-21 MED ORDER — KETOROLAC TROMETHAMINE 30 MG/ML IJ SOLN
30.0000 mg | Freq: Once | INTRAMUSCULAR | Status: AC
  Administered 2018-09-21: 30 mg via INTRAVENOUS
  Filled 2018-09-21: qty 1

## 2018-09-21 MED ORDER — ONDANSETRON HCL 4 MG/2ML IJ SOLN
4.0000 mg | Freq: Once | INTRAMUSCULAR | Status: AC
  Administered 2018-09-21: 4 mg via INTRAVENOUS
  Filled 2018-09-21: qty 2

## 2018-09-21 MED ORDER — SODIUM CHLORIDE 0.9% FLUSH: 3.0000 mL | Freq: Once | INTRAVENOUS | Status: DC

## 2018-09-21 MED ORDER — IBUPROFEN 600 MG PO TABS: 600.0000 mg | ORAL_TABLET | Freq: Four times a day (QID) | ORAL | 0 refills | Status: DC | PRN

## 2018-09-21 MED ORDER — MORPHINE SULFATE (PF) 4 MG/ML IV SOLN
4.0000 mg | Freq: Once | INTRAVENOUS | Status: DC
  Filled 2018-09-21: qty 1

## 2018-09-21 MED ORDER — ONDANSETRON 4 MG PO TBDP: 4.0000 mg | ORAL_TABLET | Freq: Three times a day (TID) | ORAL | 0 refills | Status: DC | PRN

## 2018-09-21 NOTE — ED Provider Notes (Signed)
[facility]-EMERGENCY DEPT Provider Note   CSN: [id number] Arrival date & time: 09/21/18  0053    History   Chief Complaint Chief Complaint  Patient presents with  . Abdominal Pain  . Flank Pain    HPI Shannon Lopez is a 28 y.o. female.     28 year old G36P0 female with hx of PCOS presents to the emergency department for evaluation of lower abdominal pain.  She reports constant, waxing and waning lower abdominal and pelvic pain x1 week.  Pain is worsened over the past 1 to 2 days with radiation to her left flank.  She states that she has worsening pain with movement and ambulation.  She has had some persistent nausea today without vomiting in the past 24 hours.  Has noted some unusual vaginal discharge.  No dysuria, hematuria, fevers, melena, hematochezia, diarrhea.  She reports previous home pregnancy test approximately 2.5 weeks ago.  She was prescribed misoprostol in 2018 which she never used.  Decided to take this medication when she realized she was pregnant to promote an abortion.  She had some vaginal bleeding for 24 hours with abdominal cramping which had subsided before lower abdominal discomfort returned 1 week ago.  No history of abdominal surgeries.  She is not actively followed by OB/GYN.  The history is provided by the patient. No language interpreter was used.  Abdominal Pain  Flank Pain  Associated symptoms include abdominal pain.    Past Medical History:  Diagnosis Date  . Allergy   . Asthma   . Depression 06/18/2015  . Morbid obesity with BMI of 40.0-44.9, adult [facility])     Patient Active Problem List   Diagnosis Date Noted  . Polycystic ovaries 02/18/2017  . Asthma 05/20/2014    History reviewed. No pertinent surgical history.   OB History    Gravida  2   Para      Term  0   Preterm  0   AB  1   Living  0     SAB      TAB      Ectopic      Multiple      Live Births               Home Medications    Prior  to Admission medications   Medication Sig Start Date End Date Taking? Authorizing Provider  acetaminophen (TYLENOL) 500 MG tablet Take 500 mg by mouth every 6 (six) hours as needed for mild pain or headache.   Yes [provider]  albuterol (PROVENTIL HFA;VENTOLIN HFA) 108 (90 Base) MCG/ACT inhaler Inhale 2 puffs into the lungs every 4 (four) hours as needed for wheezing or shortness of breath. 01/04/17 09/21/18 Yes Loletta Specter, PA-C  beclomethasone (QVAR) 80 MCG/ACT inhaler Inhale 2 puffs into the lungs daily.   Yes [provider]  montelukast (SINGULAIR) 10 MG tablet Take 1 tablet (10 mg total) by mouth at bedtime. 04/19/15  Yes Jeffery, Chelle, PA  ibuprofen (ADVIL,MOTRIN) 600 MG tablet Take 1 tablet (600 mg total) by mouth every 6 (six) hours as needed for mild pain or moderate pain. 09/21/18   Antony Madura, PA-C  metFORMIN (GLUCOPHAGE) 500 MG tablet Take 1 tablet (500 mg total) by mouth 3 (three) times daily as needed. Patient not taking: Reported on 03/11/2018 01/14/17   Loletta Specter, PA-C  ondansetron [facility] ODT) 4 MG disintegrating tablet Take 1 tablet (4 mg total) by mouth every 8 (eight) hours as needed  for nausea or vomiting. 09/21/18   Antony MaduraHumes, Jaide Hillenburg, PA-C    Family History Family History  Problem Relation Age of Onset  . Hyperlipidemia Father   . Heart disease Father   . Diabetes Father   . Irritable bowel syndrome Father   . Diabetes Maternal Grandmother        both sides of the family  . Irritable bowel syndrome Sister     Social History Social History   Tobacco Use  . Smoking status: Former Smoker    Types: Cigarettes  . Smokeless tobacco: Never Used  Substance Use Topics  . Alcohol use: No    Alcohol/week: 0.0 standard drinks  . Drug use: No     Allergies   Latex   Review of Systems Review of Systems  Gastrointestinal: Positive for abdominal pain.  Genitourinary: Positive for flank pain.  Ten systems reviewed and are negative for  acute change, except as noted in the HPI.    Physical Exam Updated Vital Signs BP 101/74 (BP Location: Right Arm)   Pulse 73   Temp 98.3 F (36.8 C) (Oral)   Resp 16   Ht 5' 5.5" (1.664 m)   Wt 117.9 kg   SpO2 100%   Breastfeeding Unknown   BMI 42.61 kg/m   Physical Exam Vitals signs and nursing note reviewed.  Constitutional:      General: She is not in acute distress.    Appearance: She is well-developed. She is not diaphoretic.     Comments: Obese female. Seems uncomfortable.  HENT:     Head: Normocephalic and atraumatic.  Eyes:     General: No scleral icterus.    Conjunctiva/sclera: Conjunctivae normal.  Neck:     Musculoskeletal: Normal range of motion.  Cardiovascular:     Rate and Rhythm: Normal rate and regular rhythm.     Pulses: Normal pulses.  Pulmonary:     Effort: Pulmonary effort is normal. No respiratory distress.     Breath sounds: No stridor. No wheezing.     Comments: Respirations even and unlabored Abdominal:     Palpations: Abdomen is soft.     Comments: TTP in RLQ and LLQ in addition to suprapubic abdomen. No peritoneal signs or palpable masses. Exam is limited 2/2 habitus.  Musculoskeletal: Normal range of motion.  Skin:    General: Skin is warm and dry.     Coloration: Skin is not pale.     Findings: No erythema or rash.  Neurological:     Mental Status: She is alert and oriented to person, place, and time.     Comments: Ambulatory with steady gait.  Psychiatric:        Behavior: Behavior normal.      ED Treatments / Results  Labs (all labs ordered are listed, but only abnormal results are displayed) Labs Reviewed  WET PREP, GENITAL - Abnormal; Notable for the following components:      Result Value   Yeast Wet Prep HPF POC PRESENT (*)    Clue Cells Wet Prep HPF POC PRESENT (*)    WBC, Wet Prep HPF POC MANY (*)    All other components within normal limits  COMPREHENSIVE METABOLIC PANEL - Abnormal; Notable for the following  components:   CO2 21 (*)    Glucose, Bld 128 (*)    Calcium 8.7 (*)    AST 11 (*)    Total Bilirubin 0.2 (*)    All other components within normal limits  HCG, QUANTITATIVE, PREGNANCY -  Abnormal; Notable for the following components:   hCG, Beta Chain, Quant, S 111 (*)    All other components within normal limits  I-STAT BETA HCG BLOOD, ED ([facility], [facility], [facility] ONLY) - Abnormal; Notable for the following components:   I-stat hCG, quantitative 107.3 (*)    All other components within normal limits  URINE CULTURE  LIPASE, BLOOD  CBC  URINALYSIS, ROUTINE W REFLEX MICROSCOPIC  GC/CHLAMYDIA PROBE AMP ([place]) NOT AT [facility]    EKG None  Radiology US Pelvic Complete With Transvaginal  Result Date: 09/21/2018 CLINICAL DATA:  Pelvic pain EXAM: TRANSABDOMINAL AND TRANSVAGINAL ULTRASOUND OF PELVIS DOPPLER ULTRASOUND OF OVARIES TECHNIQUE: Both transabdominal and transvaginal ultrasound examinations of the pelvis were performed. Transabdominal technique was performed for global imaging of the pelvis including uterus, ovaries, adnexal regions, and pelvic cul-de-sac. It was necessary to proceed with endovaginal exam following the transabdominal exam to visualize the ovaries. Color and duplex Doppler ultrasound was utilized to evaluate blood flow to the ovaries. COMPARISON:  03/11/2018 FINDINGS: Uterus Measurements: 11 x 4 x 6 cm.  No fibroids or other mass visualized. Endometrium Thickness: 9 mm.  No focal abnormality visualized. Right ovary Measurements: 39 x 27 x 20 mm . 21 mm avascular mass with internal septations. Left ovary Measurements: 50 x 42 x 45 mm . 4.2 cm intra-ovarian mass with mid level echoes and occasional lace-like appearance, avascular. Pulsed Doppler evaluation of both ovaries demonstrates normal low-resistance arterial and venous waveforms. Other findings No abnormal free fluid. IMPRESSION: 1. Bilateral complex cystic ovarian masses not seen on pelvic ultrasound August 2019, favor hemorrhagic  cysts. Recommend follow-up pelvic ultrasound in 6-12 weeks. 2. Normal ovarian blood flow. Electronically Signed   By: Marnee Spring M.D.   On: 09/21/2018 06:03   US Pelvic Complete W Transvaginal And Torsion R/o  Result Date: 09/21/2018 CLINICAL DATA:  Pelvic pain EXAM: TRANSABDOMINAL AND TRANSVAGINAL ULTRASOUND OF PELVIS DOPPLER ULTRASOUND OF OVARIES TECHNIQUE: Both transabdominal and transvaginal ultrasound examinations of the pelvis were performed. Transabdominal technique was performed for global imaging of the pelvis including uterus, ovaries, adnexal regions, and pelvic cul-de-sac. It was necessary to proceed with endovaginal exam following the transabdominal exam to visualize the ovaries. Color and duplex Doppler ultrasound was utilized to evaluate blood flow to the ovaries. COMPARISON:  03/11/2018 FINDINGS: Uterus Measurements: 11 x 4 x 6 cm.  No fibroids or other mass visualized. Endometrium Thickness: 9 mm.  No focal abnormality visualized. Right ovary Measurements: 39 x 27 x 20 mm . 21 mm avascular mass with internal septations. Left ovary Measurements: 50 x 42 x 45 mm . 4.2 cm intra-ovarian mass with mid level echoes and occasional lace-like appearance, avascular. Pulsed Doppler evaluation of both ovaries demonstrates normal low-resistance arterial and venous waveforms. Other findings No abnormal free fluid. IMPRESSION: 1. Bilateral complex cystic ovarian masses not seen on pelvic ultrasound August 2019, favor hemorrhagic cysts. Recommend follow-up pelvic ultrasound in 6-12 weeks. 2. Normal ovarian blood flow. Electronically Signed   By: Marnee Spring M.D.   On: 09/21/2018 06:03    Procedures Procedures (including critical care time)  Medications Ordered in ED Medications  sodium chloride 0.9 % bolus 1,000 mL (0 mLs Intravenous Stopped 09/21/18 0639)  ondansetron (ZOFRAN) injection 4 mg (4 mg Intravenous Given 09/21/18 0455)  acetaminophen (TYLENOL) 500 MG tablet (1,000 mg  Given 09/21/18  0455)  ketorolac (TORADOL) 30 MG/ML injection 30 mg (30 mg Intravenous Given 09/21/18 0645)  ondansetron (ZOFRAN) injection 4 mg (4 mg Intravenous  Given 09/21/18 0645)     6:31 AM Spoke with Dr. Adrian Blackwater of OB/GYN.  He has reviewed the patient's ultrasound and is aware of the patient's positive pregnancy test with hCG of 111.  There is no evidence of retained products of conception from recent misoprostol use.  He recommends trending of hCG in 48 hours.  States this can be completed at the [facility].   Initial Impression / Assessment and Plan / ED Course  I have reviewed the triage vital signs and the nursing notes.  Pertinent labs & imaging results that were available during my care of the patient were reviewed by me and considered in my medical decision making (see chart for details).        28 year old female presents to the emergency department for evaluation of lower abdominal pain.  She has been experiencing pain for the past week.  Reports using misoprostol 2.5 weeks ago to terminate a pregnancy.  She states that she had this medication left over from 2018.  Patient with hCG of 111.  She underwent ultrasound to exclude retained products of conception.  Her ultrasound is suggestive of bilateral hemorrhagic ovarian cysts.  This is suspected to be the cause of patient's symptoms.  Laboratory evaluation has been largely reassuring without leukocytosis, anemia, electrolyte derangements.  Liver and kidney function preserved.  Urinalysis does not suggest infection.  The patient has declined all narcotics in the emergency department.  Pain has been managed with Tylenol and Toradol.  Her pain has improved with these medications.  She has been hemodynamically stable since arrival.  I feel it is appropriate for her to follow-up with OB/GYN in 48 hours for repeat hCG testing.  Have counseled on the use of ibuprofen for symptomatic control.  Return precautions discussed and provided.  Patient discharged in stable condition with no unaddressed concerns.   Final Clinical Impressions(s) / ED Diagnoses   Final diagnoses:  Pelvic pain in female  Hemorrhagic ovarian cyst    ED Discharge Orders         Ordered    ondansetron (ZOFRAN ODT) 4 MG disintegrating tablet  Every 8 hours PRN     09/21/18 0645    ibuprofen (ADVIL,MOTRIN) 600 MG tablet  Every 6 hours PRN     09/21/18 0645           Antony Madura, PA-C 09/22/18 0529    Molpus, Jonny Ruiz, MD 09/22/18 [phone]

## 2018-09-21 NOTE — ED Triage Notes (Signed)
Patient complaining of lower abdominal pain and left side flank pain. Patient states that it has been going on for as week. Patient took abortion pills about a month ago.

## 2018-09-21 NOTE — Discharge Instructions (Signed)
Your ultrasound today showed bilateral ruptured ovarian cysts.  This is likely the cause of your abdominal discomfort.  Your blood tests and urine tests were normal.  We recommend ongoing pain control with ibuprofen.  You may take Zofran as needed for nausea.  You were found to have a positive pregnancy test today.  Given your recent use of misoprostol, you require repeat blood pregnancy testing in 48 hours.  Had this performed at the [facility] as listed above.  Call in the morning to make this appointment for Friday.  You may continue to follow-up with your primary care doctor as needed.

## 2018-09-21 NOTE — ED Notes (Signed)
Bed: WA05 Expected date:  Expected time:  Means of arrival:  Comments: 

## 2018-09-22 ENCOUNTER — Other Ambulatory Visit: Payer: Self-pay | Admitting: *Deleted

## 2018-09-22 DIAGNOSIS — O039 Complete or unspecified spontaneous abortion without complication: Secondary | ICD-10-CM

## 2018-09-22 LAB — GC/CHLAMYDIA PROBE AMP (~~LOC~~) NOT AT ARMC
CHLAMYDIA, DNA PROBE: NEGATIVE
Neisseria Gonorrhea: NEGATIVE

## 2018-09-22 LAB — URINE CULTURE

## 2018-09-23 ENCOUNTER — Other Ambulatory Visit: Payer: BLUE CROSS/BLUE SHIELD

## 2018-09-23 DIAGNOSIS — O039 Complete or unspecified spontaneous abortion without complication: Secondary | ICD-10-CM

## 2018-09-24 LAB — BETA HCG QUANT (REF LAB): hCG Quant: 392 m[IU]/mL

## 2018-09-26 ENCOUNTER — Telehealth: Payer: Self-pay | Admitting: *Deleted

## 2018-09-26 NOTE — Telephone Encounter (Signed)
-----   Message from Levie Heritage, DO sent at 09/26/2018  8:18 AM EDT ----- Sufficient rise in hcg. She has not miscarried. If she wishes to keep the pregnancy, then she will need an [place] in 2 weeks. If she wishes to terminate the pregnancy, then she will need to get care with [facility].

## 2018-09-26 NOTE — Telephone Encounter (Signed)
Called pt to inform her that she had a rise in hcg, indicating that she has not miscarried and that if she wishes to keep the pregnancy, she will need an [place] in 2 weeks.  Pt did not pick up.  Left message informing the pt that I was calling regarding results and requesting that she call the office to discuss these results.

## 2018-09-26 NOTE — Telephone Encounter (Signed)
Patient called back that she missed our call.

## 2018-09-27 NOTE — Telephone Encounter (Signed)
Called pt and notified her that pregnancy hormone level indicates that she is pregnant.  Pt expressed concern because she took medication that was expired from when she prescribed in the past to induce contractions and that it may cause abnormalities with the baby.  I explained to the pt that there is genetic testing that can be done and to not be concerned at this time.  I confirmed with pt what her plans were since the medication that she took was not successful.  Pt stated that she did not know.  I advised pt to go to [facility] so that she can get resources to help with her decision.  Pt stated thank you with no further questions.

## 2018-10-17 ENCOUNTER — Emergency Department (HOSPITAL_COMMUNITY): Payer: BLUE CROSS/BLUE SHIELD

## 2018-10-17 ENCOUNTER — Encounter (HOSPITAL_COMMUNITY): Payer: Self-pay

## 2018-10-17 ENCOUNTER — Other Ambulatory Visit: Payer: Self-pay

## 2018-10-17 ENCOUNTER — Emergency Department (HOSPITAL_COMMUNITY)
Admission: EM | Admit: 2018-10-17 | Discharge: 2018-10-18 | Disposition: A | Discharge status: Home / Self Care | Payer: BLUE CROSS/BLUE SHIELD | Attending: Emergency Medicine | Admitting: Emergency Medicine

## 2018-10-17 DIAGNOSIS — R0602 Shortness of breath: Secondary | ICD-10-CM | POA: Diagnosis not present

## 2018-10-17 DIAGNOSIS — Z87891 Personal history of nicotine dependence: Secondary | ICD-10-CM | POA: Insufficient documentation

## 2018-10-17 DIAGNOSIS — Z79899 Other long term (current) drug therapy: Secondary | ICD-10-CM | POA: Diagnosis not present

## 2018-10-17 DIAGNOSIS — J45909 Unspecified asthma, uncomplicated: Secondary | ICD-10-CM | POA: Insufficient documentation

## 2018-10-17 DIAGNOSIS — R079 Chest pain, unspecified: Secondary | ICD-10-CM

## 2018-10-17 LAB — BASIC METABOLIC PANEL
Anion gap: 9 (ref 5–15)
BUN: 11 mg/dL (ref 6–20)
CO2: 24 mmol/L (ref 22–32)
CREATININE: 0.77 mg/dL (ref 0.44–1.00)
Calcium: 9 mg/dL (ref 8.9–10.3)
Chloride: 104 mmol/L (ref 98–111)
GFR calc Af Amer: >60 mL/min (ref >60)
GFR calc non Af Amer: >60 mL/min (ref >60)
Glucose, Bld: 113 mg/dL — ABNORMAL HIGH (ref 70–99)
Potassium: 3.8 mmol/L (ref 3.5–5.1)
Sodium: 137 mmol/L (ref 135–145)

## 2018-10-17 LAB — CBC
HEMATOCRIT: 39.3 % (ref 36.0–46.0)
Hemoglobin: 12.8 g/dL (ref 12.0–15.0)
MCH: 27 pg (ref 26.0–34.0)
MCHC: 32.6 g/dL (ref 30.0–36.0)
MCV: 82.9 fL (ref 80.0–100.0)
Platelets: 318 10*3/uL (ref 150–400)
RBC: 4.74 MIL/uL (ref 3.87–5.11)
RDW: 14.6 % (ref 11.5–15.5)
WBC: 9.9 10*3/uL (ref 4.0–10.5)
nRBC: 0 % (ref 0.0–0.2)

## 2018-10-17 LAB — TROPONIN I: Troponin I: <0.03 ng/mL (ref <0.03)

## 2018-10-17 MED ORDER — SODIUM CHLORIDE 0.9% FLUSH
3.0000 mL | Freq: Once | INTRAVENOUS | Status: AC
  Administered 2018-10-17: 3 mL via INTRAVENOUS

## 2018-10-17 MED ORDER — ASPIRIN 325 MG PO TABS: 325.0000 mg | ORAL_TABLET | Freq: Every day | ORAL | Status: DC

## 2018-10-17 NOTE — ED Triage Notes (Signed)
Pt also complains of being short of breath this evening, she has a hx of asthma

## 2018-10-17 NOTE — ED Triage Notes (Signed)
Pt complains of left sided chest pain that radiates to her back and she has a burning sensation between her shoulder blades

## 2018-10-17 NOTE — ED Provider Notes (Signed)
[facility]-EMERGENCY DEPT Provider Note   CSN: [id number] Arrival date & time: 10/17/18  2141    History   Chief Complaint Chief Complaint  Patient presents with  . Chest Pain    HPI Shannon Lopez is a 28 y.o. female with PMHx asthma who presents to the ED complaining of sudden onset, intermittent, dull/achy, substernal chest pain radiating to left neck/left arm and through to back x 3 days. Pt reports the pain lasts about 15 minutes before subsiding on its own; has experienced 6-7 episodes today. The pain is worsened with laying supine and alleviated with sitting up. She also complains of shortness of breath; first pt was experiencing dyspnea on exertion but now she reports shortness of breath even at rest. Has never had symptoms like this before. No FHx of CAD although pt reports most of her family is in [place] so she would not know. No recent prolonged travel or immobilization. No estrogen use. Former occasional smoker. No hx DVT/PE, Denies fever, chills, cough, nausea, vomiting, diaphoresis, leg swelling, or any other associated symptoms.   Pt was previously seen in the ED on 03/04 for lower abdominal pain after having a positive home pregnancy test and taking previously prescribed misoprostol. Pt was still pregnant at time of ED visit with beta HCG of 111. Pt reports she followed up with [facility] where she had a rise in her HCG. She then followed up with [facility] as she wished to terminate the pregnancy. Reports she saw PP on 03/24 and was given misoprostol; she had expulsion of products the next day and then had a follow up appointment 3 days ago on 03/27 with an ultrasound that showed pregnancy was terminated successfully with no retained products of conception.     Past Medical History:  Diagnosis Date  . Allergy   . Asthma   . Depression 06/18/2015  . Morbid obesity with BMI of 40.0-44.9, adult [facility])     Patient Active Problem List   Diagnosis Date Noted  . Polycystic ovaries 02/18/2017  . Asthma 05/20/2014    History reviewed. No pertinent surgical history.   OB History    Gravida  2   Para      Term  0   Preterm  0   AB  1   Living  0     SAB      TAB      Ectopic      Multiple      Live Births               Home Medications    Prior to Admission medications   Medication Sig Start Date End Date Taking? Authorizing Provider  acetaminophen (TYLENOL) 500 MG tablet Take 500 mg by mouth every 6 (six) hours as needed for mild pain or headache.   Yes [provider]  albuterol (PROVENTIL HFA;VENTOLIN HFA) 108 (90 Base) MCG/ACT inhaler Inhale 2 puffs into the lungs every 4 (four) hours as needed for wheezing or shortness of breath. 01/04/17 10/18/18 Yes Loletta Specter, PA-C  beclomethasone (QVAR) 80 MCG/ACT inhaler Inhale 2 puffs into the lungs daily.   Yes [provider]  montelukast (SINGULAIR) 10 MG tablet Take 1 tablet (10 mg total) by mouth at bedtime. 04/19/15  Yes Porfirio Oar, PA  Multiple Vitamin (MULTIVITAMIN WITH MINERALS) TABS tablet Take 1 tablet by mouth daily.   Yes [provider]  ibuprofen (ADVIL,MOTRIN) 600 MG tablet Take 1 tablet (600 mg total)  by mouth every 6 (six) hours as needed for mild pain or moderate pain. Patient not taking: Reported on 10/18/2018 09/21/18   Antony Madura, PA-C  metFORMIN (GLUCOPHAGE) 500 MG tablet Take 1 tablet (500 mg total) by mouth 3 (three) times daily as needed. Patient not taking: Reported on 03/11/2018 01/14/17   Loletta Specter, PA-C  ondansetron [facility] ODT) 4 MG disintegrating tablet Take 1 tablet (4 mg total) by mouth every 8 (eight) hours as needed for nausea or vomiting. Patient not taking: Reported on 10/18/2018 09/21/18   Antony Madura, PA-C    Family History Family History  Problem Relation Age of Onset  . Hyperlipidemia Father   . Heart disease Father   . Diabetes Father   . Irritable bowel syndrome  Father   . Diabetes Maternal Grandmother        both sides of the family  . Irritable bowel syndrome Sister     Social History Social History   Tobacco Use  . Smoking status: Former Smoker    Types: Cigarettes    Last attempt to quit: 2015    Years since quitting: 5.2  . Smokeless tobacco: Never Used  Substance Use Topics  . Alcohol use: No    Alcohol/week: 0.0 standard drinks  . Drug use: No     Allergies   Latex   Review of Systems Review of Systems  Constitutional: Negative for chills and fever.  HENT: Negative for ear pain and sore throat.   Eyes: Negative for visual disturbance.  Respiratory: Positive for chest tightness and shortness of breath. Negative for cough.   Cardiovascular: Positive for chest pain. Negative for leg swelling.  Gastrointestinal: Negative for abdominal pain, nausea and vomiting.  Genitourinary: Negative for dysuria, frequency, pelvic pain and vaginal bleeding.  Musculoskeletal: Positive for back pain. Negative for neck pain.  Skin: Negative for rash.  Neurological: Negative for syncope.     Physical Exam Updated Vital Signs BP (!) 132/111 (BP Location: Left Arm)   Pulse 74   Temp 98.1 F (36.7 C) (Oral)   Resp 16   LMP 08/17/2018   SpO2 100%   Physical Exam Vitals signs and nursing note reviewed.  Constitutional:      Appearance: She is obese. She is not ill-appearing.  HENT:     Head: Normocephalic and atraumatic.  Eyes:     Extraocular Movements: Extraocular movements intact.     Conjunctiva/sclera: Conjunctivae normal.     Pupils: Pupils are equal, round, and reactive to light.  Neck:     Musculoskeletal: Normal range of motion and neck supple.  Cardiovascular:     Rate and Rhythm: Normal rate and regular rhythm.     Pulses:          Radial pulses are 2+ on the right side and 2+ on the left side.       Dorsalis pedis pulses are 2+ on the right side and 2+ on the left side.     Heart sounds: Normal heart sounds.   Pulmonary:     Effort: Pulmonary effort is normal.     Breath sounds: Normal breath sounds. No decreased breath sounds, wheezing, rhonchi or rales.  Chest:     Chest wall: Tenderness present.     Comments: Tenderness to palpation along sternum Abdominal:     Palpations: Abdomen is soft.     Tenderness: There is no abdominal tenderness. There is no right CVA tenderness or left CVA tenderness.  Musculoskeletal:  Right lower leg: No edema.     Left lower leg: No edema.     Comments: Tenderness to left thoracic paraspinal muscles. No C, T, or L spine midline tenderness.   Skin:    General: Skin is warm and dry.  Neurological:     General: No focal deficit present.     Mental Status: She is alert and oriented to person, place, and time.     Cranial Nerves: No cranial nerve deficit.     Motor: No weakness.      ED Treatments / Results  Labs (all labs ordered are listed, but only abnormal results are displayed) Labs Reviewed  BASIC METABOLIC PANEL - Abnormal; Notable for the following components:      Result Value   Glucose, Bld 113 (*)    All other components within normal limits  HCG, QUANTITATIVE, PREGNANCY - Abnormal; Notable for the following components:   hCG, Beta Chain, Quant, S 3,539 (*)    All other components within normal limits  CBC  TROPONIN I  TROPONIN I    EKG None  Radiology Dg Chest 2 View  Result Date: 10/17/2018 CLINICAL DATA:  Chest pain EXAM: CHEST - 2 VIEW COMPARISON:  10/12/2016 FINDINGS: The heart size and mediastinal contours are within normal limits. Both lungs are clear. The visualized skeletal structures are unremarkable. IMPRESSION: No active cardiopulmonary disease. Electronically Signed   By: Deatra RobinsonKevin  Herman M.D.   On: 10/17/2018 22:11   CT A Chest IMPRESSION:  Very limited evaluation of the pulmonary arteries due to respiratory  motion artifact and timing of the contrast. No definite large  central pulmonary artery embolus identified. If  there is high  clinical concern for acute PE further evaluation with V/Q scan  recommended.      Procedures Procedures (including critical care time)  Medications Ordered in ED Medications  sodium chloride flush (NS) 0.9 % injection 3 mL (3 mLs Intravenous Given 10/17/18 2259)  iohexol (OMNIPAQUE) 350 MG/ML injection 100 mL (100 mLs Intravenous Contrast Given 10/18/18 0116)  ketorolac (TORADOL) 15 MG/ML injection 15 mg (15 mg Intravenous Given 10/18/18 0157)     Initial Impression / Assessment and Plan / ED Course  I have reviewed the triage vital signs and the nursing notes.  Pertinent labs & imaging results that were available during my care of the patient were reviewed by me and considered in my medical decision making (see chart for details).  Clinical Course as of Oct 18 350  Tue Oct 18, 2018  0040 Discussed with Dr. Bradly ChrisStroud, radiology, who is ok proceeding with CT angio.  Pt will sign release for the test.   [HM]    Clinical Course User Index [HM] Muthersbaugh, Dahlia ClientHannah, PA-C   Substernal chest pain radiating to left arm and mid back with SOB. Worse with lying supine. Satting 100% on RA. Able to reproduce pain with palpation. No fever or cough. DDx includes ACS, PE, PTX, dissection, myocarditis, pericarditis, musculoskeletal pain. Risk factors for ACS include obesity and occasional former smoker. Recently had medical abortion 1 week ago; [facility]. HCG quant ordered to confirm. No significant FHx. PERC negative; no D dimer ordered. Vital signs stable; no hypotension; pt afebrile. Will get ACS rule out with EKG, troponin, and CXR.   CXR negative, no PTX. Troponin negative as well with unremarkable EKG. Given pt's recent abortion concern for PE at this time. HCG quant elevated at 3,000; unsure if trending down as cannot see PP  notes. Had discussion with pt about concern for PE given presentation as well as need for CTA chest at this time for rule out. Pt is  willing to sign release form for test; she understands the risks vs benefits. Dahlia Client Muthersbaugh, PA-C discussed case with radiology (see ED course) who is okay with proceeding with CTA.   CTA Chest with limited eval of pulmonary arteries but no definitive large central PE identified. Radiology suggests VQ scan if high clinical concern. Given pt's PERC is negative as well as Well's Criteria with low risk do not feel pt needs VQ scan at this time. Discussed case with attending physician Dr. Read Drivers who evaluated pt and agrees pt does not need further imaging at this time; suggests 15 mg Toradol to see if this alleviates pain prior to discharge.   Still having mild pain although improved with Toradol. Pt is very anxious and concerned about her pain. Do not suspect elevation in delta trop although will get one to reassure pt prior to discharge and follow up with PCP.   Repeat troponin negative. Will discharge patient home at this time with follow up with PCP. Pt is in agreement with plan at this time and vital signs stable.   HEAR Score: 1    Final Clinical Impressions(s) / ED Diagnoses   Final diagnoses:  Nonspecific chest pain  Shortness of breath    ED Discharge Orders    None       Tanda Rockers, PA-C 10/18/18 0414    Benjiman Core, MD 10/19/18 [phone]

## 2018-10-18 ENCOUNTER — Emergency Department (HOSPITAL_COMMUNITY): Payer: BLUE CROSS/BLUE SHIELD

## 2018-10-18 ENCOUNTER — Encounter (HOSPITAL_COMMUNITY): Payer: Self-pay

## 2018-10-18 LAB — HCG, QUANTITATIVE, PREGNANCY: hCG, Beta Chain, Quant, S: 3539 m[IU]/mL — ABNORMAL HIGH (ref <5)

## 2018-10-18 LAB — TROPONIN I: Troponin I: <0.03 ng/mL (ref <0.03)

## 2018-10-18 MED ORDER — SODIUM CHLORIDE (PF) 0.9 % IJ SOLN
INTRAMUSCULAR | Status: AC
  Filled 2018-10-18: qty 50

## 2018-10-18 MED ORDER — IOHEXOL 350 MG/ML SOLN
100.0000 mL | Freq: Once | INTRAVENOUS | Status: AC | PRN
  Administered 2018-10-18: 100 mL via INTRAVENOUS

## 2018-10-18 MED ORDER — KETOROLAC TROMETHAMINE 15 MG/ML IJ SOLN
15.0000 mg | Freq: Once | INTRAMUSCULAR | Status: AC
  Administered 2018-10-18: 15 mg via INTRAVENOUS
  Filled 2018-10-18: qty 1

## 2018-10-18 NOTE — Discharge Instructions (Addendum)
You were seen in the ED today for chest pain and shortness of breath. Your workup was essentially negative including repeat cardiac enzymes. You had a CT scan of your chest as well which did not show any blood clots in your lungs. You can take Tylenol and Ibuprofen as needed for pain. Please follow up with your PCP in the morning regarding your ED visit. Return to the ED for any worsening pain, difficulty breathing, fever, vomiting blood.

## 2018-11-14 ENCOUNTER — Telehealth: Payer: BLUE CROSS/BLUE SHIELD | Admitting: Physician Assistant

## 2018-11-14 DIAGNOSIS — R059 Cough, unspecified: Secondary | ICD-10-CM

## 2018-11-14 DIAGNOSIS — R05 Cough: Secondary | ICD-10-CM

## 2018-11-14 MED ORDER — PROMETHAZINE-DM 6.25-15 MG/5ML PO SYRP: 5.0000 mL | ORAL_SOLUTION | Freq: Four times a day (QID) | ORAL | 0 refills | Status: DC | PRN

## 2018-11-14 MED ORDER — BENZONATATE 100 MG PO CAPS: 100.0000 mg | ORAL_CAPSULE | Freq: Three times a day (TID) | ORAL | 0 refills | Status: DC

## 2018-11-14 NOTE — Progress Notes (Signed)
E-Visit for Corona Virus Screening  Based on your current symptoms, you may very well have the virus, however your symptoms are mild. Currently, not all patients are being tested. If the symptoms are mild and there is not a known exposure, performing the test is not indicated.  Coronavirus disease 2019 (COVID-19) is a respiratory illness that can spread from person to person. The virus that causes COVID-19 is a new virus that was first identified in the country of [place] but is now found in multiple other countries and has spread to the [place].  Symptoms associated with the virus are mild to severe fever, cough, and shortness of breath. There is currently no vaccine to protect against COVID-19, and there is no specific antiviral treatment for the virus.   To be considered HIGH RISK for Coronavirus (COVID-19), you have to meet the following criteria:  . Traveled to [place], [place], [place], [place] or [place]; or in the [place] to [place], [place], [place], or [place]; and have fever, cough, and shortness of breath within the last 2 weeks of travel OR  . Been in close contact with a person diagnosed with COVID-19 within the last 2 weeks and have fever, cough, and shortness of breath  . IF YOU DO NOT MEET THESE CRITERIA, YOU ARE CONSIDERED LOW RISK FOR COVID-19.   It is vitally important that if you feel that you have an infection such as this virus or any other virus that you stay home and away from places where you may spread it to others.  You should self-quarantine for 14 days if you have symptoms that could potentially be coronavirus and avoid contact with people age 69 and older.   You can use medication such as A prescription cough medication called Tessalon Perles 100 mg. You may take 1-2 capsules every 8 hours as needed for cough and A prescription cough medication called Phenergan DM 6.25 mg/15 mg. You make take one teaspoon / 5 ml every 4-6 hours as needed for cough  You  may also take acetaminophen (Tylenol) as needed for fever.   Reduce your risk of any infection by using the same precautions used for avoiding the common cold or flu:  Marland Kitchen Wash your hands often with soap and warm water for at least 20 seconds.  If soap and water are not readily available, use an alcohol-based hand sanitizer with at least 60% alcohol.  . If coughing or sneezing, cover your mouth and nose by coughing or sneezing into the elbow areas of your shirt or coat, into a tissue or into your sleeve (not your hands). . Avoid shaking hands with others and consider head nods or verbal greetings only. . Avoid touching your eyes, nose, or mouth with unwashed hands.  . Avoid close contact with people who are sick. . Avoid places or events with large numbers of people in one location, like concerts or sporting events. . Carefully consider travel plans you have or are making. . If you are planning any travel outside or inside the [place], visit the CDC's Travelers' Health webpage for the latest health notices. . If you have some symptoms but not all symptoms, continue to monitor at home and seek medical attention if your symptoms worsen. . If you are having a medical emergency, call 911.  HOME CARE . Only take medications as instructed by your medical team. . Drink plenty of fluids and get plenty of rest. . A steam or ultrasonic humidifier can  help if you have congestion.   GET HELP RIGHT AWAY IF: . You develop worsening fever. . You become short of breath . You cough up blood. . Your symptoms become more severe MAKE SURE YOU   Understand these instructions.  Will watch your condition.  Will get help right away if you are not doing well or get worse.  Your e-visit answers were reviewed by a board certified advanced clinical practitioner to complete your personal care plan.  Depending on the condition, your plan could have included both over the counter or prescription medications.  If there is  a problem please reply once you have received a response from your provider. Your safety is important to [place].  If you have drug allergies check your prescription carefully.    You can use MyChart to ask questions about today's visit, request a non-urgent call back, or ask for a work or school excuse for 24 hours related to this e-Visit. If it has been greater than 24 hours you will need to follow up with your provider, or enter a new e-Visit to address those concerns. You will get an e-mail in the next two days asking about your experience.  I hope that your e-visit has been valuable and will speed your recovery. Thank you for using e-visits.  Approximately 5 minutes was spent documenting and reviewing patient's chart.   Jodell Cipro, PA-C

## 2019-01-05 ENCOUNTER — Ambulatory Visit (INDEPENDENT_AMBULATORY_CARE_PROVIDER_SITE_OTHER)
Admission: RE | Admit: 2019-01-05 | Discharge: 2019-01-05 | Disposition: A | Discharge status: Home / Self Care | Payer: BC Managed Care – PPO | Source: Ambulatory Visit

## 2019-01-05 ENCOUNTER — Telehealth: Payer: BLUE CROSS/BLUE SHIELD

## 2019-01-05 ENCOUNTER — Encounter (HOSPITAL_COMMUNITY): Payer: Self-pay

## 2019-01-05 DIAGNOSIS — X501XXA Overexertion from prolonged static or awkward postures, initial encounter: Secondary | ICD-10-CM

## 2019-01-05 DIAGNOSIS — S99922A Unspecified injury of left foot, initial encounter: Secondary | ICD-10-CM

## 2019-01-05 MED ORDER — IBUPROFEN 800 MG PO TABS: 800.0000 mg | ORAL_TABLET | Freq: Three times a day (TID) | ORAL | 0 refills | Status: DC

## 2019-01-05 NOTE — Discharge Instructions (Addendum)
Use anti-inflammatories for pain/swelling. You may take up to 800 mg Ibuprofen every 8 hours with food. You may supplement Ibuprofen with Tylenol [phone] mg every 8 hours.   Ice and Elevate  Follow up in person if not having improvement in weight bearing for Xray

## 2019-01-06 NOTE — ED Provider Notes (Signed)
Virtual Visit via Video Note:  Shannon Lopez  initiated request for [facility]. I connected with Shannon ColumbiaUmniyat Herrada  on 01/06/2019 at 8:07 AM  for a synchronized telemedicine visit using a video enabled HIPPA compliant telemedicine application. I verified that I am speaking with Shannon ColumbiaUmniyat Mccormac  using two identifiers. Shemia Bevel C Suhaan Perleberg, PA-C  was physically located in a [facility] site and Shannon ColumbiaUmniyat Hueber was located at a different location.   The limitations of evaluation and management by telemedicine as well as the availability of in-person appointments were discussed. Patient was informed that she  may incur a bill ( including co-pay) for this virtual visit encounter. Lavonya Smethers  expressed understanding and gave verbal consent to proceed with virtual visit.     History of Present Illness:Emelyn Martina SinnerHamza  is a 28 y.o. female presents with history of asthma, allergic rhinitis, presenting today for evaluation of foot injury.  Patient states that yesterday she was walking and accidentally rolled the top part of her foot.  She feels as if her ankle shifted to the side and her foot got caught.  Since she has developed pain and swelling in her left foot.  She had a slight sensation of tingling this morning.  She is use Tylenol 1000 mg throughout the day.  Denies any changes in color including denying any redness or bruising.  Denies any ankle pain.  Has pain to the bottom of her foot as well.  Denies previous injury to her foot.  Past Medical History:  Diagnosis Date  . Allergy   . Asthma   . Depression 06/18/2015  . Morbid obesity with BMI of 40.0-44.9, adult (HCC)     Allergies  Allergen Reactions  . Latex Hives        Observations/Objective: Physical Exam  Constitutional: She is oriented to person, place, and time and well-developed, well-nourished, and in no distress. No distress.  HENT:  Head: Normocephalic and atraumatic.  Nose: Nose  normal.  Neck: Normal range of motion. Neck supple.  Pulmonary/Chest: Effort normal. No respiratory distress.  Speaking in full sentences  Musculoskeletal: Normal range of motion.     Comments: Using all extremities appropriately  Attempted to show left foot, patient points to lateral aspect of left foot as area of pain, due to video quality amount of swelling difficult to ascertain, no changes to color noted  Neurological: She is alert and oriented to person, place, and time.  Skin: Skin is warm. No erythema.  Nursing note and vitals reviewed.     ICD-10-CM   1. Injury of left foot, initial encounter  [phone]     Assessment and Plan: Left foot injury: Discussed with patient unable to definitively determine sprain versus fracture.  Advised continued rest, ice, elevation, add in ibuprofen along with Tylenol to further help with pain and swelling.  Advised if weightbearing does not improve over the next 24 to 48 hours to follow-up in person to rule out fracture.Discussed strict return precautions. Patient verbalized understanding and is agreeable with plan.   Follow Up Instructions:   I discussed the assessment and treatment plan with the patient. The patient was provided an opportunity to ask questions and all were answered. The patient agreed with the plan and demonstrated an understanding of the instructions.   The patient was advised to call back or seek an in-person evaluation if the symptoms worsen or if the condition fails to improve as anticipated.    Arjuna Doeden  C Deztiny Sarra, PA-C  01/06/2019 8:07 AM         Janith Lima, PA-C 01/06/19 [phone]

## 2019-07-07 ENCOUNTER — Other Ambulatory Visit: Payer: Self-pay

## 2019-07-07 DIAGNOSIS — Z20822 Contact with and (suspected) exposure to covid-19: Secondary | ICD-10-CM

## 2019-07-08 LAB — NOVEL CORONAVIRUS, NAA: SARS-CoV-2, NAA: NOT DETECTED

## 2020-03-21 IMAGING — CR CHEST - 2 VIEW
2 series · 2 of 2 positions shown · non-contrast
Comparison: 10/12/2016

CLINICAL DATA: Chest pain

EXAM:
CHEST - 2 VIEW

[w chest pa]
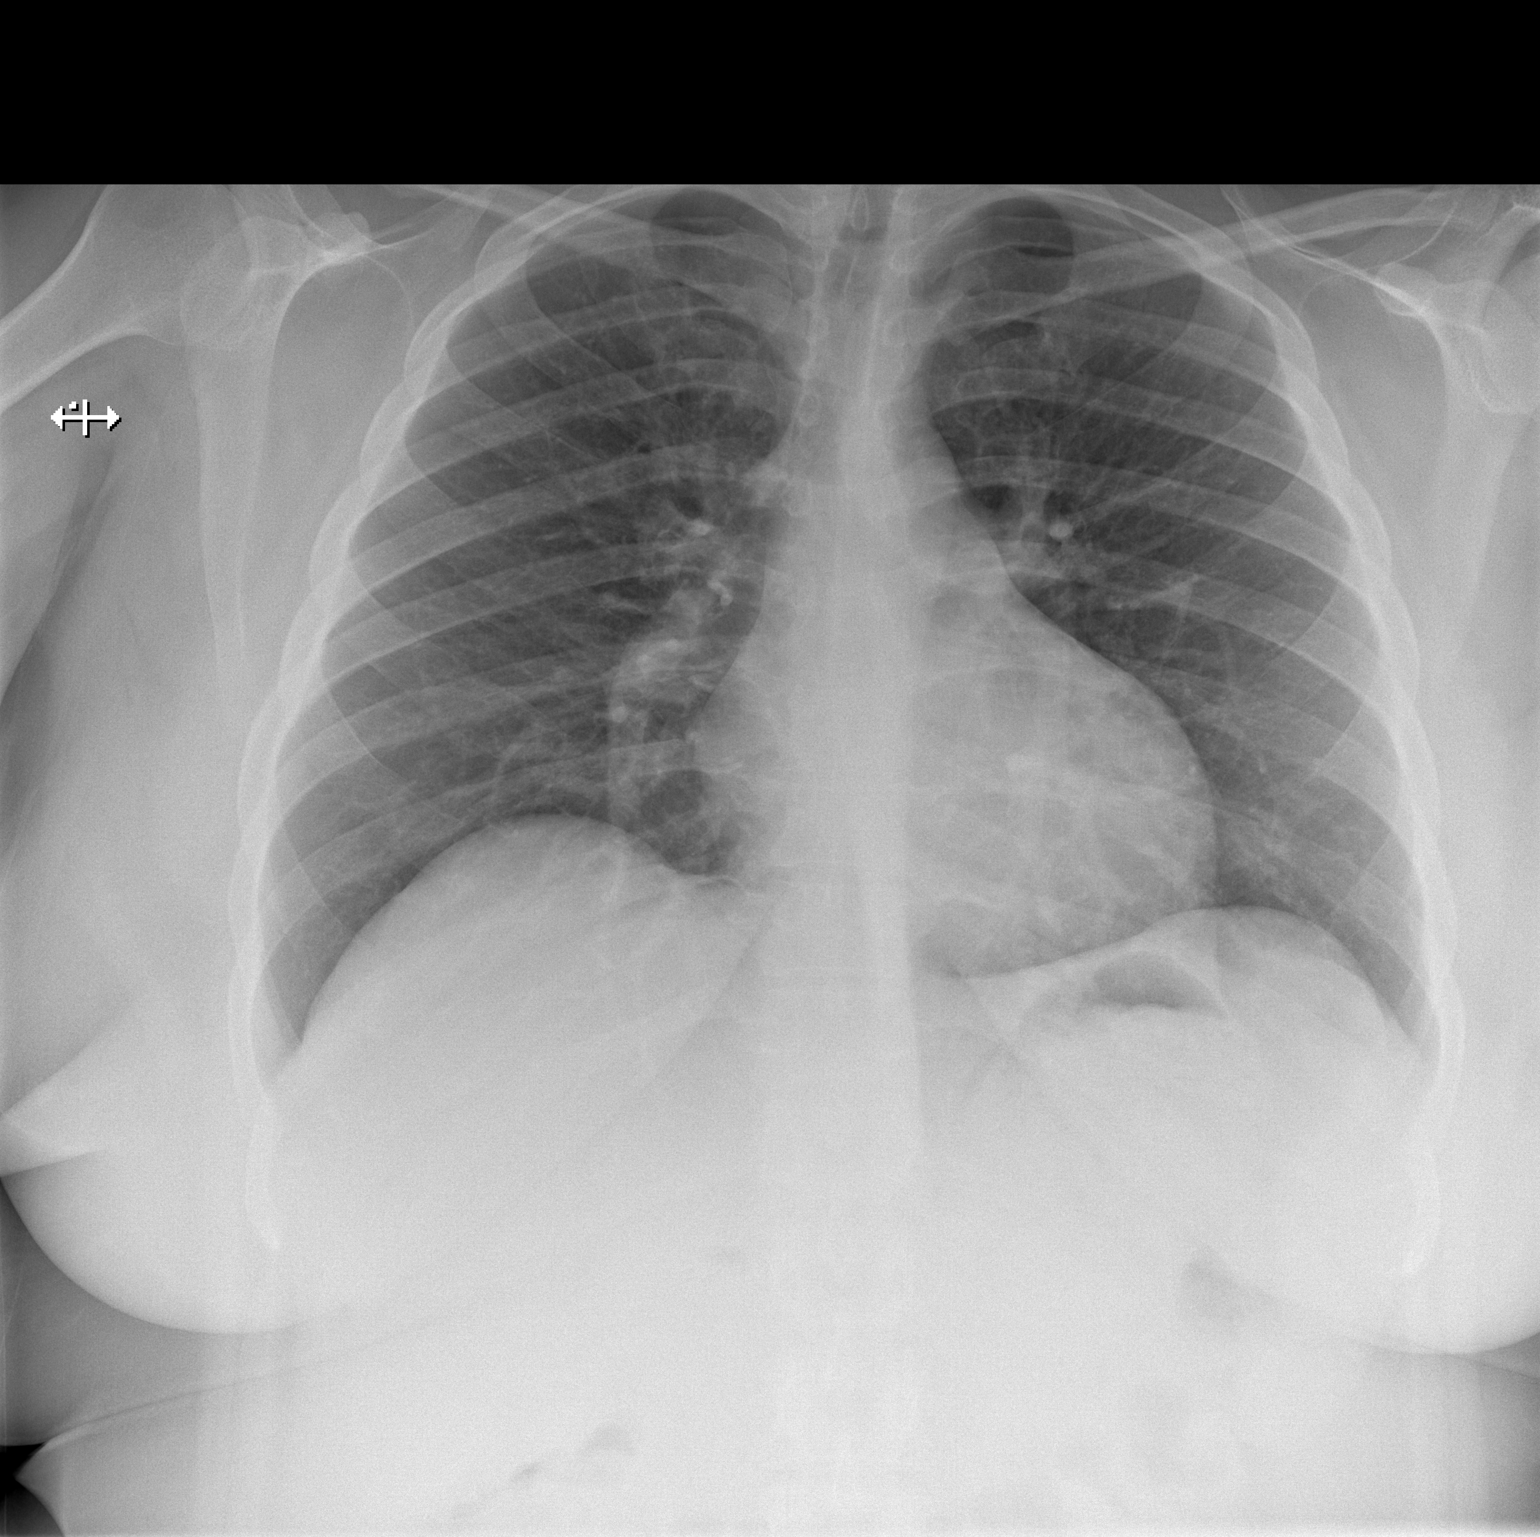

[w chest lat]
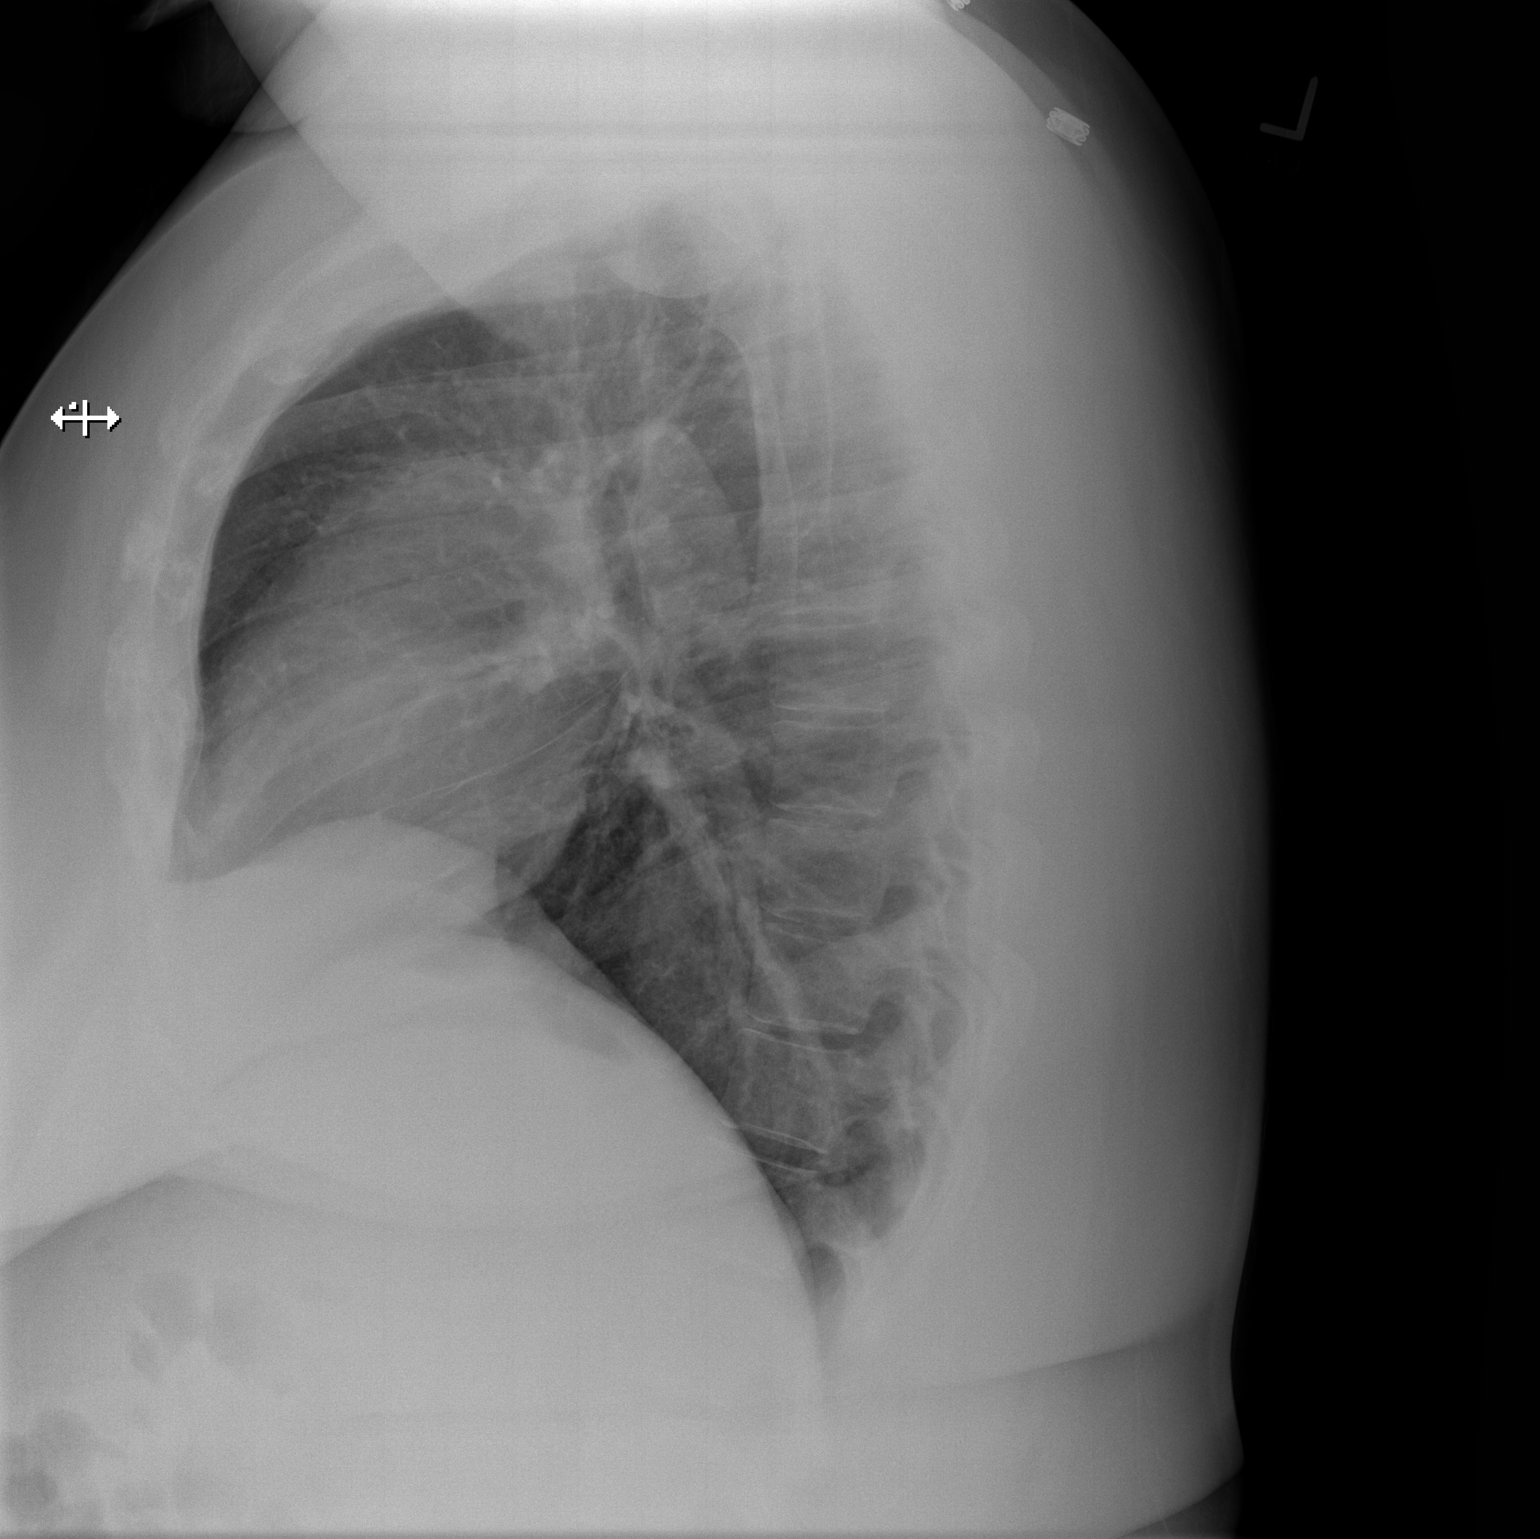

[2 of 2 positions shown; findings below may reference images not displayed]

FINDINGS: The heart size and mediastinal contours are within normal limits.
Both lungs are clear. The visualized skeletal structures are
unremarkable.
IMPRESSION: No active cardiopulmonary disease.

## 2020-08-22 IMAGING — US US PELVIS COMPLETE WITH TRANSVAGINAL
1 series · 13 of 25 positions shown · non-contrast
Comparison: 03/11/2018

CLINICAL DATA: Pelvic pain

EXAM:
TRANSABDOMINAL AND TRANSVAGINAL ULTRASOUND OF PELVIS
DOPPLER ULTRASOUND OF OVARIES
TECHNIQUE: Both transabdominal and transvaginal ultrasound examinations of the
pelvis were performed. Transabdominal technique was performed for
global imaging of the pelvis including uterus, ovaries, adnexal
regions, and pelvic cul-de-sac.
It was necessary to proceed with endovaginal exam following the
transabdominal exam to visualize the ovaries. Color and duplex
Doppler ultrasound was utilized to evaluate blood flow to the
ovaries.

[Series 1: us pelvis complete with transvaginal · 0.24mm/px · 59 acquisitions, 13 frames shown]
[im 1/59]
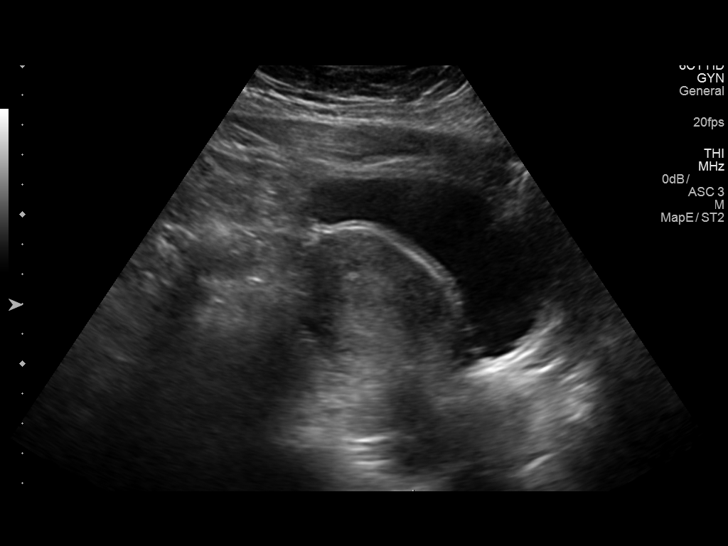
[im 5/59]
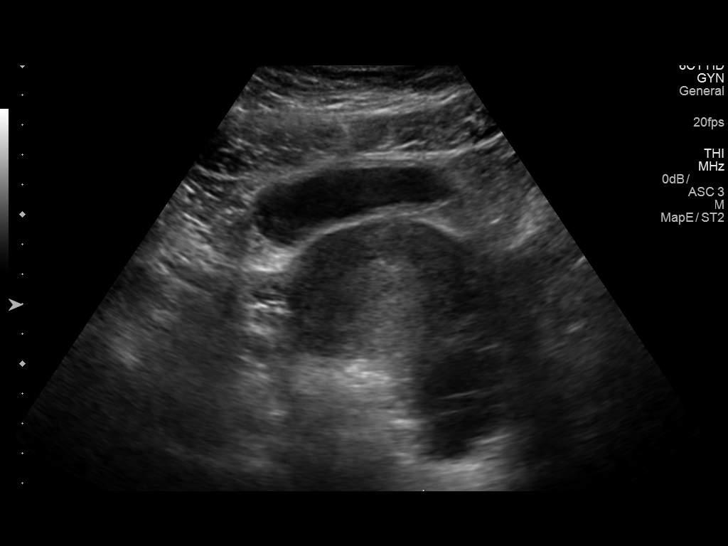
[im 10/59]
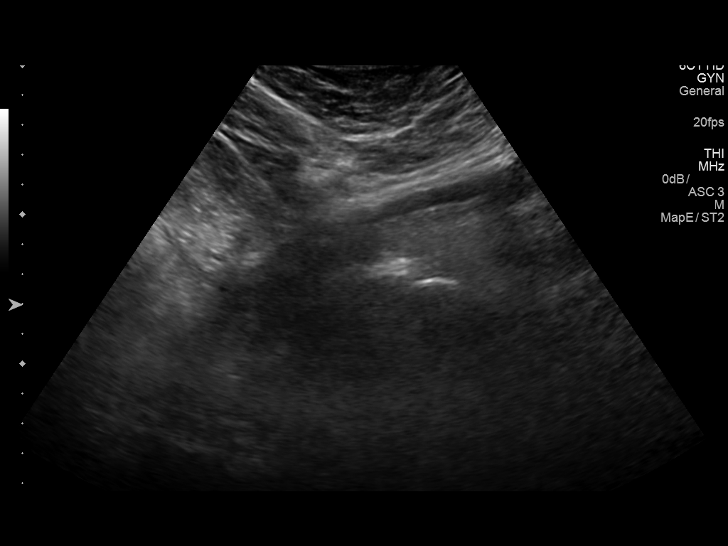
[im 15/59]
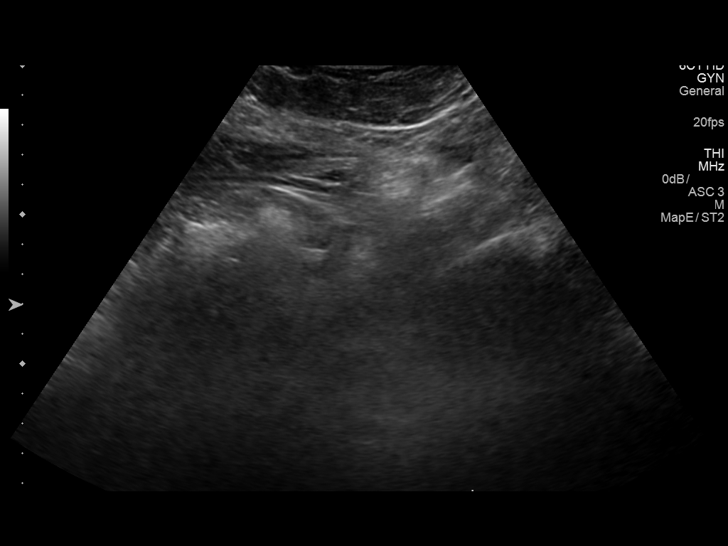
[im 20/59]
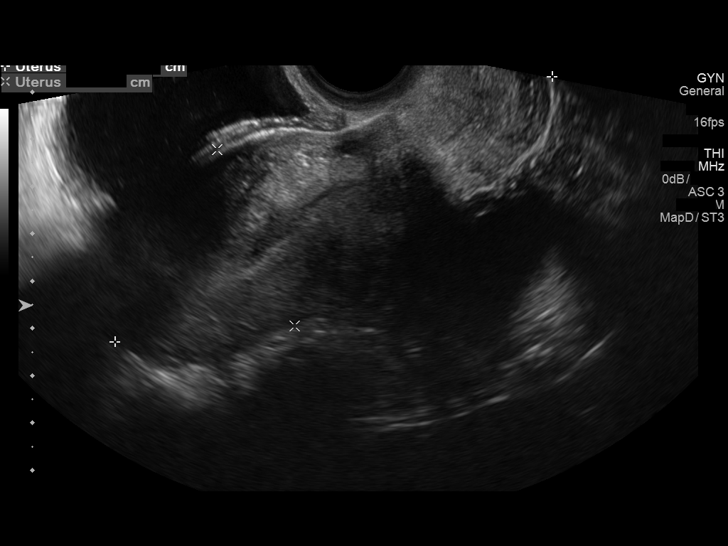
[im 25/59]
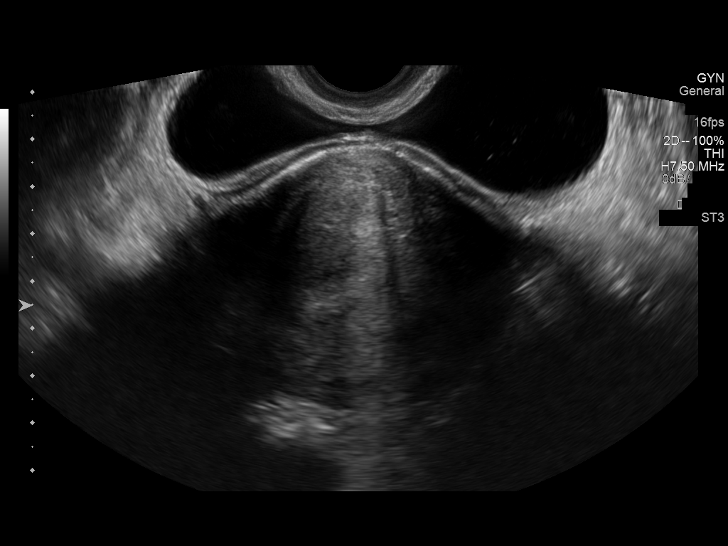
[im 30/59]
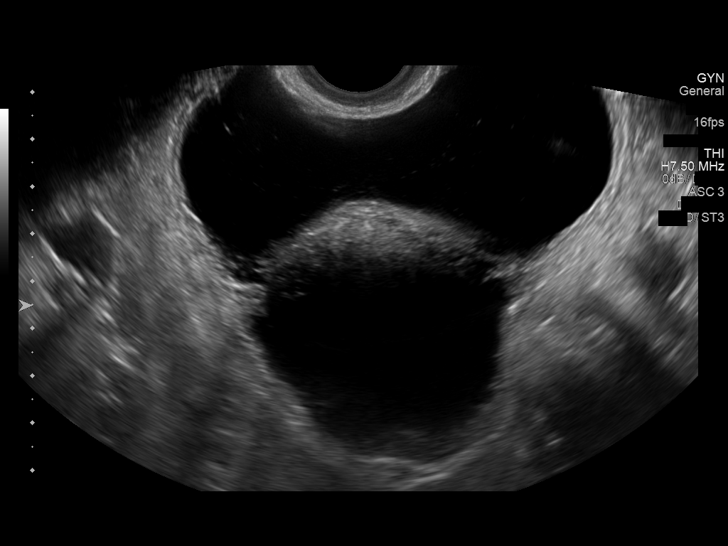
[im 34/59]
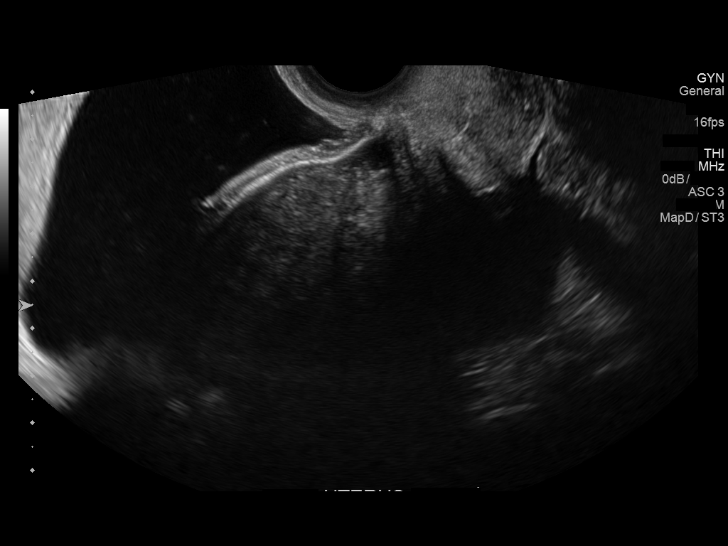
[im 39/59]
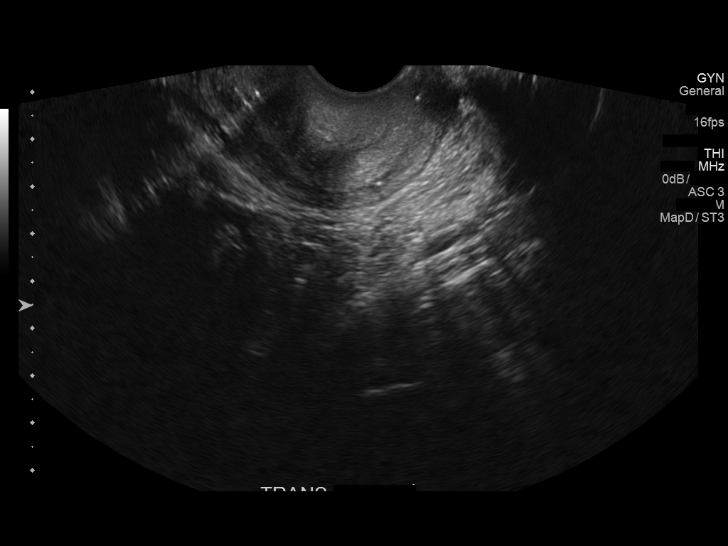
[im 44/59]
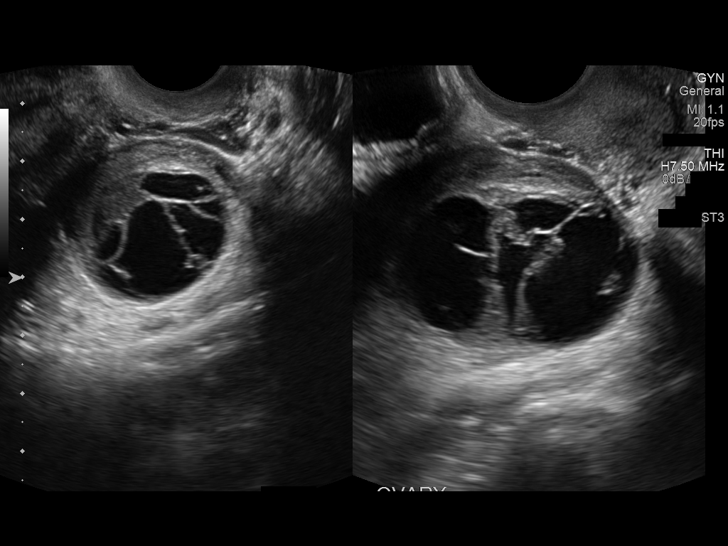
[im 49/59]
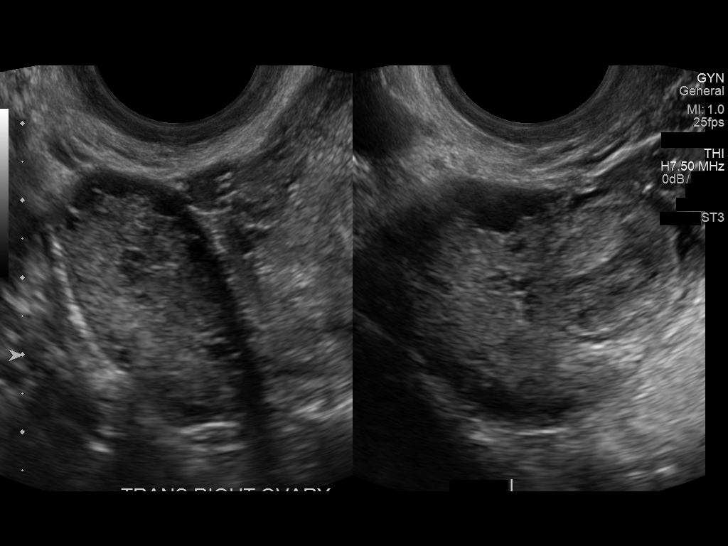
[im 54/59]
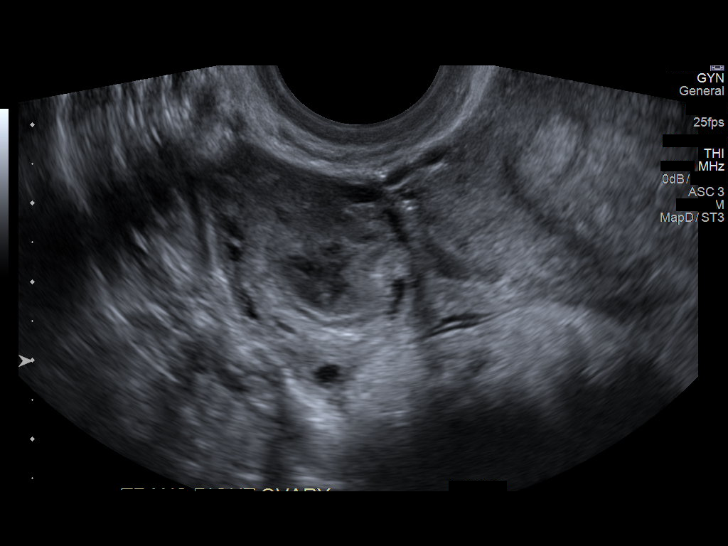
[im 59/59]
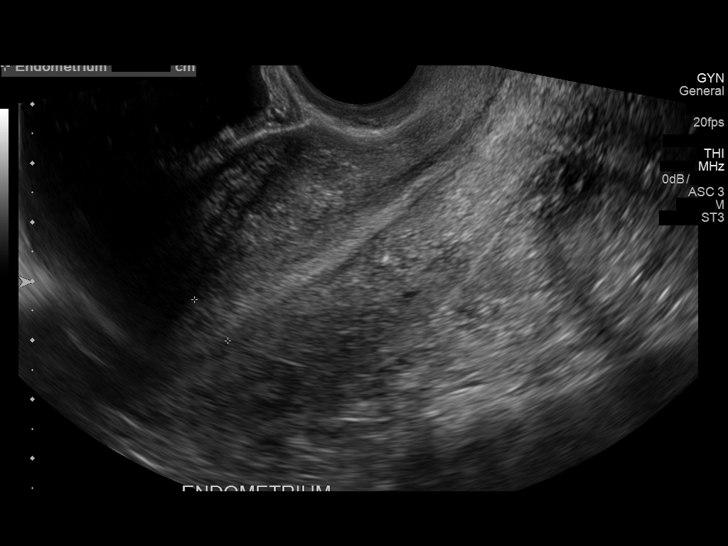

[13 of 25 positions shown; findings below may reference images not displayed]

FINDINGS: Uterus

Measurements: 11 x 4 x 6 cm.  No fibroids or other mass visualized.

Endometrium

Thickness: 9 mm.  No focal abnormality visualized.

Right ovary

Measurements: 39 x 27 x 20 mm . 21 mm avascular mass with internal
septations.

Left ovary

Measurements: 50 x 42 x 45 mm . 4.2 cm intra-ovarian mass with mid
level echoes and occasional lace-like appearance, avascular.

Pulsed Doppler evaluation of both ovaries demonstrates normal
low-resistance arterial and venous waveforms.

Other findings

No abnormal free fluid.
IMPRESSION: 1. Bilateral complex cystic ovarian masses not seen on pelvic
ultrasound February 2018, favor hemorrhagic cysts. Recommend follow-up
pelvic ultrasound in 6-12 weeks.
2. Normal ovarian blood flow.

## 2021-03-01 ENCOUNTER — Encounter (HOSPITAL_COMMUNITY): Payer: Self-pay

## 2021-03-01 ENCOUNTER — Other Ambulatory Visit: Payer: Self-pay

## 2021-03-01 ENCOUNTER — Ambulatory Visit (HOSPITAL_COMMUNITY)
Admission: EM | Admit: 2021-03-01 | Discharge: 2021-03-01 | Disposition: A | Discharge status: Home / Self Care | Payer: BC Managed Care – PPO | Attending: Family Medicine | Admitting: Family Medicine

## 2021-03-01 DIAGNOSIS — R739 Hyperglycemia, unspecified: Secondary | ICD-10-CM

## 2021-03-01 DIAGNOSIS — R531 Weakness: Secondary | ICD-10-CM

## 2021-03-01 DIAGNOSIS — R42 Dizziness and giddiness: Secondary | ICD-10-CM

## 2021-03-01 LAB — CBG MONITORING, ED: Glucose-Capillary: 136 mg/dL — ABNORMAL HIGH (ref 70–99)

## 2021-03-01 LAB — POCT URINALYSIS DIPSTICK, ED / UC
Glucose, UA: NEGATIVE mg/dL
Leukocytes,Ua: NEGATIVE
Nitrite: NEGATIVE
Protein, ur: >=300 mg/dL — AB
Specific Gravity, Urine: >=1.03 (ref 1.005–1.030)
Urobilinogen, UA: 0.2 mg/dL (ref 0.0–1.0)
pH: 5.5 (ref 5.0–8.0)

## 2021-03-01 LAB — POC URINE PREG, ED: Preg Test, Ur: NEGATIVE

## 2021-03-01 NOTE — ED Triage Notes (Signed)
Pt presents with weakness and states she was at work feeling nauseas, dizzy, lightheaded. States she was sitting down and sweating. Pt states her left arm and leg feel tight.  States she has SOB.  Denies chest pain. States she cannot talk for a long period of time. States she will feel tired.

## 2021-03-01 NOTE — Discharge Instructions (Addendum)
I would recommend check a fasting blood sugar at some point. You may return here one morning if you wish to do this here.  Please do your best to ensure adequate fluid intake in order to avoid dehydration. If you find that you are unable to tolerate drinking fluids regularly please proceed to the Emergency Department for evaluation.

## 2021-03-03 NOTE — ED Provider Notes (Signed)
[facility]   [id number] 03/01/21 Arrival Time: 1718  ASSESSMENT & PLAN:  1. Lightheadedness   2. Weakness   3. Elevated blood sugar    Unclear etiology. Possible early viral illness. Discussed elevated blood sugar. Normal neurologic exam. No indication for neurodiagnostic imaging at this time. Discussed. She is comfortable with home observation next 24-48 hours.  Labs Reviewed  POCT URINALYSIS DIPSTICK, ED / UC - Abnormal; Notable for the following components:      Result Value   Bilirubin Urine SMALL (*)    Ketones, ur TRACE (*)    Hgb urine dipstick LARGE (*)    Protein, ur >=300 (*)    All other components within normal limits  CBG MONITORING, ED - Abnormal; Notable for the following components:   Glucose-Capillary 136 (*)    All other components within normal limits  POC URINE PREG, ED     Discharge Instructions      I would recommend check a fasting blood sugar at some point. You may return here one morning if you wish to do this here.  Please do your best to ensure adequate fluid intake in order to avoid dehydration. If you find that you are unable to tolerate drinking fluids regularly please proceed to the Emergency Department for evaluation.       Reassured that these symptoms do not appear to represent a serious or threatening condition. Will proceed to the ED if she develops other symptoms such as alterations of speech, swallowing, vision, motor/sensory systems, or if dizziness worsens.  Reviewed expectations re: course of current medical issues. Questions answered. Outlined signs and symptoms indicating need for more acute intervention. Patient verbalized understanding. After Visit Summary given.   SUBJECTIVE:  Shannon Lopez is a 30 y.o. female who reports "feeling weak and tired" Past sev days. Occas lightheadedness; transient; no vertigo. Nausea without emesis. Tolerating PO fluids. Reports occas SOB but feels this may be anxiety. No CP.   Previous workup/treatments: none. H/O similar: no. Therapies tried thus far: none.   Social History   Substance and Sexual Activity  Alcohol Use No   Alcohol/week: 0.0 standard drinks   Social History   Tobacco Use  Smoking Status Former   Types: Cigarettes   Quit date: 2015   Years since quitting: 7.6  Smokeless Tobacco Never   Denies street drug use.   OBJECTIVE:  Vitals:   03/01/21 1739  BP: 114/82  Pulse: 80  Resp: 17  TempSrc: Oral  SpO2: 100%    General appearance: alert; no distress Eyes: PERRLA; EOMI; conjunctiva normal HENT: normocephalic; atraumatic; TMs normal; nasal mucosa normal; oral mucosa normal Neck: supple with FROM Lungs: clear to auscultation bilaterally Heart: regular rate and rhythm Abdomen: soft, non-tender; bowel sounds normal Extremities: no cyanosis or edema; symmetrical with no gross deformities Skin: warm and dry Neurologic: normal gait; DTR's normal and symmetric; CN 2-12 grossly intact; rapid changes in position during the exam do not precipitate dizziness Psychological: alert and cooperative; normal mood and affect  Investigations: Results for orders placed or performed during the hospital encounter of 03/01/21  POC Urinalysis dipstick  Result Value Ref Range   Glucose, UA NEGATIVE NEGATIVE mg/dL   Bilirubin Urine SMALL (A) NEGATIVE   Ketones, ur TRACE (A) NEGATIVE mg/dL   Specific Gravity, Urine >=1.030 1.005 - 1.030   Hgb urine dipstick LARGE (A) NEGATIVE   pH 5.5 5.0 - 8.0   Protein, ur >=300 (A) NEGATIVE mg/dL   Urobilinogen, UA 0.2 0.0 -  1.0 mg/dL   Nitrite NEGATIVE NEGATIVE   Leukocytes,Ua NEGATIVE NEGATIVE  POC urine pregnancy  Result Value Ref Range   Preg Test, Ur NEGATIVE NEGATIVE  POC CBG monitoring  Result Value Ref Range   Glucose-Capillary 136 (H) 70 - 99 mg/dL   Labs Reviewed  POCT URINALYSIS DIPSTICK, ED / UC - Abnormal; Notable for the following components:      Result Value   Bilirubin Urine SMALL  (*)    Ketones, ur TRACE (*)    Hgb urine dipstick LARGE (*)    Protein, ur >=300 (*)    All other components within normal limits  CBG MONITORING, ED - Abnormal; Notable for the following components:   Glucose-Capillary 136 (*)    All other components within normal limits  POC URINE PREG, ED    Allergies  Allergen Reactions   Latex Hives    Past Medical History:  Diagnosis Date   Allergy    Asthma    Depression 06/18/2015   Morbid obesity with BMI of 40.0-44.9, adult (HCC)    Social History   Socioeconomic History   Marital status: Married    Spouse name: Not on file   Number of children: 0   Years of education: Not on file   Highest education level: Not on file  Occupational History   Occupation: student  Tobacco Use   Smoking status: Former    Types: Cigarettes    Quit date: 2015    Years since quitting: 7.6   Smokeless tobacco: Never  Substance and Sexual Activity   Alcohol use: No    Alcohol/week: 0.0 standard drinks   Drug use: No   Sexual activity: Not on file  Other Topics Concern   Not on file  Social History Narrative   Not on file   Social Determinants of Health   Financial Resource Strain: Not on file  Food Insecurity: Not on file  Transportation Needs: Not on file  Physical Activity: Not on file  Stress: Not on file  Social Connections: Not on file  Intimate Partner Violence: Not on file   Family History  Problem Relation Age of Onset   Hyperlipidemia Father    Heart disease Father    Diabetes Father    Irritable bowel syndrome Father    Diabetes Maternal Grandmother        both sides of the family   Irritable bowel syndrome Sister    History reviewed. No pertinent surgical history.     Mardella Layman, MD 03/05/21 [phone]

## 2021-04-07 ENCOUNTER — Emergency Department (HOSPITAL_COMMUNITY)
Admission: EM | Admit: 2021-04-07 | Discharge: 2021-04-07 | Disposition: A | Discharge status: Home / Self Care | Payer: Self-pay | Attending: Emergency Medicine | Admitting: Emergency Medicine

## 2021-04-07 ENCOUNTER — Other Ambulatory Visit: Payer: Self-pay

## 2021-04-07 ENCOUNTER — Encounter (HOSPITAL_COMMUNITY): Payer: Self-pay

## 2021-04-07 DIAGNOSIS — F10920 Alcohol use, unspecified with intoxication, uncomplicated: Secondary | ICD-10-CM

## 2021-04-07 DIAGNOSIS — J45909 Unspecified asthma, uncomplicated: Secondary | ICD-10-CM | POA: Insufficient documentation

## 2021-04-07 DIAGNOSIS — F10129 Alcohol abuse with intoxication, unspecified: Secondary | ICD-10-CM | POA: Insufficient documentation

## 2021-04-07 DIAGNOSIS — Z91040 Latex allergy status: Secondary | ICD-10-CM | POA: Insufficient documentation

## 2021-04-07 DIAGNOSIS — Z87891 Personal history of nicotine dependence: Secondary | ICD-10-CM | POA: Insufficient documentation

## 2021-04-07 DIAGNOSIS — Z79899 Other long term (current) drug therapy: Secondary | ICD-10-CM | POA: Insufficient documentation

## 2021-04-07 LAB — CBC
HCT: 42.6 % (ref 36.0–46.0)
Hemoglobin: 14.1 g/dL (ref 12.0–15.0)
MCH: 28.4 pg (ref 26.0–34.0)
MCHC: 33.1 g/dL (ref 30.0–36.0)
MCV: 85.9 fL (ref 80.0–100.0)
Platelets: 392 10*3/uL (ref 150–400)
RBC: 4.96 MIL/uL (ref 3.87–5.11)
RDW: 13 % (ref 11.5–15.5)
WBC: 6.8 10*3/uL (ref 4.0–10.5)
nRBC: 0 % (ref 0.0–0.2)

## 2021-04-07 LAB — COMPREHENSIVE METABOLIC PANEL
ALT: 18 U/L (ref 0–44)
AST: 16 U/L (ref 15–41)
Albumin: 3.8 g/dL (ref 3.5–5.0)
Alkaline Phosphatase: 68 U/L (ref 38–126)
Anion gap: 10 (ref 5–15)
BUN: 8 mg/dL (ref 6–20)
CO2: 23 mmol/L (ref 22–32)
Calcium: 8.6 mg/dL — ABNORMAL LOW (ref 8.9–10.3)
Chloride: 110 mmol/L (ref 98–111)
Creatinine, Ser: 0.51 mg/dL (ref 0.44–1.00)
GFR, Estimated: >60 mL/min (ref >60)
Glucose, Bld: 98 mg/dL (ref 70–99)
Potassium: 3.4 mmol/L — ABNORMAL LOW (ref 3.5–5.1)
Sodium: 143 mmol/L (ref 135–145)
Total Bilirubin: 0.4 mg/dL (ref 0.3–1.2)
Total Protein: 7.7 g/dL (ref 6.5–8.1)

## 2021-04-07 LAB — ETHANOL: Alcohol, Ethyl (B): 156 mg/dL — ABNORMAL HIGH (ref <10)

## 2021-04-07 LAB — SALICYLATE LEVEL: Salicylate Lvl: <7 mg/dL — ABNORMAL LOW (ref 7.0–30.0)

## 2021-04-07 LAB — ACETAMINOPHEN LEVEL: Acetaminophen (Tylenol), Serum: <10 ug/mL — ABNORMAL LOW (ref 10–30)

## 2021-04-07 LAB — HCG, QUANTITATIVE, PREGNANCY: hCG, Beta Chain, Quant, S: <1 m[IU]/mL (ref <5)

## 2021-04-07 NOTE — ED Triage Notes (Signed)
Patient arrives via EMS from home. Patient was found lying in bathtub after consuming large quantity of vodka. Pt surrounded by tylenol and metformin bottles, unable to determine how much was taken. Hx of anxiety, pt reports family stressors at home.  20 g RAC  EMS vitals: BP 150/100 SPO2 98%

## 2021-04-07 NOTE — Discharge Instructions (Signed)
Your history, exam, work-up today are consistent with alcohol intoxication causing some of your symptoms.  After our long discussion, I do not feel you are an imminent threat to yourself or others however I am concerned that with everything you went through today, I do want you to follow-up with a mental health specialist or psychiatrist.  Please utilize the resources and rest and stay hydrated.  If you start to have any thoughts of hurting yourself, please return to the nearest emergency department.

## 2021-04-07 NOTE — ED Provider Notes (Signed)
[facility] Provider Note   CSN: [id number] Arrival date & time: 04/07/21  1916     History No chief complaint on file.   Shannon Lopez is a 30 y.o. female.  The history is provided by the patient and medical records. No language interpreter was used.  Mental Health Problem Presenting symptoms: agitation   Presenting symptoms: no delusions, no hallucinations, no homicidal ideas, no paranoid behavior, no self-mutilation, no suicidal thoughts, no suicidal threats and no suicide attempt   Presenting symptoms comment:  Crying Degree of incapacity (severity):  Severe Onset quality:  Gradual Duration:  1 day Timing:  Constant Progression:  Unchanged Chronicity:  New Context: alcohol use and stressful life event   Context: not drug abuse   Treatment compliance:  Untreated Relieved by:  Nothing Worsened by:  Alcohol Ineffective treatments:  None tried Associated symptoms: no abdominal pain, no anxiety, no chest pain, no fatigue, no headaches and no irritability   Risk factors: no hx of mental illness and no hx of suicide attempts       Past Medical History:  Diagnosis Date   Allergy    Asthma    Depression 06/18/2015   Morbid obesity with BMI of 40.0-44.9, adult [facility])     Patient Active Problem List   Diagnosis Date Noted   Polycystic ovaries 02/18/2017   Asthma 05/20/2014    History reviewed. No pertinent surgical history.   OB History     Gravida  2   Para      Term  0   Preterm  0   AB  1   Living  0      SAB      IAB      Ectopic      Multiple      Live Births              Family History  Problem Relation Age of Onset   Hyperlipidemia Father    Heart disease Father    Diabetes Father    Irritable bowel syndrome Father    Diabetes Maternal Grandmother        both sides of the family   Irritable bowel syndrome Sister     Social History   Tobacco Use   Smoking status: Former    Types:  Cigarettes    Quit date: 2015    Years since quitting: 7.7   Smokeless tobacco: Never  Substance Use Topics   Alcohol use: No    Alcohol/week: 0.0 standard drinks   Drug use: No    Home Medications Prior to Admission medications   Medication Sig Start Date End Date Taking? Authorizing Provider  acetaminophen (TYLENOL) 500 MG tablet Take 500 mg by mouth every 6 (six) hours as needed for mild pain or headache.    [provider]  albuterol (PROVENTIL HFA;VENTOLIN HFA) 108 (90 Base) MCG/ACT inhaler Inhale 2 puffs into the lungs every 4 (four) hours as needed for wheezing or shortness of breath. 01/04/17 10/18/18  Loletta Specter, PA-C  beclomethasone (QVAR) 80 MCG/ACT inhaler Inhale 2 puffs into the lungs daily.    [provider]  benzonatate (TESSALON) 100 MG capsule Take 1 capsule (100 mg total) by mouth 3 (three) times daily. 11/14/18   Ward, Tylene Fantasia, PA-C  ibuprofen (ADVIL) 800 MG tablet Take 1 tablet (800 mg total) by mouth 3 (three) times daily. 01/05/19   Wieters, Hallie C, PA-C  metFORMIN (GLUCOPHAGE) 500 MG tablet Take 1  tablet (500 mg total) by mouth 3 (three) times daily as needed. Patient not taking: Reported on 03/11/2018 01/14/17   Loletta Specter, PA-C  montelukast (SINGULAIR) 10 MG tablet Take 1 tablet (10 mg total) by mouth at bedtime. 04/19/15   Porfirio Oar, PA  Multiple Vitamin (MULTIVITAMIN WITH MINERALS) TABS tablet Take 1 tablet by mouth daily.    [provider]  ondansetron (ZOFRAN ODT) 4 MG disintegrating tablet Take 1 tablet (4 mg total) by mouth every 8 (eight) hours as needed for nausea or vomiting. Patient not taking: Reported on 10/18/2018 09/21/18   Antony Madura, PA-C  promethazine-dextromethorphan (PROMETHAZINE-DM) 6.25-15 MG/5ML syrup Take 5 mLs by mouth 4 (four) times daily as needed for cough. 11/14/18   Ward, Tylene Fantasia, PA-C    Allergies    Latex  Review of Systems   Review of Systems  Constitutional:  Negative for chills,  diaphoresis, fatigue and irritability.  HENT:  Negative for congestion.   Eyes:  Negative for visual disturbance.  Respiratory:  Negative for cough, chest tightness, shortness of breath and wheezing.   Cardiovascular:  Negative for chest pain.  Gastrointestinal:  Negative for abdominal pain, constipation, diarrhea, nausea and vomiting.  Genitourinary:  Negative for dysuria.  Musculoskeletal:  Negative for back pain and neck pain.  Skin:  Negative for rash and wound.  Neurological:  Negative for dizziness, weakness, light-headedness, numbness and headaches.  Psychiatric/Behavioral:  Positive for agitation. Negative for confusion, hallucinations, homicidal ideas, paranoia, self-injury and suicidal ideas. The patient is not nervous/anxious.   All other systems reviewed and are negative.  Physical Exam Updated Vital Signs BP (!) 130/98 (BP Location: Left Arm)   Pulse 65   Temp 98.1 F (36.7 C) (Oral)   Resp 19   Ht 5\' 1"  (1.549 m)   Wt 106.6 kg   SpO2 100%   BMI 44.40 kg/m   Physical Exam Vitals and nursing note reviewed.  Constitutional:      General: She is not in acute distress.    Appearance: She is well-developed. She is not ill-appearing, toxic-appearing or diaphoretic.  HENT:     Head: Normocephalic and atraumatic.     Nose: No congestion or rhinorrhea.     Mouth/Throat:     Mouth: Mucous membranes are moist.     Pharynx: No oropharyngeal exudate or posterior oropharyngeal erythema.  Eyes:     Extraocular Movements: Extraocular movements intact.     Conjunctiva/sclera: Conjunctivae normal.     Pupils: Pupils are equal, round, and reactive to light.  Cardiovascular:     Rate and Rhythm: Regular rhythm. Tachycardia present.     Heart sounds: No murmur heard. Pulmonary:     Effort: Pulmonary effort is normal. No respiratory distress.     Breath sounds: Normal breath sounds. No wheezing, rhonchi or rales.  Chest:     Chest wall: No tenderness.  Abdominal:     General:  Abdomen is flat.     Palpations: Abdomen is soft.     Tenderness: There is no abdominal tenderness. There is no guarding or rebound.  Musculoskeletal:        General: No tenderness.     Cervical back: Neck supple. No tenderness.     Right lower leg: No edema.     Left lower leg: No edema.  Skin:    General: Skin is warm and dry.     Findings: No erythema.  Neurological:     General: No focal deficit present.  Mental Status: She is alert.     Sensory: No sensory deficit.     Motor: No weakness.     Coordination: Coordination normal.    ED Results / Procedures / Treatments   Labs (all labs ordered are listed, but only abnormal results are displayed) Labs Reviewed  COMPREHENSIVE METABOLIC PANEL - Abnormal; Notable for the following components:      Result Value   Potassium 3.4 (*)    Calcium 8.6 (*)    All other components within normal limits  ETHANOL - Abnormal; Notable for the following components:   Alcohol, Ethyl (B) 156 (*)    All other components within normal limits  SALICYLATE LEVEL - Abnormal; Notable for the following components:   Salicylate Lvl <7.0 (*)    All other components within normal limits  ACETAMINOPHEN LEVEL - Abnormal; Notable for the following components:   Acetaminophen (Tylenol), Serum <10 (*)    All other components within normal limits  CBC  HCG, QUANTITATIVE, PREGNANCY    EKG EKG Interpretation  Date/Time:  Monday April 07 2021 20:47:33 EDT Ventricular Rate:  58 PR Interval:  143 QRS Duration: 102 QT Interval:  396 QTC Calculation: 389 R Axis:   80 Text Interpretation: Sinus rhythm Atrial premature complex Borderline ST elevation, lateral leads Baseline wander in lead(s) V5 Baseline wander initially, Similar to prior otherwise. No STEMI Confirmed by Theda Belfast ([id number]) on 04/07/2021 8:49:01 PM  Radiology No results found.  Procedures Procedures   Medications Ordered in ED Medications - No data to display  ED Course  I  have reviewed the triage vital signs and the nursing notes.  Pertinent labs & imaging results that were available during my care of the patient were reviewed by me and considered in my medical decision making (see chart for details).    MDM Rules/Calculators/A&P                           Shannon Lopez is a 30 y.o. female with a past medical history significant for asthma, PCOS, obesity, and depression who presents with intoxication, tearfulness, and concern for possible overdose.  According to patient, she has been going through a lot over the last 24 hours including her parents "disowning her" and her ending a long-term relationship today as well.  She reports that she is finishing up all of her requirements for medical school application and is under a lot of stress.  She reports she is not having any suicidal thoughts or homicidal thoughts and does not want to kill herself.   EMS told nursing that they did not think she actually tried to kill herself but was just very upset and intoxicated and they were called.  According to patient, she does not drink any alcohol normally and drank a large quantity of vodka to help deal with everything today.  She says that she had recently cleaned out her pill bottles with her Tylenol and metformin but she says she did not take any of them.  She even says that we can "test me to make sure".  She says that she has never tried herself in the past and does not want to hurt her self now because of everything she is already gone through to get to the point where she is getting ready to apply to medical school.  Patient says that she is just intoxicated and her roommates were concerned about how she was acting.  On exam, lungs  are clear and chest is nontender.  Patient is very tearful and tachycardic.  Abdomen nontender.  Back nontender.  Clear breath sounds.  No focal neurologic deficits.  Clinically I do not suspect patient tried to kill herself.  I do not think  she is to be under IVC at this time.  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  We will get the screening labs for medical clearance however we will also put in a TTS consult now to keep things moving for her.  Anticipate discharge after speaking with psychiatry and metabolizing the alcohol tonight.  10:12 PM Patient's work-up returned confirming intoxication but otherwise was reassuring.  Does not want to wait to talk to psychiatry and wants to now go home.  As I do not think she is a threat to herself and others and she appears to have metabolized over alcohol, I do feel she is safe for discharge home.  Patient was given resources for outpatient follow-up and she agrees.  She understands return precautions and was discharged in good condition with some else drive her home.   Final Clinical Impression(s) / ED Diagnoses Final diagnoses:  Alcoholic intoxication without complication (HCC)    Rx / DC Orders ED Discharge Orders     None      Clinical Impression: 1. Alcoholic intoxication without complication (HCC)     Disposition: Discharge  Condition: Good  I have discussed the results, Dx and Tx plan with the pt(& family if present). He/she/they expressed understanding and agree(s) with the plan. Discharge instructions discussed at great length. Strict return precautions discussed and pt &/or family have verbalized understanding of the instructions. No further questions at time of discharge.    New Prescriptions   No medications on file    Follow Up: [facility] [address] [id number] mc [place] [address] [phone]       Fares Ramthun, Canary Brim,  MD 04/07/21 2213

## 2022-03-04 ENCOUNTER — Emergency Department (HOSPITAL_BASED_OUTPATIENT_CLINIC_OR_DEPARTMENT_OTHER): Payer: Self-pay | Admitting: Radiology

## 2022-03-04 ENCOUNTER — Encounter (HOSPITAL_BASED_OUTPATIENT_CLINIC_OR_DEPARTMENT_OTHER): Payer: Self-pay | Admitting: Emergency Medicine

## 2022-03-04 ENCOUNTER — Encounter (HOSPITAL_COMMUNITY): Payer: Self-pay

## 2022-03-04 ENCOUNTER — Emergency Department (HOSPITAL_BASED_OUTPATIENT_CLINIC_OR_DEPARTMENT_OTHER)
Admission: EM | Admit: 2022-03-04 | Discharge: 2022-03-04 | Disposition: A | Discharge status: Home / Self Care | Payer: Self-pay | Attending: Emergency Medicine | Admitting: Emergency Medicine

## 2022-03-04 ENCOUNTER — Other Ambulatory Visit: Payer: Self-pay

## 2022-03-04 DIAGNOSIS — J45901 Unspecified asthma with (acute) exacerbation: Secondary | ICD-10-CM | POA: Diagnosis present

## 2022-03-04 DIAGNOSIS — J4541 Moderate persistent asthma with (acute) exacerbation: Secondary | ICD-10-CM | POA: Insufficient documentation

## 2022-03-04 DIAGNOSIS — Z91040 Latex allergy status: Secondary | ICD-10-CM | POA: Insufficient documentation

## 2022-03-04 DIAGNOSIS — Z20822 Contact with and (suspected) exposure to covid-19: Secondary | ICD-10-CM | POA: Insufficient documentation

## 2022-03-04 LAB — CBC WITH DIFFERENTIAL/PLATELET
Abs Immature Granulocytes: 0.02 10*3/uL (ref 0.00–0.07)
Basophils Absolute: 0 10*3/uL (ref 0.0–0.1)
Basophils Relative: 1 %
Eosinophils Absolute: 0.5 10*3/uL (ref 0.0–0.5)
Eosinophils Relative: 8 %
HCT: 42.2 % (ref 36.0–46.0)
Hemoglobin: 14.1 g/dL (ref 12.0–15.0)
Immature Granulocytes: 0 %
Lymphocytes Relative: 22 %
Lymphs Abs: 1.2 10*3/uL (ref 0.7–4.0)
MCH: 27.7 pg (ref 26.0–34.0)
MCHC: 33.4 g/dL (ref 30.0–36.0)
MCV: 82.9 fL (ref 80.0–100.0)
Monocytes Absolute: 0.6 10*3/uL (ref 0.1–1.0)
Monocytes Relative: 10 %
Neutro Abs: 3.3 10*3/uL (ref 1.7–7.7)
Neutrophils Relative %: 59 %
Platelets: 315 10*3/uL (ref 150–400)
RBC: 5.09 MIL/uL (ref 3.87–5.11)
RDW: 14.3 % (ref 11.5–15.5)
WBC: 5.7 10*3/uL (ref 4.0–10.5)
nRBC: 0 % (ref 0.0–0.2)

## 2022-03-04 LAB — BASIC METABOLIC PANEL
Anion gap: 10 (ref 5–15)
BUN: 17 mg/dL (ref 6–20)
CO2: 22 mmol/L (ref 22–32)
Calcium: 9.1 mg/dL (ref 8.9–10.3)
Chloride: 102 mmol/L (ref 98–111)
Creatinine, Ser: 0.82 mg/dL (ref 0.44–1.00)
GFR, Estimated: >60 mL/min (ref >60)
Glucose, Bld: 119 mg/dL — ABNORMAL HIGH (ref 70–99)
Potassium: 3.5 mmol/L (ref 3.5–5.1)
Sodium: 134 mmol/L — ABNORMAL LOW (ref 135–145)

## 2022-03-04 LAB — RESP PANEL BY RT-PCR (FLU A&B, COVID) ARPGX2
Influenza A by PCR: NEGATIVE
Influenza B by PCR: NEGATIVE
SARS Coronavirus 2 by RT PCR: NEGATIVE

## 2022-03-04 MED ORDER — IPRATROPIUM-ALBUTEROL 0.5-2.5 (3) MG/3ML IN SOLN
3.0000 mL | RESPIRATORY_TRACT | Status: AC
  Administered 2022-03-04 (×3): 3 mL via RESPIRATORY_TRACT
  Filled 2022-03-04: qty 9

## 2022-03-04 MED ORDER — IPRATROPIUM-ALBUTEROL 0.5-2.5 (3) MG/3ML IN SOLN
RESPIRATORY_TRACT | Status: AC
  Administered 2022-03-04: 3 mL
  Filled 2022-03-04: qty 3

## 2022-03-04 MED ORDER — IPRATROPIUM BROMIDE 0.02 % IN SOLN
0.5000 mg | Freq: Once | RESPIRATORY_TRACT | Status: AC
  Administered 2022-03-04: 0.5 mg via RESPIRATORY_TRACT
  Filled 2022-03-04: qty 2.5

## 2022-03-04 MED ORDER — SODIUM CHLORIDE 0.9 % IV SOLN: INTRAVENOUS | Status: DC

## 2022-03-04 MED ORDER — ALBUTEROL SULFATE (5 MG/ML) 0.5% IN NEBU: 2.5000 mg | INHALATION_SOLUTION | Freq: Four times a day (QID) | RESPIRATORY_TRACT | 1 refills | Status: AC | PRN

## 2022-03-04 MED ORDER — ALBUTEROL SULFATE (2.5 MG/3ML) 0.083% IN NEBU
INHALATION_SOLUTION | RESPIRATORY_TRACT | Status: AC
  Administered 2022-03-04: 10 mg
  Filled 2022-03-04: qty 12

## 2022-03-04 MED ORDER — METHYLPREDNISOLONE SODIUM SUCC 125 MG IJ SOLR
125.0000 mg | Freq: Once | INTRAMUSCULAR | Status: AC
  Administered 2022-03-04: 125 mg via INTRAVENOUS
  Filled 2022-03-04: qty 2

## 2022-03-04 MED ORDER — ALBUTEROL SULFATE (2.5 MG/3ML) 0.083% IN NEBU
10.0000 mg | INHALATION_SOLUTION | Freq: Once | RESPIRATORY_TRACT | Status: DC
  Filled 2022-03-04: qty 12

## 2022-03-04 MED ORDER — ALBUTEROL SULFATE HFA 108 (90 BASE) MCG/ACT IN AERS
INHALATION_SPRAY | RESPIRATORY_TRACT | Status: AC
  Filled 2022-03-04: qty 6.7

## 2022-03-04 MED ORDER — MAGNESIUM SULFATE 2 GM/50ML IV SOLN
2.0000 g | Freq: Once | INTRAVENOUS | Status: AC
  Administered 2022-03-04: 2 g via INTRAVENOUS
  Filled 2022-03-04: qty 50

## 2022-03-04 MED ORDER — IPRATROPIUM-ALBUTEROL 0.5-2.5 (3) MG/3ML IN SOLN: 3.0000 mL | RESPIRATORY_TRACT | Status: DC

## 2022-03-04 MED ORDER — PREDNISONE 10 MG PO TABS: 50.0000 mg | ORAL_TABLET | Freq: Every day | ORAL | 0 refills | Status: DC

## 2022-03-04 MED ORDER — ALBUTEROL SULFATE HFA 108 (90 BASE) MCG/ACT IN AERS: 2.0000 | INHALATION_SPRAY | RESPIRATORY_TRACT | Status: DC | PRN

## 2022-03-04 NOTE — ED Notes (Signed)
Discharge paperwork given and verbally understood. 

## 2022-03-04 NOTE — ED Notes (Signed)
Pt appeared upon discharge to no longer have SOB or any other aliment.

## 2022-03-04 NOTE — ED Notes (Signed)
Patient ambulated to restroom at this time. Patient states "I feel a little better"

## 2022-03-04 NOTE — ED Notes (Addendum)
Peak flow obtained. Best attempt of 3 = 220

## 2022-03-04 NOTE — Progress Notes (Signed)
Transferring facility: [facility] Requesting provider: Dr. Eudelia Bunch (EDP at [facility]) Reason for transfer: admission for further evaluation and management of acute asthma exacerbation.   31 year old female with medical history notable for moderate persistent asthma, who presented to [facility] ED complaining of 2 days of shortness of breath associated with wheezing, with progression of these symptoms, in spite of increased use of rescue inhaler at home.  No fever or chills.  No chest pain.   Vital signs in the ED were notable for the following: Oxygen saturation is in the mid to high 90s on room air.  Afebrile.  Labs were notable for COVID-19/influenza PCR negative.  Imaging notable for chest x-ray, which showed no evidence of acute cardiopulmonary process.  After receiving nebulizer treatments, Solu-Medrol, IV magnesium, the patient's breathing has improved slightly, although she is still short of breath, wheezing, and demonstrating evidence of increased work of breathing.    Subsequently, I accepted this patient for transfer for inpatient admission to a med telemetry bed at [facility] or [facility] (first available) for further work-up and management of acute asthma exacerbation.        Check [email or web address] for on-call coverage.   Nursing staff, Please call [facility] Admits & Consults System-Wide number on Amion as soon as patient's arrival, so appropriate admitting provider can evaluate the pt.     Newton Pigg, DO Hospitalist

## 2022-03-04 NOTE — ED Triage Notes (Signed)
Sob, history of "really bad asthma" Took home neb treatments x 3. Feeling dizzy , and pain in chest from coughing, congesting. Started Monday and got progressively worse

## 2022-03-04 NOTE — Discharge Instructions (Signed)
We saw you in the ER for your asthma related complains. We gave you some breathing treatments in the ER, and seems like your symptoms have improved. Please take albuterol as needed every 4 hours. Please take the medications prescribed. Please refrain from smoking or smoke exposure. Please see a primary care doctor in 1 week. Return to the ER if your symptoms worsen.  

## 2022-03-04 NOTE — ED Notes (Signed)
Following breathing treatment, peak flow obtained. Best attempt = 300.

## 2022-03-04 NOTE — ED Notes (Signed)
Pt requested to leave and to not be admitted to the hospital because she was feeling better.

## 2022-03-04 NOTE — ED Provider Notes (Signed)
[facility] EMERGENCY DEPT Provider Note  CSN: [id number] Arrival date & time: 03/04/22 0136  Chief Complaint(s) Shortness of Breath  HPI Shannon Lopez is a 30 y.o. female    The history is provided by the patient.  Shortness of Breath Severity:  Moderate Onset quality:  Gradual Duration:  2 days Timing:  Constant Progression:  Worsening Chronicity:  New Relieved by:  Nothing Worsened by:  Deep breathing and coughing Ineffective treatments: inhalers and nebs. Associated symptoms: cough, sputum production and wheezing   Associated symptoms: no chest pain, no fever, no headaches and no hemoptysis   Patient reports that she has taken approximately 20 puffs of her albuterol inhaler throughout the day and took 3 nebulizer treatments at once this evening.   Past Medical History Past Medical History:  Diagnosis Date   Allergy    Asthma    Depression 06/18/2015   Morbid obesity with BMI of 40.0-44.9, adult [facility])    Patient Active Problem List   Diagnosis Date Noted   Polycystic ovaries 02/18/2017   Asthma 05/20/2014   Home Medication(s) Prior to Admission medications   Medication Sig Start Date End Date Taking? Authorizing Provider  acetaminophen (TYLENOL) 500 MG tablet Take 500 mg by mouth every 6 (six) hours as needed for mild pain or headache.    [provider]  albuterol (PROVENTIL HFA;VENTOLIN HFA) 108 (90 Base) MCG/ACT inhaler Inhale 2 puffs into the lungs every 4 (four) hours as needed for wheezing or shortness of breath. 01/04/17 10/18/18  Loletta Specter, PA-C  beclomethasone (QVAR) 80 MCG/ACT inhaler Inhale 2 puffs into the lungs daily.    [provider]  benzonatate (TESSALON) 100 MG capsule Take 1 capsule (100 mg total) by mouth 3 (three) times daily. 11/14/18   Ward, Tylene Fantasia, PA-C  ibuprofen (ADVIL) 800 MG tablet Take 1 tablet (800 mg total) by mouth 3 (three) times daily. 01/05/19   Wieters, Hallie C, PA-C  metFORMIN (GLUCOPHAGE)  500 MG tablet Take 1 tablet (500 mg total) by mouth 3 (three) times daily as needed. Patient not taking: Reported on 03/11/2018 01/14/17   Loletta Specter, PA-C  montelukast (SINGULAIR) 10 MG tablet Take 1 tablet (10 mg total) by mouth at bedtime. 04/19/15   Porfirio Oar, PA  Multiple Vitamin (MULTIVITAMIN WITH MINERALS) TABS tablet Take 1 tablet by mouth daily.    [provider]  ondansetron (ZOFRAN ODT) 4 MG disintegrating tablet Take 1 tablet (4 mg total) by mouth every 8 (eight) hours as needed for nausea or vomiting. Patient not taking: Reported on 10/18/2018 09/21/18   Antony Madura, PA-C  promethazine-dextromethorphan (PROMETHAZINE-DM) 6.25-15 MG/5ML syrup Take 5 mLs by mouth 4 (four) times daily as needed for cough. 11/14/18   Ward, Tylene Fantasia, PA-C  Allergies Latex and Percocet [oxycodone-acetaminophen]  Review of Systems Review of Systems  Constitutional:  Negative for fever.  Respiratory:  Positive for cough, sputum production, shortness of breath and wheezing. Negative for hemoptysis.   Cardiovascular:  Negative for chest pain.  Neurological:  Negative for headaches.   As noted in HPI  Physical Exam Vital Signs  I have reviewed the triage vital signs BP (!) 141/78   Pulse 81   Temp 98.4 F (36.9 C) (Oral)   Resp (!) 23   LMP 02/16/2022   SpO2 96%   Physical Exam Vitals reviewed.  Constitutional:      General: She is not in acute distress.    Appearance: She is well-developed. She is obese. She is not diaphoretic.  HENT:     Head: Normocephalic and atraumatic.     Nose: Nose normal.  Eyes:     General: No scleral icterus.       Right eye: No discharge.        Left eye: No discharge.     Conjunctiva/sclera: Conjunctivae normal.     Pupils: Pupils are equal, round, and reactive to light.  Cardiovascular:     Rate and Rhythm:  Normal rate and regular rhythm.     Heart sounds: No murmur heard.    No friction rub. No gallop.  Pulmonary:     Effort: Tachypnea, prolonged expiration and respiratory distress present.     Breath sounds: No stridor. Examination of the right-upper field reveals wheezing. Examination of the left-upper field reveals wheezing. Examination of the right-middle field reveals wheezing. Examination of the left-middle field reveals wheezing. Examination of the right-lower field reveals wheezing. Examination of the left-lower field reveals wheezing. Wheezing (coarse) and rhonchi present. No rales.  Abdominal:     General: There is no distension.     Palpations: Abdomen is soft.     Tenderness: There is no abdominal tenderness.  Musculoskeletal:        General: No tenderness.     Cervical back: Normal range of motion and neck supple.  Skin:    General: Skin is warm and dry.     Findings: No erythema or rash.  Neurological:     Mental Status: She is alert and oriented to person, place, and time.     ED Results and Treatments Labs (all labs ordered are listed, but only abnormal results are displayed) Labs Reviewed  BASIC METABOLIC PANEL - Abnormal; Notable for the following components:      Result Value   Sodium 134 (*)    Glucose, Bld 119 (*)    All other components within normal limits  RESP PANEL BY RT-PCR (FLU A&B, COVID) ARPGX2  CBC WITH DIFFERENTIAL/PLATELET                                                                                                                         EKG  EKG Interpretation  Date/Time:  Wednesday March 04 2022 01:42:56 EDT Ventricular  Rate:  96 PR Interval:  142 QRS Duration: 84 QT Interval:  344 QTC Calculation: 434 R Axis:   45 Text Interpretation: Normal sinus rhythm with sinus arrhythmia Nonspecific T wave abnormality Abnormal ECG No significant change was found When compared with ECG of 07-Apr-2021 20:47, PREVIOUS ECG IS PRESENT Confirmed by  Addison Lank [phone]) on 03/04/2022 1:56:09 AM       Radiology DG Chest 2 View  Result Date: 03/04/2022 CLINICAL DATA:  Shortness of breath EXAM: CHEST - 2 VIEW COMPARISON:  10/17/2018 FINDINGS: Cardiac shadow is within normal limits. The lungs are clear. Extrinsic artifact from the patient's hair is noted. No bony abnormality is noted. IMPRESSION: No active cardiopulmonary disease. Electronically Signed   By: Inez Catalina M.D.   On: 03/04/2022 02:12    Medications Ordered in ED Medications  0.9 %  sodium chloride infusion ( Intravenous New Bag/Given 03/04/22 0218)  albuterol (PROVENTIL) (2.5 MG/3ML) 0.083% nebulizer solution 10 mg (0 mg/hr Nebulization Hold 03/04/22 0215)  magnesium sulfate IVPB 2 g 50 mL (has no administration in time range)  ipratropium (ATROVENT) nebulizer solution 0.5 mg (0.5 mg Nebulization Given 03/04/22 0214)  methylPREDNISolone sodium succinate (SOLU-MEDROL) 125 mg/2 mL injection 125 mg (125 mg Intravenous Given 03/04/22 0219)  albuterol (PROVENTIL) (2.5 MG/3ML) 0.083% nebulizer solution (10 mg  Given 03/04/22 0215)  ipratropium-albuterol (DUONEB) 0.5-2.5 (3) MG/3ML nebulizer solution 3 mL (3 mLs Nebulization Given 03/04/22 0446)                                                                                                                                     Procedures .1-3 Lead EKG Interpretation  Performed by: Fatima Blank, MD Authorized by: Fatima Blank, MD     Interpretation: normal     ECG rate:  93   ECG rate assessment: normal     Rhythm: sinus rhythm     Ectopy: none     Conduction: normal   .Critical Care  Performed by: Fatima Blank, MD Authorized by: Fatima Blank, MD   Critical care provider statement:    Critical care time (minutes):  45   Critical care time was exclusive of:  Separately billable procedures and treating other patients   Critical care was necessary to treat or prevent imminent or  life-threatening deterioration of the following conditions:  Respiratory failure   Critical care was time spent personally by me on the following activities:  Development of treatment plan with patient or surrogate, discussions with consultants, evaluation of patient's response to treatment, examination of patient, obtaining history from patient or surrogate, review of old charts, re-evaluation of patient's condition, pulse oximetry, ordering and review of radiographic studies, ordering and review of laboratory studies and ordering and performing treatments and interventions   Care discussed with: admitting provider     (including critical care time)  Medical Decision Making / ED Course   Medical Decision  Making Amount and/or Complexity of Data Reviewed Labs: ordered. Decision-making details documented in ED Course. Radiology: ordered and independent interpretation performed. Decision-making details documented in ED Course. ECG/medicine tests: ordered and independent interpretation performed. Decision-making details documented in ED Course.  Risk Prescription drug management. Drug therapy requiring intensive monitoring for toxicity. Decision regarding hospitalization.    Patient presents with asthma exacerbation. CBC without leukocytosis or anemia Metabolic panel without significant electrolyte derangement or renal sufficiency COVID/influenza negative Chest x-ray without evidence of pneumonia, pneumothorax, pulmonary edema, pleural effusion EKG without acute ischemic changes, dysrhythmias or blocks.  Patient treated with continuous nebulizer and Atrovent.  She was given IV Solu-Medrol.  After completion and on reevaluation patient's work of breathing improved however she continues to have wheezing throughout.  She will require additional breathing treatments and magnesium.  We will discuss case with Dr. Velia Meyer from hospitalist service for admission       Final Clinical  Impression(s) / ED Diagnoses Final diagnoses:  Moderate persistent asthma with exacerbation           This chart was dictated using voice recognition software.  Despite best efforts to proofread,  errors can occur which can change the documentation meaning.    Fatima Blank, MD 03/04/22 [phone]

## 2022-03-04 NOTE — ED Notes (Signed)
Pt complained of feeling "wheeze and wanted a breathing treatment". Respiratory Therapist notified and would evaluate.

## 2022-03-04 NOTE — ED Notes (Addendum)
Story from PT -- Monday started to have SOB, hx of asthma, tried multi meds that she is prescribed throughout the day. No relief. Currently no SOB or pain, currently sleeping (woke up to talk to her).

## 2022-03-04 NOTE — Progress Notes (Signed)
RT assessed the Pt and she stated that she felt that she could maintain her asthma at home. RT listened to the Pt and she had some wheezes but had good air through her lungs, RT informed the doctor that the Pt wanted to go home. RT will continue to monitor

## 2022-03-04 NOTE — ED Provider Notes (Signed)
  Physical Exam  BP 139/66   Pulse (!) 101   Temp 98.2 F (36.8 C) (Oral)   Resp 20   LMP 02/16/2022   SpO2 98%   Physical Exam  Procedures  .Critical Care  Performed by: Derwood Kaplan, MD Authorized by: Derwood Kaplan, MD   Critical care provider statement:    Critical care time (minutes):  10   Critical care was time spent personally by me on the following activities:  Development of treatment plan with patient or surrogate, discussions with consultants, evaluation of patient's response to treatment, examination of patient, ordering and review of laboratory studies, ordering and review of radiographic studies, ordering and performing treatments and interventions, pulse oximetry, re-evaluation of patient's condition and review of old charts   ED Course / MDM    Medical Decision Making Amount and/or Complexity of Data Reviewed Labs: ordered. Radiology: ordered.  Risk Prescription drug management.    Patient is being admitted for asthma exacerbation.  She had received multiple nebulizer treatment for her wheezing.  COVID-19, flu test are negative.  Patient is not hypoxic.  Around 1:00, patient requested if she can be discharged.  She is feeling better.  She has received multiple nebulizer treatment and thinks that she is about 85% better now.  I examined the patient.  She is having diffuse wheezing, worse with expiration.  She however has no respiratory distress, hypoxia, tachypnea.  She is able to complete full sentences without having to stop.  She states that when she came in, she was having difficulty with aeration and difficulty speaking.  She thinks that she is on her weight getting better, and would be comfortable going home now.  Patient understand that I did not see her when she first came in, therefore going by her judgment.  If she has any doubts about improving, then she should be staying in the hospital rather than going home and getting worse.  Patient  reiterated that she feels a lot comfortable and is comfortable going home.  Asthma action plan provided.  Prednisone prescription provided.  Patient will be given albuterol inhaler.  Nebulizer ordered as per her request.        Derwood Kaplan, MD 03/04/22 1316

## 2022-03-31 ENCOUNTER — Encounter (HOSPITAL_BASED_OUTPATIENT_CLINIC_OR_DEPARTMENT_OTHER): Payer: Self-pay | Admitting: *Deleted

## 2022-03-31 ENCOUNTER — Emergency Department (HOSPITAL_BASED_OUTPATIENT_CLINIC_OR_DEPARTMENT_OTHER)
Admission: EM | Admit: 2022-03-31 | Discharge: 2022-03-31 | Disposition: A | Discharge status: Home / Self Care | Payer: Medicaid Other | Attending: Emergency Medicine | Admitting: Emergency Medicine

## 2022-03-31 ENCOUNTER — Other Ambulatory Visit: Payer: Self-pay

## 2022-03-31 ENCOUNTER — Emergency Department (HOSPITAL_BASED_OUTPATIENT_CLINIC_OR_DEPARTMENT_OTHER): Payer: Medicaid Other

## 2022-03-31 DIAGNOSIS — R1032 Left lower quadrant pain: Secondary | ICD-10-CM | POA: Diagnosis not present

## 2022-03-31 DIAGNOSIS — O26891 Other specified pregnancy related conditions, first trimester: Secondary | ICD-10-CM | POA: Insufficient documentation

## 2022-03-31 DIAGNOSIS — Z91040 Latex allergy status: Secondary | ICD-10-CM | POA: Insufficient documentation

## 2022-03-31 DIAGNOSIS — R1013 Epigastric pain: Secondary | ICD-10-CM | POA: Insufficient documentation

## 2022-03-31 DIAGNOSIS — R1031 Right lower quadrant pain: Secondary | ICD-10-CM | POA: Insufficient documentation

## 2022-03-31 DIAGNOSIS — Z3A01 Less than 8 weeks gestation of pregnancy: Secondary | ICD-10-CM | POA: Insufficient documentation

## 2022-03-31 DIAGNOSIS — Z7984 Long term (current) use of oral hypoglycemic drugs: Secondary | ICD-10-CM | POA: Diagnosis not present

## 2022-03-31 DIAGNOSIS — R1012 Left upper quadrant pain: Secondary | ICD-10-CM | POA: Insufficient documentation

## 2022-03-31 LAB — COMPREHENSIVE METABOLIC PANEL
ALT: 12 U/L (ref 0–44)
AST: 10 U/L — ABNORMAL LOW (ref 15–41)
Albumin: 4.1 g/dL (ref 3.5–5.0)
Alkaline Phosphatase: 70 U/L (ref 38–126)
Anion gap: 7 (ref 5–15)
BUN: 13 mg/dL (ref 6–20)
CO2: 24 mmol/L (ref 22–32)
Calcium: 9.4 mg/dL (ref 8.9–10.3)
Chloride: 106 mmol/L (ref 98–111)
Creatinine, Ser: 0.75 mg/dL (ref 0.44–1.00)
GFR, Estimated: >60 mL/min (ref >60)
Glucose, Bld: 97 mg/dL (ref 70–99)
Potassium: 3.8 mmol/L (ref 3.5–5.1)
Sodium: 137 mmol/L (ref 135–145)
Total Bilirubin: 0.4 mg/dL (ref 0.3–1.2)
Total Protein: 7.1 g/dL (ref 6.5–8.1)

## 2022-03-31 LAB — URINALYSIS, ROUTINE W REFLEX MICROSCOPIC
Bilirubin Urine: NEGATIVE
Glucose, UA: NEGATIVE mg/dL
Hgb urine dipstick: NEGATIVE
Ketones, ur: NEGATIVE mg/dL
Leukocytes,Ua: NEGATIVE
Nitrite: NEGATIVE
Protein, ur: NEGATIVE mg/dL
Specific Gravity, Urine: 1.022 (ref 1.005–1.030)
pH: 5.5 (ref 5.0–8.0)

## 2022-03-31 LAB — LIPASE, BLOOD: Lipase: 15 U/L (ref 11–51)

## 2022-03-31 LAB — CBC
HCT: 42 % (ref 36.0–46.0)
Hemoglobin: 14.1 g/dL (ref 12.0–15.0)
MCH: 27.9 pg (ref 26.0–34.0)
MCHC: 33.6 g/dL (ref 30.0–36.0)
MCV: 83 fL (ref 80.0–100.0)
Platelets: 307 10*3/uL (ref 150–400)
RBC: 5.06 MIL/uL (ref 3.87–5.11)
RDW: 14.6 % (ref 11.5–15.5)
WBC: 6.5 10*3/uL (ref 4.0–10.5)
nRBC: 0 % (ref 0.0–0.2)

## 2022-03-31 LAB — PREGNANCY, URINE: Preg Test, Ur: POSITIVE — AB

## 2022-03-31 LAB — HCG, QUANTITATIVE, PREGNANCY: hCG, Beta Chain, Quant, S: 1897 m[IU]/mL — ABNORMAL HIGH (ref <5)

## 2022-03-31 MED ORDER — ACETAMINOPHEN 325 MG PO TABS
650.0000 mg | ORAL_TABLET | ORAL | Status: DC
  Filled 2022-03-31: qty 2

## 2022-03-31 NOTE — ED Triage Notes (Addendum)
Pt is here as she is worried about having a miscarriage.  Pt reports that her LMP was 7/31 and she found out she was pregnant a week ago by home pregnancy test.  Pt is reporting that she has some abdominal pain which feels like "cramping and stabbing".  Denies any vaginal bleeding. Pt has had 5 pregnancies and 3 miscarriages (2 before 12 weeks and one at 14 weeks) and one elective abortion. No living children. No prenatal care yet.

## 2022-03-31 NOTE — Discharge Instructions (Signed)
Today you were seen in the emergency department for your abdominal pain.    In the emergency department you had an ultrasound of your gallbladder and an ultrasound of your uterus which were both reassuring.    At home, please take Tylenol as needed for your abdominal pain.    Check your MyChart online for the results of any tests that had not resulted by the time you left the emergency department.   Follow-up with your OBGYN doctor in 2-3 days regarding your visit.    Return immediately to the emergency department if you experience any of the following: Worsening abdominal pain, vomiting, cramping, vaginal discharge or bleeding, or any other concerning symptoms.    Thank you for visiting our Emergency Department. It was a pleasure taking care of you today.

## 2022-03-31 NOTE — ED Provider Notes (Signed)
[facility] EMERGENCY DEPT Provider Note   CSN: [id number] Arrival date & time: 03/31/22  1255     History {Add pertinent medical, surgical, social history, OB history to HPI:1} Chief Complaint  Patient presents with   Threatened Miscarriage    Shannon Lopez is a 31 y.o. female.  31 year old female G5P0 at approximately 6 weeks and 1 day by LMP with a history of several miscarriages who presents emergency department with abdominal pain.  Patient reports that she has had 3 days of sharp epigastric abdominal pain lasting approximately 5 minutes at a time.  No association with eating.  Denies any known history of gallstones.  Says that she also had bilateral lower abdominal pain that felt like prior episodes of round ligament pain but denies any vaginal discharge or bleeding.  Denies any cramping.  No urinary frequency or urgency.  Says that she was concerned about a miscarriage so decided to come into the emergency department for evaluation.  No fevers.  Follows with OB/GYN at [facility]).         Home Medications Prior to Admission medications   Medication Sig Start Date End Date Taking? Authorizing Provider  acetaminophen (TYLENOL) 500 MG tablet Take 500 mg by mouth every 6 (six) hours as needed for mild pain or headache.    [provider]  albuterol (PROVENTIL) (5 MG/ML) 0.5% nebulizer solution Take 0.5 mLs (2.5 mg total) by nebulization every 6 (six) hours as needed for wheezing or shortness of breath. 03/04/22   Derwood Kaplan, MD  beclomethasone (QVAR) 80 MCG/ACT inhaler Inhale 2 puffs into the lungs daily.    [provider]  benzonatate (TESSALON) 100 MG capsule Take 1 capsule (100 mg total) by mouth 3 (three) times daily. Patient not taking: Reported on 03/04/2022 11/14/18   Ward, Tylene Fantasia, PA-C  ibuprofen (ADVIL) 800 MG tablet Take 1 tablet (800 mg total) by mouth 3 (three) times daily. Patient not taking: Reported on  03/04/2022 01/05/19   Wieters, Hallie C, PA-C  metFORMIN (GLUCOPHAGE) 500 MG tablet Take 1 tablet (500 mg total) by mouth 3 (three) times daily as needed. Patient not taking: Reported on 03/11/2018 01/14/17   Loletta Specter, PA-C  montelukast (SINGULAIR) 10 MG tablet Take 1 tablet (10 mg total) by mouth at bedtime. Patient not taking: Reported on 03/04/2022 04/19/15   Porfirio Oar, PA  ondansetron (ZOFRAN ODT) 4 MG disintegrating tablet Take 1 tablet (4 mg total) by mouth every 8 (eight) hours as needed for nausea or vomiting. Patient not taking: Reported on 10/18/2018 09/21/18   Antony Madura, PA-C  predniSONE (DELTASONE) 10 MG tablet Take 5 tablets (50 mg total) by mouth daily. 03/04/22   Derwood Kaplan, MD  promethazine-dextromethorphan (PROMETHAZINE-DM) 6.25-15 MG/5ML syrup Take 5 mLs by mouth 4 (four) times daily as needed for cough. Patient not taking: Reported on 03/04/2022 11/14/18   Ward, Tylene Fantasia, PA-C      Allergies    Latex and Percocet [oxycodone-acetaminophen]    Review of Systems   Review of Systems  Physical Exam Updated Vital Signs BP 132/62 (BP Location: Right Arm)   Pulse 60   Temp (!) 97.4 F (36.3 C)   Resp 18   Ht 5\' 5"  (1.651 m)   Wt 106.6 kg   LMP 02/16/2022   SpO2 98%   BMI 39.11 kg/m  Physical Exam Vitals and nursing note reviewed.  Constitutional:      General: She is not in acute distress.  Appearance: She is well-developed.  HENT:     Head: Normocephalic and atraumatic.     Right Ear: External ear normal.     Left Ear: External ear normal.     Nose: Nose normal.  Eyes:     Extraocular Movements: Extraocular movements intact.     Conjunctiva/sclera: Conjunctivae normal.     Pupils: Pupils are equal, round, and reactive to light.  Cardiovascular:     Rate and Rhythm: Normal rate and regular rhythm.     Heart sounds: No murmur heard. Pulmonary:     Effort: Pulmonary effort is normal. No respiratory distress.     Breath sounds: Normal breath  sounds.  Abdominal:     General: Abdomen is flat. There is no distension.     Palpations: Abdomen is soft. There is no mass.     Tenderness: There is abdominal tenderness (Epigastrium and left upper quadrant). There is no guarding.  Musculoskeletal:        General: No swelling.     Cervical back: Normal range of motion and neck supple.     Right lower leg: No edema.     Left lower leg: No edema.  Skin:    General: Skin is warm and dry.     Capillary Refill: Capillary refill takes less than 2 seconds.  Neurological:     Mental Status: She is alert and oriented to person, place, and time. Mental status is at baseline.  Psychiatric:        Mood and Affect: Mood normal.     ED Results / Procedures / Treatments   Labs (all labs ordered are listed, but only abnormal results are displayed) Labs Reviewed  PREGNANCY, URINE - Abnormal; Notable for the following components:      Result Value   Preg Test, Ur POSITIVE (*)    All other components within normal limits  COMPREHENSIVE METABOLIC PANEL - Abnormal; Notable for the following components:   AST 10 (*)    All other components within normal limits  HCG, QUANTITATIVE, PREGNANCY - Abnormal; Notable for the following components:   hCG, Beta Chain, Quant, S 1,897 (*)    All other components within normal limits  LIPASE, BLOOD  CBC  URINALYSIS, ROUTINE W REFLEX MICROSCOPIC    EKG None  Radiology No results found.  Procedures Procedures  {Document cardiac monitor, telemetry assessment procedure when appropriate:1}  Medications Ordered in ED Medications - No data to display  ED Course/ Medical Decision Making/ A&P                           Medical Decision Making Amount and/or Complexity of Data Reviewed Labs: ordered. Radiology: ordered.  Risk OTC drugs.   Shannon Lopez is a 31 y.o. female G5, P0 at 6 weeks and 1 day by LMP who presents with chief complaint of abdominal pain.  Initial Ddx:  Gastritis,  cholecystitis/biliary colic, miscarriage ***  MDM:  ***  Plan:  ***  ED Summary:  ***  Dispo: {Disposition:28069}   Additional history obtained from {Additional History:28067} Records reviewed {Records Reviewed:28068} The following labs were independently interpreted: {labs interpreted:28064} I independently visualized the following imaging with scope of interpretation limited to determining acute life threatening conditions related to emergency care: {imaging interpreted:28065}, which revealed {No acute abnormality:28066}  Consults: {Consultants:28063} Social Determinants of health:  ***  {Document critical care time when appropriate:1} {Document POCUS if Performed:1}   {Document critical care time when appropriate:1} {  Document review of labs and clinical decision tools ie heart score, Chads2Vasc2 etc:1}  {Document your independent review of radiology images, and any outside records:1} {Document your discussion with family members, caretakers, and with consultants:1} {Document social determinants of health affecting pt's care:1} {Document your decision making why or why not admission, treatments were needed:1} Final Clinical Impression(s) / ED Diagnoses Final diagnoses:  None    Rx / DC Orders ED Discharge Orders     None

## 2022-04-17 ENCOUNTER — Other Ambulatory Visit: Payer: Self-pay

## 2022-04-17 ENCOUNTER — Emergency Department (HOSPITAL_BASED_OUTPATIENT_CLINIC_OR_DEPARTMENT_OTHER): Payer: Medicaid Other

## 2022-04-17 ENCOUNTER — Emergency Department (HOSPITAL_BASED_OUTPATIENT_CLINIC_OR_DEPARTMENT_OTHER)
Admission: EM | Admit: 2022-04-17 | Discharge: 2022-04-17 | Discharge status: Against Medical Advice | Payer: Medicaid Other | Attending: Emergency Medicine | Admitting: Emergency Medicine

## 2022-04-17 ENCOUNTER — Encounter (HOSPITAL_BASED_OUTPATIENT_CLINIC_OR_DEPARTMENT_OTHER): Payer: Self-pay | Admitting: *Deleted

## 2022-04-17 DIAGNOSIS — R109 Unspecified abdominal pain: Secondary | ICD-10-CM | POA: Insufficient documentation

## 2022-04-17 DIAGNOSIS — Z5321 Procedure and treatment not carried out due to patient leaving prior to being seen by health care provider: Secondary | ICD-10-CM | POA: Diagnosis not present

## 2022-04-17 DIAGNOSIS — Z3A08 8 weeks gestation of pregnancy: Secondary | ICD-10-CM | POA: Diagnosis not present

## 2022-04-17 DIAGNOSIS — O26891 Other specified pregnancy related conditions, first trimester: Secondary | ICD-10-CM | POA: Diagnosis not present

## 2022-04-17 NOTE — ED Notes (Signed)
Called pt in waiting area, no response.  Called her on her cell x2 no answer

## 2022-04-17 NOTE — ED Notes (Signed)
Called pt in waiting area, no answer.  I can not visualize pt in waiting area.  Called pt on cell phone, no answer [phone]
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
## 2022-04-24 ENCOUNTER — Encounter (HOSPITAL_BASED_OUTPATIENT_CLINIC_OR_DEPARTMENT_OTHER): Payer: Self-pay

## 2022-04-24 ENCOUNTER — Other Ambulatory Visit: Payer: Self-pay

## 2022-04-24 ENCOUNTER — Emergency Department (HOSPITAL_BASED_OUTPATIENT_CLINIC_OR_DEPARTMENT_OTHER): Payer: Medicaid Other

## 2022-04-24 ENCOUNTER — Other Ambulatory Visit (HOSPITAL_BASED_OUTPATIENT_CLINIC_OR_DEPARTMENT_OTHER): Payer: Self-pay

## 2022-04-24 ENCOUNTER — Emergency Department (HOSPITAL_BASED_OUTPATIENT_CLINIC_OR_DEPARTMENT_OTHER)
Admission: EM | Admit: 2022-04-24 | Discharge: 2022-04-24 | Disposition: A | Discharge status: Home / Self Care | Payer: Medicaid Other | Attending: Emergency Medicine | Admitting: Emergency Medicine

## 2022-04-24 DIAGNOSIS — O4691 Antepartum hemorrhage, unspecified, first trimester: Secondary | ICD-10-CM | POA: Diagnosis not present

## 2022-04-24 DIAGNOSIS — O418X10 Other specified disorders of amniotic fluid and membranes, first trimester, not applicable or unspecified: Secondary | ICD-10-CM

## 2022-04-24 DIAGNOSIS — Z79899 Other long term (current) drug therapy: Secondary | ICD-10-CM | POA: Insufficient documentation

## 2022-04-24 DIAGNOSIS — O209 Hemorrhage in early pregnancy, unspecified: Secondary | ICD-10-CM | POA: Diagnosis present

## 2022-04-24 LAB — ABO/RH: ABO/RH(D): O POS

## 2022-04-24 LAB — HCG, QUANTITATIVE, PREGNANCY: hCG, Beta Chain, Quant, S: 74265 m[IU]/mL — ABNORMAL HIGH (ref <5)

## 2022-04-24 MED ORDER — ACETAMINOPHEN 325 MG PO TABS: 650.0000 mg | ORAL_TABLET | Freq: Once | ORAL | Status: DC

## 2022-04-24 NOTE — ED Notes (Signed)
Patient verbalizes understanding of discharge instructions. Opportunity for questioning and answers were provided. Patient discharged from ED.  °

## 2022-04-24 NOTE — ED Provider Notes (Signed)
Shannon Lopez   CSN: [id number] Arrival date & time: 04/24/22  1022     History  Chief Complaint  Patient presents with   Vaginal Bleeding    Shannon Lopez is a 31 y.o. female.  Patient complains of vaginal bleeding starting today.  Patient reports there was blood on the tissue when she wiped.  Patient reports this is similar to when she normally starts her period.  Complains of lower abdominal cramping and discomfort.  Patient reports she is early pregnant.  The history is provided by the patient. No language interpreter was used.  Vaginal Bleeding Quality:  Bright red Severity:  Moderate Onset quality:  Gradual Duration:  1 day Timing:  Constant Progression:  Worsening Chronicity:  New Relieved by:  Nothing Worsened by:  Nothing Ineffective treatments:  None tried      Home Medications Prior to Admission medications   Medication Sig Start Date End Date Taking? Authorizing Provider  acetaminophen (TYLENOL) 500 MG tablet Take 500 mg by mouth every 6 (six) hours as needed for mild pain or headache.    [provider]  albuterol (PROVENTIL) (5 MG/ML) 0.5% nebulizer solution Take 0.5 mLs (2.5 mg total) by nebulization every 6 (six) hours as needed for wheezing or shortness of breath. 03/04/22   Varney Biles, MD  beclomethasone (QVAR) 80 MCG/ACT inhaler Inhale 2 puffs into the lungs daily.    [provider]  benzonatate (TESSALON) 100 MG capsule Take 1 capsule (100 mg total) by mouth 3 (three) times daily. Patient not taking: Reported on 03/04/2022 11/14/18   Ward, Lenise Arena, PA-C  ibuprofen (ADVIL) 800 MG tablet Take 1 tablet (800 mg total) by mouth 3 (three) times daily. Patient not taking: Reported on 03/04/2022 01/05/19   Wieters, Hallie C, PA-C  metFORMIN (GLUCOPHAGE) 500 MG tablet Take 1 tablet (500 mg total) by mouth 3 (three) times daily as needed. Patient not taking: Reported on 03/11/2018 01/14/17   Clent Demark, PA-C  montelukast (SINGULAIR) 10 MG tablet Take 1 tablet (10 mg total) by mouth at bedtime. Patient not taking: Reported on 03/04/2022 04/19/15   Harrison Mons, PA  ondansetron (ZOFRAN ODT) 4 MG disintegrating tablet Take 1 tablet (4 mg total) by mouth every 8 (eight) hours as needed for nausea or vomiting. Patient not taking: Reported on 10/18/2018 09/21/18   Antonietta Breach, PA-C  predniSONE (DELTASONE) 10 MG tablet Take 5 tablets (50 mg total) by mouth daily. 03/04/22   Varney Biles, MD  promethazine-dextromethorphan (PROMETHAZINE-DM) 6.25-15 MG/5ML syrup Take 5 mLs by mouth 4 (four) times daily as needed for cough. Patient not taking: Reported on 03/04/2022 11/14/18   Ward, Lenise Arena, PA-C      Allergies    Latex and Oxycodone    Review of Systems   Review of Systems  Genitourinary:  Positive for vaginal bleeding.  All other systems reviewed and are negative.   Physical Exam Updated Vital Signs BP 123/77   Pulse (!) 56   Temp 97.9 F (36.6 C) (Oral)   Resp 15   Ht 5\' 5"  (1.651 m)   Wt 107 kg   LMP 02/16/2022   SpO2 100%   BMI 39.27 kg/m  Physical Exam Vitals and nursing Lopez reviewed.  Constitutional:      Appearance: She is well-developed.  HENT:     Head: Normocephalic.  Cardiovascular:     Rate and Rhythm: Normal rate and regular rhythm.  Pulmonary:     Effort: Pulmonary  effort is normal.  Abdominal:     General: Abdomen is flat. There is no distension.  Musculoskeletal:        General: Normal range of motion.     Cervical back: Normal range of motion.  Skin:    General: Skin is warm.  Neurological:     General: No focal deficit present.     Mental Status: She is alert and oriented to person, place, and time.  Psychiatric:        Mood and Affect: Mood normal.     ED Results / Procedures / Treatments   Labs (all labs ordered are listed, but only abnormal results are displayed) Labs Reviewed  HCG, QUANTITATIVE, PREGNANCY - Abnormal; Notable for  the following components:      Result Value   hCG, Beta Chain, Quant, S 74,265 (*)    All other components within normal limits  ABO/RH    EKG None  Radiology US OB LESS THAN 14 WEEKS WITH OB TRANSVAGINAL  Result Date: 04/24/2022 CLINICAL DATA:  First trimester pregnancy. New onset of vaginal bleeding. LMP 02/16/2022. EXAM: OBSTETRIC <14 WK [place] AND TRANSVAGINAL OB US TECHNIQUE: Both transabdominal and transvaginal ultrasound examinations were performed for complete evaluation of the gestation as well as the maternal uterus, adnexal regions, and pelvic cul-de-sac. Transvaginal technique was performed to assess early pregnancy. COMPARISON:  Obstetric ultrasound 04/17/2022. FINDINGS: Intrauterine gestational sac: Visualized/normal in shape. Yolk sac:  Visualized. Embryo:  Visualized. Cardiac Activity: Visualized. Heart Rate: 158 bpm CRL:  18.6 mm; 8 w 3 d;                  [place] EDC: 12/01/2022 Subchorionic hemorrhage: There is a small subchorionic hematoma. Maternal uterus/adnexae: Nonvisualization of the right ovary. No suspicious adnexal findings. No adnexal mass or significant free pelvic fluid. IMPRESSION: 1. Single live intrauterine gestation demonstrating appropriate interval growth from recent prior ultrasound. 2. Small subchronic hematoma.  No adnexal abnormalities identified. Electronically Signed   By: Richardean Sale M.D.   On: 04/24/2022 14:00    Procedures Procedures    Medications Ordered in ED Medications - No data to display  ED Course/ Medical Decision Making/ A&P                           Medical Decision Making Patient complains of vaginal bleeding in early pregnancy  Amount and/or Complexity of Data Reviewed External Data Reviewed: notes.    Details: Previous notes from ED visit reviewed Labs: ordered. Decision-making details documented in ED Course.    Details: Patient's blood type is O+.  Quantitative hCG is 74,265 Radiology: ordered and independent interpretation  performed. Decision-making details documented in ED Course.    Details: Ultrasound shows single living intrauterine gestation small subchorionic hematoma.  Normal interval growth since previous ultrasound  Risk Risk Details: Patient counseled on subchorionic hemorrhage.  Patient is advised this can be associated with miscarriage.  She is advised to keep scheduled OB/GYN appointment she is advised to continue taking her prenatal vitamin Tylenol for discomfort she is advised pelvic rest no tampons no intercourse           Final Clinical Impression(s) / ED Diagnoses Final diagnoses:  Subchorionic hematoma in first trimester, single or unspecified fetus    Rx / DC Orders ED Discharge Orders     None      An After Visit Summary was printed and given to the patient.    [place],  Hollace Kinnier, PA-C 04/24/22 [place], [place], DO 04/24/22 1609

## 2022-04-24 NOTE — ED Triage Notes (Signed)
Pt states she is aprox 8-[redacted] weeks pregnant and is bleeding from her vagina. States she she is bleeding heavily . Started 30 minutes ago.

## 2022-07-09 LAB — OB RESULTS CONSOLE GBS: GBS: POSITIVE

## 2022-07-09 LAB — OB RESULTS CONSOLE HIV ANTIBODY (ROUTINE TESTING): HIV: NONREACTIVE

## 2022-07-09 LAB — OB RESULTS CONSOLE ANTIBODY SCREEN: Antibody Screen: NEGATIVE

## 2022-07-09 LAB — OB RESULTS CONSOLE HEPATITIS B SURFACE ANTIGEN: Hepatitis B Surface Ag: NEGATIVE

## 2022-07-09 LAB — OB RESULTS CONSOLE RPR: RPR: NONREACTIVE

## 2022-07-09 LAB — OB RESULTS CONSOLE RUBELLA ANTIBODY, IGM: Rubella: IMMUNE

## 2022-08-11 ENCOUNTER — Other Ambulatory Visit: Payer: Self-pay

## 2022-08-11 ENCOUNTER — Encounter (HOSPITAL_BASED_OUTPATIENT_CLINIC_OR_DEPARTMENT_OTHER): Payer: Self-pay | Admitting: Emergency Medicine

## 2022-08-11 ENCOUNTER — Emergency Department (HOSPITAL_BASED_OUTPATIENT_CLINIC_OR_DEPARTMENT_OTHER)
Admission: EM | Admit: 2022-08-11 | Discharge: 2022-08-11 | Disposition: A | Discharge status: Home / Self Care | Payer: Medicaid Other | Attending: Emergency Medicine | Admitting: Emergency Medicine

## 2022-08-11 ENCOUNTER — Emergency Department (HOSPITAL_BASED_OUTPATIENT_CLINIC_OR_DEPARTMENT_OTHER): Payer: Medicaid Other | Admitting: Radiology

## 2022-08-11 DIAGNOSIS — Z5321 Procedure and treatment not carried out due to patient leaving prior to being seen by health care provider: Secondary | ICD-10-CM | POA: Insufficient documentation

## 2022-08-11 DIAGNOSIS — Z3A24 24 weeks gestation of pregnancy: Secondary | ICD-10-CM | POA: Diagnosis not present

## 2022-08-11 DIAGNOSIS — O26892 Other specified pregnancy related conditions, second trimester: Secondary | ICD-10-CM | POA: Insufficient documentation

## 2022-08-11 NOTE — ED Notes (Signed)
Notified by registration that pt lwbs.

## 2022-08-11 NOTE — ED Triage Notes (Signed)
[redacted] weeks pregnant, at work door was opened and the handle went into left abdomen and left shoulder took the most force, this was Sunday. Has OB appointment tomorrow. Pt reports baby is moving, no concerns with baby.

## 2022-08-14 ENCOUNTER — Other Ambulatory Visit: Payer: Self-pay | Admitting: Obstetrics and Gynecology

## 2022-08-14 ENCOUNTER — Other Ambulatory Visit: Payer: Self-pay

## 2022-08-14 DIAGNOSIS — O99212 Obesity complicating pregnancy, second trimester: Secondary | ICD-10-CM

## 2022-08-21 ENCOUNTER — Inpatient Hospital Stay (HOSPITAL_BASED_OUTPATIENT_CLINIC_OR_DEPARTMENT_OTHER)
Admission: EM | Admit: 2022-08-21 | Discharge: 2022-08-22 | Disposition: A | Discharge status: Home / Self Care | Payer: Medicaid Other | Attending: Obstetrics & Gynecology | Admitting: Obstetrics & Gynecology

## 2022-08-21 ENCOUNTER — Encounter (HOSPITAL_BASED_OUTPATIENT_CLINIC_OR_DEPARTMENT_OTHER): Payer: Self-pay

## 2022-08-21 ENCOUNTER — Other Ambulatory Visit: Payer: Self-pay

## 2022-08-21 ENCOUNTER — Emergency Department (HOSPITAL_BASED_OUTPATIENT_CLINIC_OR_DEPARTMENT_OTHER): Payer: Medicaid Other

## 2022-08-21 DIAGNOSIS — O26892 Other specified pregnancy related conditions, second trimester: Secondary | ICD-10-CM | POA: Insufficient documentation

## 2022-08-21 DIAGNOSIS — Z3A25 25 weeks gestation of pregnancy: Secondary | ICD-10-CM | POA: Diagnosis not present

## 2022-08-21 DIAGNOSIS — Z3A26 26 weeks gestation of pregnancy: Secondary | ICD-10-CM | POA: Diagnosis not present

## 2022-08-21 DIAGNOSIS — Z3689 Encounter for other specified antenatal screening: Secondary | ICD-10-CM

## 2022-08-21 DIAGNOSIS — O99212 Obesity complicating pregnancy, second trimester: Secondary | ICD-10-CM | POA: Diagnosis not present

## 2022-08-21 DIAGNOSIS — O99352 Diseases of the nervous system complicating pregnancy, second trimester: Secondary | ICD-10-CM | POA: Diagnosis present

## 2022-08-21 DIAGNOSIS — R030 Elevated blood-pressure reading, without diagnosis of hypertension: Secondary | ICD-10-CM | POA: Insufficient documentation

## 2022-08-21 DIAGNOSIS — R519 Headache, unspecified: Secondary | ICD-10-CM | POA: Insufficient documentation

## 2022-08-21 DIAGNOSIS — O26899 Other specified pregnancy related conditions, unspecified trimester: Secondary | ICD-10-CM

## 2022-08-21 LAB — URINALYSIS, ROUTINE W REFLEX MICROSCOPIC
Bacteria, UA: NONE SEEN
Bilirubin Urine: NEGATIVE
Glucose, UA: NEGATIVE mg/dL
Ketones, ur: NEGATIVE mg/dL
Leukocytes,Ua: NEGATIVE
Nitrite: NEGATIVE
Specific Gravity, Urine: >1.046 — ABNORMAL HIGH (ref 1.005–1.030)
pH: 6 (ref 5.0–8.0)

## 2022-08-21 LAB — CBC WITH DIFFERENTIAL/PLATELET
Abs Immature Granulocytes: 0.08 K/uL — ABNORMAL HIGH (ref 0.00–0.07)
Basophils Absolute: 0 K/uL (ref 0.0–0.1)
Basophils Relative: 0 %
Eosinophils Absolute: 0.2 K/uL (ref 0.0–0.5)
Eosinophils Relative: 2 %
HCT: 36.9 % (ref 36.0–46.0)
Hemoglobin: 12.6 g/dL (ref 12.0–15.0)
Immature Granulocytes: 1 %
Lymphocytes Relative: 15 %
Lymphs Abs: 1.8 K/uL (ref 0.7–4.0)
MCH: 28.9 pg (ref 26.0–34.0)
MCHC: 34.1 g/dL (ref 30.0–36.0)
MCV: 84.6 fL (ref 80.0–100.0)
Monocytes Absolute: 0.8 K/uL (ref 0.1–1.0)
Monocytes Relative: 7 %
Neutro Abs: 8.8 K/uL — ABNORMAL HIGH (ref 1.7–7.7)
Neutrophils Relative %: 75 %
Platelets: 246 K/uL (ref 150–400)
RBC: 4.36 MIL/uL (ref 3.87–5.11)
RDW: 13.2 % (ref 11.5–15.5)
WBC: 11.7 K/uL — ABNORMAL HIGH (ref 4.0–10.5)
nRBC: 0 % (ref 0.0–0.2)

## 2022-08-21 LAB — PROTIME-INR
INR: 1 (ref 0.8–1.2)
Prothrombin Time: 12.9 s (ref 11.4–15.2)

## 2022-08-21 LAB — HEPATIC FUNCTION PANEL
ALT: 7 U/L (ref 0–44)
AST: 7 U/L — ABNORMAL LOW (ref 15–41)
Albumin: 3.6 g/dL (ref 3.5–5.0)
Alkaline Phosphatase: 54 U/L (ref 38–126)
Bilirubin, Direct: 0.1 mg/dL (ref 0.0–0.2)
Indirect Bilirubin: 0.2 mg/dL — ABNORMAL LOW (ref 0.3–0.9)
Total Bilirubin: 0.3 mg/dL (ref 0.3–1.2)
Total Protein: 7.3 g/dL (ref 6.5–8.1)

## 2022-08-21 LAB — BASIC METABOLIC PANEL WITH GFR
Anion gap: 10 (ref 5–15)
BUN: 8 mg/dL (ref 6–20)
CO2: 23 mmol/L (ref 22–32)
Calcium: 9.3 mg/dL (ref 8.9–10.3)
Chloride: 102 mmol/L (ref 98–111)
Creatinine, Ser: 0.45 mg/dL (ref 0.44–1.00)
GFR, Estimated: >60 mL/min
Glucose, Bld: 94 mg/dL (ref 70–99)
Potassium: 3.7 mmol/L (ref 3.5–5.1)
Sodium: 135 mmol/L (ref 135–145)

## 2022-08-21 MED ORDER — FENTANYL CITRATE PF 50 MCG/ML IJ SOSY: 50.0000 ug | PREFILLED_SYRINGE | Freq: Once | INTRAMUSCULAR | Status: DC

## 2022-08-21 MED ORDER — METOCLOPRAMIDE HCL 5 MG/ML IJ SOLN
10.0000 mg | Freq: Once | INTRAMUSCULAR | Status: AC
  Administered 2022-08-21: 10 mg via INTRAVENOUS
  Filled 2022-08-21: qty 2

## 2022-08-21 MED ORDER — DIPHENHYDRAMINE HCL 50 MG/ML IJ SOLN
25.0000 mg | Freq: Once | INTRAMUSCULAR | Status: AC
  Administered 2022-08-21: 25 mg via INTRAVENOUS
  Filled 2022-08-21: qty 1

## 2022-08-21 MED ORDER — SODIUM CHLORIDE 0.9 % IV BOLUS
1000.0000 mL | Freq: Once | INTRAVENOUS | Status: AC
  Administered 2022-08-21: 1000 mL via INTRAVENOUS

## 2022-08-21 MED ORDER — ACETAMINOPHEN 500 MG PO TABS
1000.0000 mg | ORAL_TABLET | Freq: Once | ORAL | Status: AC
  Administered 2022-08-21: 1000 mg via ORAL
  Filled 2022-08-21: qty 2

## 2022-08-21 MED ORDER — DROPERIDOL 2.5 MG/ML IJ SOLN
2.5000 mg | Freq: Once | INTRAMUSCULAR | Status: AC
  Administered 2022-08-21: 2.5 mg via INTRAVENOUS
  Filled 2022-08-21: qty 2

## 2022-08-21 MED ORDER — IOHEXOL 350 MG/ML SOLN
100.0000 mL | Freq: Once | INTRAVENOUS | Status: AC | PRN
  Administered 2022-08-21: 75 mL via INTRAVENOUS

## 2022-08-21 MED ORDER — FENTANYL CITRATE PF 50 MCG/ML IJ SOSY
50.0000 ug | PREFILLED_SYRINGE | Freq: Once | INTRAMUSCULAR | Status: AC
  Administered 2022-08-21: 50 ug via INTRAVENOUS
  Filled 2022-08-21: qty 1

## 2022-08-21 NOTE — ED Triage Notes (Signed)
Pt c/o migraine x1 day.  Pain score 10/10.  Pt endorses light sensitivity and blurred vision.  Pt reports taking Tylenol w/o relief.  Pt is [redacted] weeks pregnant.

## 2022-08-21 NOTE — ED Provider Notes (Signed)
[facility] Provider Note  CSN: [id number] Arrival date & time: 08/21/22 1746  Chief Complaint(s) Migraine  HPI Shannon Lopez is a 32 y.o. female history of obesity, pregnancy at 26 weeks, presenting the emergency department headache.  Reports that the pain began yesterday.  Reports it was gradual onset.  No fevers or chills, neck stiffness.  No head trauma.  Denies any similar headaches in the past.  Reports that it is severe.  Reports associated photophobia, nausea, vomiting.  Headache is worse on the left side of the head.  No history of blood clots.   Past Medical History Past Medical History:  Diagnosis Date   Allergy    Asthma    Depression 06/18/2015   Morbid obesity with BMI of 40.0-44.9, adult [facility])    Patient Active Problem List   Diagnosis Date Noted   Acute asthma exacerbation 03/04/2022   Polycystic ovaries 02/18/2017   Asthma 05/20/2014   Home Medication(s) Prior to Admission medications   Medication Sig Start Date End Date Taking? Authorizing Provider  acetaminophen (TYLENOL) 500 MG tablet Take 500 mg by mouth every 6 (six) hours as needed for mild pain or headache.    [provider]  albuterol (PROVENTIL) (5 MG/ML) 0.5% nebulizer solution Take 0.5 mLs (2.5 mg total) by nebulization every 6 (six) hours as needed for wheezing or shortness of breath. 03/04/22   Varney Biles, MD  beclomethasone (QVAR) 80 MCG/ACT inhaler Inhale 2 puffs into the lungs daily.    [provider]                                                                                                                                    Past Surgical History History reviewed. No pertinent surgical history. Family History Family History  Problem Relation Age of Onset   Hyperlipidemia Father    Heart disease Father    Diabetes Father    Irritable bowel syndrome Father    Diabetes Maternal Grandmother        both sides of the family    Irritable bowel syndrome Sister     Social History Social History   Tobacco Use   Smoking status: Former    Types: Cigarettes    Quit date: 2015    Years since quitting: 9.0   Smokeless tobacco: Never  Substance Use Topics   Alcohol use: No    Alcohol/week: 0.0 standard drinks of alcohol   Drug use: No   Allergies Latex and Oxycodone  Review of Systems Review of Systems  All other systems reviewed and are negative.   Physical Exam Vital Signs  I have reviewed the triage vital signs BP 139/84   Pulse 86   Temp 99 F (37.2 C) (Oral)   Resp 18   LMP 02/16/2022   SpO2 99%  Physical Exam Vitals and nursing note reviewed.  Constitutional:  General: She is not in acute distress.    Appearance: She is well-developed.  HENT:     Head: Normocephalic and atraumatic.     Mouth/Throat:     Mouth: Mucous membranes are moist.  Eyes:     Pupils: Pupils are equal, round, and reactive to light.  Cardiovascular:     Rate and Rhythm: Normal rate and regular rhythm.     Heart sounds: No murmur heard. Pulmonary:     Effort: Pulmonary effort is normal. No respiratory distress.     Breath sounds: Normal breath sounds.  Abdominal:     General: Abdomen is flat.     Palpations: Abdomen is soft.     Tenderness: There is no abdominal tenderness.     Comments: Gravid uterus   Musculoskeletal:        General: No tenderness.     Cervical back: Neck supple.     Right lower leg: No edema.     Left lower leg: No edema.  Skin:    General: Skin is warm and dry.  Neurological:     General: No focal deficit present.     Mental Status: She is alert. Mental status is at baseline.     Comments: Cranial nerves II through XII intact, strength 5 out of 5 in the bilateral upper and lower extremities, no sensory deficit to light touch, no dysmetria on finger-nose-finger testing  Psychiatric:        Mood and Affect: Mood normal.        Behavior: Behavior normal.     ED Results and  Treatments Labs (all labs ordered are listed, but only abnormal results are displayed) Labs Reviewed  CBC WITH DIFFERENTIAL/PLATELET - Abnormal; Notable for the following components:      Result Value   WBC 11.7 (*)    Neutro Abs 8.8 (*)    Abs Immature Granulocytes 0.08 (*)    All other components within normal limits  HEPATIC FUNCTION PANEL - Abnormal; Notable for the following components:   AST 7 (*)    Indirect Bilirubin 0.2 (*)    All other components within normal limits  URINALYSIS, ROUTINE W REFLEX MICROSCOPIC - Abnormal; Notable for the following components:   Color, Urine COLORLESS (*)    Specific Gravity, Urine >1.046 (*)    Hgb urine dipstick TRACE (*)    Protein, ur TRACE (*)    All other components within normal limits  PROTIME-INR  BASIC METABOLIC PANEL                                                                                                                          Radiology CT VENOGRAM HEAD  Result Date: 08/21/2022 CLINICAL DATA:  Headache EXAM: CT VENOGRAM HEAD TECHNIQUE: Venographic phase images of the brain were obtained following the administration of intravenous contrast. Multiplanar reformats and maximum intensity projections were generated. RADIATION DOSE REDUCTION: This exam was performed according to the departmental dose-optimization program which includes  automated exposure control, adjustment of the mA and/or kV according to patient size and/or use of iterative reconstruction technique. CONTRAST:  37mL OMNIPAQUE IOHEXOL 350 MG/ML SOLN COMPARISON:  None Available. FINDINGS: Brain: There is no mass, hemorrhage or extra-axial collection. The size and configuration of the ventricles and extra-axial CSF spaces are normal. The brain parenchyma is normal, without acute or chronic infarction. Vascular: No abnormal hyperdensity of the major intracranial arteries or dural venous sinuses. No intracranial atherosclerosis. Superior sagittal sinus: Normal. Straight  sinus: Normal. Inferior sagittal sinus, vein of Galen and internal cerebral veins: Normal. Transverse sinuses: Normal. Sigmoid sinuses: Normal. Visualized jugular veins: Normal. Skull: The visualized skull base, calvarium and extracranial soft tissues are normal. Sinuses/Orbits: No fluid levels or advanced mucosal thickening of the visualized paranasal sinuses. No mastoid or middle ear effusion. The orbits are normal. IMPRESSION: Normal head CT/venogram Electronically Signed   By: Deatra Robinson M.D.   On: 08/21/2022 21:26    Pertinent labs & imaging results that were available during my care of the patient were reviewed by me and considered in my medical decision making (see MDM for details).  Medications Ordered in ED Medications  sodium chloride 0.9 % bolus 1,000 mL (0 mLs Intravenous Stopped 08/21/22 2255)  metoCLOPramide (REGLAN) injection 10 mg (10 mg Intravenous Given 08/21/22 2048)  diphenhydrAMINE (BENADRYL) injection 25 mg (25 mg Intravenous Given 08/21/22 2048)  acetaminophen (TYLENOL) tablet 1,000 mg (1,000 mg Oral Given 08/21/22 2055)  iohexol (OMNIPAQUE) 350 MG/ML injection 100 mL (75 mLs Intravenous Contrast Given 08/21/22 2100)  droperidol (INAPSINE) 2.5 MG/ML injection 2.5 mg (2.5 mg Intravenous Given 08/21/22 2300)  fentaNYL (SUBLIMAZE) injection 50 mcg (50 mcg Intravenous Given 08/21/22 2304)                                                                                                                                     Procedures Procedures  (including critical care time)  Medical Decision Making / ED Course   MDM:  32 year old female presenting to the emergency department with headache.  Patient well-appearing, physical exam with reassuring neurologic exam, gravid abdomen.  Vitals notable for borderline blood pressure of 139/84.  Concern for possible migraine headache.  However, patient did not improve significantly with standard treatment.  CT head and CT venogram performed  without evidence of venous sinus thrombosis or acute intracranial process such as tumor, bleeding.  Headache began gradually, low concern for subarachnoid hemorrhage.  Doubt meningitis, no fevers, no neck stiffness.  Patient does have mildly elevated blood pressure, which could be concerning for early preeclampsia.  Discussed with [facility]U, they accepted the patient for transfer via private vehicle for further evaluation.  LFTs reassuring, urinalysis with trace protein.  Patient will be driven by her partner.  Patient accepted by Gerrit Heck CNM   Clinical Course as of 08/21/22 2343  [facility] Aug 21, 2022  2239 Gerrit Heck  [WS]  Clinical Course User Index [WS] Cristie Hem, MD     Additional history obtained: -Additional history obtained from spouse -External records from outside source obtained and reviewed including: Chart review including previous notes, labs, imaging, consultation notes including current pregnancy records   Lab Tests: -I ordered, reviewed, and interpreted labs.   The pertinent results include:   Labs Reviewed  CBC WITH DIFFERENTIAL/PLATELET - Abnormal; Notable for the following components:      Result Value   WBC 11.7 (*)    Neutro Abs 8.8 (*)    Abs Immature Granulocytes 0.08 (*)    All other components within normal limits  HEPATIC FUNCTION PANEL - Abnormal; Notable for the following components:   AST 7 (*)    Indirect Bilirubin 0.2 (*)    All other components within normal limits  URINALYSIS, ROUTINE W REFLEX MICROSCOPIC - Abnormal; Notable for the following components:   Color, Urine COLORLESS (*)    Specific Gravity, Urine >1.046 (*)    Hgb urine dipstick TRACE (*)    Protein, ur TRACE (*)    All other components within normal limits  PROTIME-INR  BASIC METABOLIC PANEL    Notable for mild non-specific leukocytosis     Imaging Studies ordered: I ordered imaging studies including CT head and venogram On my interpretation imaging demonstrates no  venous sinus thrombosis, hemorrhage, tumor I independently visualized and interpreted imaging. I agree with the radiologist interpretation   Medicines ordered and prescription drug management: Meds ordered this encounter  Medications   sodium chloride 0.9 % bolus 1,000 mL   metoCLOPramide (REGLAN) injection 10 mg   diphenhydrAMINE (BENADRYL) injection 25 mg   acetaminophen (TYLENOL) tablet 1,000 mg   DISCONTD: fentaNYL (SUBLIMAZE) injection 50 mcg   iohexol (OMNIPAQUE) 350 MG/ML injection 100 mL   droperidol (INAPSINE) 2.5 MG/ML injection 2.5 mg   fentaNYL (SUBLIMAZE) injection 50 mcg    -I have reviewed the patients home medicines and have made adjustments as needed   Consultations Obtained: I requested consultation with the ob team,  and discussed lab and imaging findings as well as pertinent plan - they recommend: transfer to [facility]U   Cardiac Monitoring: The patient was maintained on a cardiac monitor.  I personally viewed and interpreted the cardiac monitored which showed an underlying rhythm of: NSR  Social Determinants of Health:  Diagnosis or treatment significantly limited by social determinants of health: obesity   Reevaluation: After the interventions noted above, I reevaluated the patient and found that they have improved  Co morbidities that complicate the patient evaluation  Past Medical History:  Diagnosis Date   Allergy    Asthma    Depression 06/18/2015   Morbid obesity with BMI of 40.0-44.9, adult ([place])       Dispostion: Disposition decision including need for hospitalization was considered, and patient transferred.    Final Clinical Impression(s) / ED Diagnoses Final diagnoses:  Pregnancy headache, antepartum     This chart was dictated using voice recognition software.  Despite best efforts to proofread,  errors can occur which can change the documentation meaning.    Cristie Hem, MD 08/21/22 [phone]

## 2022-08-22 ENCOUNTER — Encounter (HOSPITAL_COMMUNITY): Payer: Self-pay | Admitting: *Deleted

## 2022-08-22 DIAGNOSIS — R519 Headache, unspecified: Secondary | ICD-10-CM

## 2022-08-22 DIAGNOSIS — O26892 Other specified pregnancy related conditions, second trimester: Secondary | ICD-10-CM

## 2022-08-22 DIAGNOSIS — Z3A25 25 weeks gestation of pregnancy: Secondary | ICD-10-CM

## 2022-08-22 MED ORDER — LACTATED RINGERS IV BOLUS
1000.0000 mL | Freq: Once | INTRAVENOUS | Status: AC
  Administered 2022-08-22: 1000 mL via INTRAVENOUS

## 2022-08-22 MED ORDER — MAGNESIUM SULFATE 2 GM/50ML IV SOLN
2.0000 g | Freq: Once | INTRAVENOUS | Status: AC
  Administered 2022-08-22: 2 g via INTRAVENOUS
  Filled 2022-08-22: qty 50

## 2022-08-22 NOTE — ED Notes (Signed)
Report given to MAU RN at [facility] hosp.

## 2022-08-22 NOTE — MAU Note (Signed)
Pt says she was at [facility] - for trx for H/A  Came here by POV H/A - is 10/10- nothing they did helped

## 2022-08-22 NOTE — MAU Provider Note (Signed)
History     CSN: [id number]  Arrival date and time: 08/21/22 1746   Event Date/Time   First Provider Initiated Contact with Patient 08/22/22 0148      Chief Complaint  Patient presents with   Migraine   HPI  Shannon Lopez is a 32 y.o. G3P0020 at [redacted]w[redacted]d who receives care at [facility]. She is scheduled for her next appt "sometime this month."   She presents today for migraine.  She states it started about 30 hours ago and describes it as "someone banging my head against the wall."  She reports the pain is constant and has no relieving factors, but is worsened with moving.  She rates the headache a 10/10.  She reports taking tylenol at home with no improvement. She endorses fetal movement and denies abdominal cramping or contractions as well as vaginal concerns.   She reports taking daily Quvar, Zyrtec, and Albuterol as needed.  OB History     Gravida  3   Para      Term  0   Preterm  0   AB  2   Living  0      SAB      IAB  1   Ectopic      Multiple      Live Births  0           Past Medical History:  Diagnosis Date   Allergy    Asthma    Depression 06/18/2015   Morbid obesity with BMI of 40.0-44.9, adult [facility])     History reviewed. No pertinent surgical history.  Family History  Problem Relation Age of Onset   Hyperlipidemia Father    Heart disease Father    Diabetes Father    Irritable bowel syndrome Father    Diabetes Maternal Grandmother        both sides of the family   Irritable bowel syndrome Sister     Social History   Tobacco Use   Smoking status: Former    Types: Cigarettes    Quit date: 2015    Years since quitting: 9.0   Smokeless tobacco: Never  Substance Use Topics   Alcohol use: No    Alcohol/week: 0.0 standard drinks of alcohol   Drug use: No    Allergies:  Allergies  Allergen Reactions   Latex Hives   Oxycodone Nausea And Vomiting    Medications Prior to Admission  Medication Sig Dispense Refill Last Dose    albuterol (PROVENTIL) (5 MG/ML) 0.5% nebulizer solution Take 0.5 mLs (2.5 mg total) by nebulization every 6 (six) hours as needed for wheezing or shortness of breath. 20 mL 1 Past Week   beclomethasone (QVAR) 80 MCG/ACT inhaler Inhale 2 puffs into the lungs daily.   08/21/2022   acetaminophen (TYLENOL) 500 MG tablet Take 500 mg by mouth every 6 (six) hours as needed for mild pain or headache.       Review of Systems  Constitutional:  Negative for chills and fever.  Eyes:  Positive for photophobia.  Gastrointestinal:  Positive for nausea and vomiting.  Neurological:  Positive for dizziness and headaches. Negative for light-headedness.   Physical Exam   Blood pressure 137/83, pulse (!) 103, temperature (!) 97.5 F (36.4 C), temperature source Oral, resp. rate 18, height 5\' 6"  (1.676 m), weight 127 kg, last menstrual period 02/16/2022, SpO2 97 %, unknown if currently breastfeeding.  Physical Exam Vitals reviewed.  Constitutional:      General: She is in acute  distress.     Appearance: She is obese. She is not toxic-appearing.  HENT:     Head: Normocephalic and atraumatic.  Eyes:     Conjunctiva/sclera: Conjunctivae normal.  Cardiovascular:     Rate and Rhythm: Normal rate.  Pulmonary:     Effort: Pulmonary effort is normal. No respiratory distress.  Musculoskeletal:        General: Normal range of motion.     Cervical back: Normal range of motion.  Skin:    General: Skin is warm and dry.  Neurological:     Mental Status: She is alert and oriented to person, place, and time.  Psychiatric:        Mood and Affect: Mood normal.        Behavior: Behavior normal.     Fetal Assessment 140 bpm, Mod Var, -Decels, +10x10 Accel Toco: No ctx graphed  MAU Course   Results for orders placed or performed during the hospital encounter of 08/21/22 (from the past 24 hour(s))  CBC with Differential     Status: Abnormal   Collection Time: 08/21/22  8:45 PM  Result Value Ref Range   WBC  11.7 (H) 4.0 - 10.5 K/uL   RBC 4.36 3.87 - 5.11 MIL/uL   Hemoglobin 12.6 12.0 - 15.0 g/dL   HCT 36.9 36.0 - 46.0 %   MCV 84.6 80.0 - 100.0 fL   MCH 28.9 26.0 - 34.0 pg   MCHC 34.1 30.0 - 36.0 g/dL   RDW 13.2 11.5 - 15.5 %   Platelets 246 150 - 400 K/uL   nRBC 0.0 0.0 - 0.2 %   Neutrophils Relative % 75 %   Neutro Abs 8.8 (H) 1.7 - 7.7 K/uL   Lymphocytes Relative 15 %   Lymphs Abs 1.8 0.7 - 4.0 K/uL   Monocytes Relative 7 %   Monocytes Absolute 0.8 0.1 - 1.0 K/uL   Eosinophils Relative 2 %   Eosinophils Absolute 0.2 0.0 - 0.5 K/uL   Basophils Relative 0 %   Basophils Absolute 0.0 0.0 - 0.1 K/uL   Immature Granulocytes 1 %   Abs Immature Granulocytes 0.08 (H) 0.00 - 0.07 K/uL  Protime-INR     Status: None   Collection Time: 08/21/22  8:45 PM  Result Value Ref Range   Prothrombin Time 12.9 11.4 - 15.2 seconds   INR 1.0 0.8 - 1.2  Basic metabolic panel     Status: None   Collection Time: 08/21/22  8:45 PM  Result Value Ref Range   Sodium 135 135 - 145 mmol/L   Potassium 3.7 3.5 - 5.1 mmol/L   Chloride 102 98 - 111 mmol/L   CO2 23 22 - 32 mmol/L   Glucose, Bld 94 70 - 99 mg/dL   BUN 8 6 - 20 mg/dL   Creatinine, Ser 0.45 0.44 - 1.00 mg/dL   Calcium 9.3 8.9 - 10.3 mg/dL   GFR, Estimated >60 >60 mL/min   Anion gap 10 5 - 15  Hepatic function panel     Status: Abnormal   Collection Time: 08/21/22  8:45 PM  Result Value Ref Range   Total Protein 7.3 6.5 - 8.1 g/dL   Albumin 3.6 3.5 - 5.0 g/dL   AST 7 (L) 15 - 41 U/L   ALT 7 0 - 44 U/L   Alkaline Phosphatase 54 38 - 126 U/L   Total Bilirubin 0.3 0.3 - 1.2 mg/dL   Bilirubin, Direct 0.1 0.0 - 0.2 mg/dL   Indirect  Bilirubin 0.2 (L) 0.3 - 0.9 mg/dL  Urinalysis, Routine w reflex microscopic -Urine, Clean Catch     Status: Abnormal   Collection Time: 08/21/22 11:00 PM  Result Value Ref Range   Color, Urine COLORLESS (A) YELLOW   APPearance CLEAR CLEAR   Specific Gravity, Urine >1.046 (H) 1.005 - 1.030   pH 6.0 5.0 - 8.0    Glucose, UA NEGATIVE NEGATIVE mg/dL   Hgb urine dipstick TRACE (A) NEGATIVE   Bilirubin Urine NEGATIVE NEGATIVE   Ketones, ur NEGATIVE NEGATIVE mg/dL   Protein, ur TRACE (A) NEGATIVE mg/dL   Nitrite NEGATIVE NEGATIVE   Leukocytes,Ua NEGATIVE NEGATIVE   RBC / HPF 0-5 0 - 5 RBC/hpf   WBC, UA 0-5 0 - 5 WBC/hpf   Bacteria, UA NONE SEEN NONE SEEN   Squamous Epithelial / HPF 0-5 0 - 5 /HPF   CT VENOGRAM HEAD  Result Date: 08/21/2022 CLINICAL DATA:  Headache EXAM: CT VENOGRAM HEAD TECHNIQUE: Venographic phase images of the brain were obtained following the administration of intravenous contrast. Multiplanar reformats and maximum intensity projections were generated. RADIATION DOSE REDUCTION: This exam was performed according to the departmental dose-optimization program which includes automated exposure control, adjustment of the mA and/or kV according to patient size and/or use of iterative reconstruction technique. CONTRAST:  70mL OMNIPAQUE IOHEXOL 350 MG/ML SOLN COMPARISON:  None Available. FINDINGS: Brain: There is no mass, hemorrhage or extra-axial collection. The size and configuration of the ventricles and extra-axial CSF spaces are normal. The brain parenchyma is normal, without acute or chronic infarction. Vascular: No abnormal hyperdensity of the major intracranial arteries or dural venous sinuses. No intracranial atherosclerosis. Superior sagittal sinus: Normal. Straight sinus: Normal. Inferior sagittal sinus, vein of Galen and internal cerebral veins: Normal. Transverse sinuses: Normal. Sigmoid sinuses: Normal. Visualized jugular veins: Normal. Skull: The visualized skull base, calvarium and extracranial soft tissues are normal. Sinuses/Orbits: No fluid levels or advanced mucosal thickening of the visualized paranasal sinuses. No mastoid or middle ear effusion. The orbits are normal. IMPRESSION: Normal head CT/venogram Electronically Signed   By: Ulyses Jarred M.D.   On: 08/21/2022 21:26     MDM PE Labs: None EFM O2 via [place] MgSO4 2 gram LR Bolus Assessment and Plan  32 year old G3P0020  SIUP at 25.6 weeks Cat I FT Migraine HA  -POC Reviewed. -Exam performed. -Give 2L of O2 via Nasal Cannula.  -Give LR Bolus. -MgSO4 infusion 2gram over 10-20 minutes. -NST in process.  -Monitor and reassess  Maryann Conners MSN, CNM 08/22/2022, 1:48 AM   Reassessment (4:02 AM) -Patient reports improvement in HA and now 6/10. States the pain is manageable.  -Reviewed follow up with primary ob as scheduled. -Referral placed for neurology. -Nurse reports discharge blood pressure elevated, but not severe.  -Patient requested to leave urine sample for PC Ratio, but refuses. -Return precautions reviewed.  -Discharged to home in improved condition.  Maryann Conners MSN, CNM Advanced Practice Provider, [facility]

## 2022-08-24 ENCOUNTER — Encounter: Payer: Self-pay | Admitting: Neurology

## 2022-09-01 ENCOUNTER — Ambulatory Visit: Payer: Medicaid Other | Admitting: *Deleted

## 2022-09-01 ENCOUNTER — Ambulatory Visit: Payer: Medicaid Other | Attending: Obstetrics and Gynecology

## 2022-09-01 ENCOUNTER — Encounter: Payer: Self-pay | Admitting: *Deleted

## 2022-09-01 ENCOUNTER — Other Ambulatory Visit: Payer: Self-pay

## 2022-09-01 DIAGNOSIS — Z3A26 26 weeks gestation of pregnancy: Secondary | ICD-10-CM

## 2022-09-01 DIAGNOSIS — O99213 Obesity complicating pregnancy, third trimester: Secondary | ICD-10-CM

## 2022-09-01 DIAGNOSIS — O0933 Supervision of pregnancy with insufficient antenatal care, third trimester: Secondary | ICD-10-CM

## 2022-09-01 DIAGNOSIS — O99212 Obesity complicating pregnancy, second trimester: Secondary | ICD-10-CM

## 2022-09-01 DIAGNOSIS — E669 Obesity, unspecified: Secondary | ICD-10-CM

## 2022-09-28 NOTE — Progress Notes (Deleted)
NEUROLOGY CONSULTATION NOTE  Shannon Lopez MRN: [id number] DOB: 1991/05/06  Referring provider: Gavin Pound, CNM Primary care provider: Donnel Saxon, CNM  Reason for consult:  headache  Assessment/Plan:   ***   Subjective:  Shannon Lopez is a 32 year old female who presents for headache.  History supplemented by referring provider's note.  History of migraines ***.  She is currently *** pregnant.  On 08/21/2022, she ***.  CTV head personally reviewed was normal.  ***  Past NSAIDS/analgesics:  ibuprofen, naproxen, celecoxib Past abortive triptans:  *** Past abortive ergotamine:  *** Past muscle relaxants:  *** Past anti-emetic:  *** Past antihypertensive medications:  *** Past antidepressant medications:  sertraline, bupropion Past anticonvulsant medications:  *** Past anti-CGRP:  *** Past vitamins/Herbal/Supplements:  *** Past antihistamines/decongestants:  cetirizine, fluticasone Other past therapies:  ***  Current NSAIDS/analgesics:  acetaminophen Current triptans:  none Current ergotamine:  none Current anti-emetic:  *** Current muscle relaxants:  none Current Antihypertensive medications:  none Current Antidepressant medications:  none Current Anticonvulsant medications:  none Current anti-CGRP:  none Current Vitamins/Herbal/Supplements:  *** Current Antihistamines/Decongestants:  none Other therapy:  *** Birth control:  none   Caffeine:  *** Alcohol:  *** Smoker:  *** Diet:  *** Exercise:  *** Depression:  ***; Anxiety:  *** Other pain:  *** Sleep hygiene:  *** Family history of headache:  ***      PAST MEDICAL HISTORY: Past Medical History:  Diagnosis Date   Allergy    Asthma    Depression 06/18/2015   Morbid obesity with BMI of 40.0-44.9, adult ([place])     PAST SURGICAL HISTORY: No past surgical history on file.  MEDICATIONS: Current Outpatient Medications on File Prior to Visit  Medication Sig Dispense Refill   acetaminophen (TYLENOL)  500 MG tablet Take 500 mg by mouth every 6 (six) hours as needed for mild pain or headache.     albuterol (PROVENTIL) (5 MG/ML) 0.5% nebulizer solution Take 0.5 mLs (2.5 mg total) by nebulization every 6 (six) hours as needed for wheezing or shortness of breath. 20 mL 1   aspirin EC 81 MG tablet Take 81 mg by mouth daily. Swallow whole.     beclomethasone (QVAR) 80 MCG/ACT inhaler Inhale 2 puffs into the lungs daily.     Cholecalciferol (D3 ADULT PO) Take by mouth.     Prenatal Vit-Fe Fumarate-FA (PRENATAL VITAMINS PO) Take by mouth.     No current facility-administered medications on file prior to visit.    ALLERGIES: Allergies  Allergen Reactions   Latex Hives   Oxycodone Nausea And Vomiting    FAMILY HISTORY: Family History  Problem Relation Age of Onset   Hyperlipidemia Father    Heart disease Father    Diabetes Father    Irritable bowel syndrome Father    Diabetes Maternal Grandmother        both sides of the family   Irritable bowel syndrome Sister     Objective:  *** General: No acute distress.  Patient appears well-groomed.   Head:  Normocephalic/atraumatic Eyes:  fundi examined but not visualized Neck: supple, no paraspinal tenderness, full range of motion Back: No paraspinal tenderness Heart: regular rate and rhythm Lungs: Clear to auscultation bilaterally. Vascular: No carotid bruits. Neurological Exam: Mental status: alert and oriented to person, place, and time, speech fluent and not dysarthric, language intact. Cranial nerves: CN I: not tested CN II: pupils equal, round and reactive to light, visual fields intact CN III, IV, VI:  full  range of motion, no nystagmus, no ptosis CN V: facial sensation intact. CN VII: upper and lower face symmetric CN VIII: hearing intact CN IX, X: gag intact, uvula midline CN XI: sternocleidomastoid and trapezius muscles intact CN XII: tongue midline Bulk & Tone: normal, no fasciculations. Motor:  muscle strength 5/5  throughout Sensation:  Pinprick, temperature and vibratory sensation intact. Deep Tendon Reflexes:  2+ throughout,  toes downgoing.   Finger to nose testing:  Without dysmetria.   Gait:  Normal station and stride.  Romberg negative.    Thank you for allowing me to take part in the care of this patient.  Metta Clines, DO  CC:  Donnel Saxon, CNM  Gavin Pound, CNM

## 2022-09-29 ENCOUNTER — Ambulatory Visit: Payer: Medicaid Other | Admitting: Neurology

## 2022-09-29 ENCOUNTER — Encounter: Payer: Self-pay | Admitting: Neurology

## 2022-10-01 ENCOUNTER — Ambulatory Visit: Payer: Medicaid Other

## 2022-10-01 ENCOUNTER — Ambulatory Visit: Payer: Medicaid Other | Attending: Obstetrics

## 2022-10-06 ENCOUNTER — Encounter (HOSPITAL_COMMUNITY): Payer: Self-pay | Admitting: Obstetrics and Gynecology

## 2022-10-06 ENCOUNTER — Inpatient Hospital Stay (HOSPITAL_COMMUNITY)
Admission: AD | Admit: 2022-10-06 | Discharge: 2022-10-07 | Disposition: A | Discharge status: Home / Self Care | Payer: Medicaid Other | Attending: Obstetrics and Gynecology | Admitting: Obstetrics and Gynecology

## 2022-10-06 DIAGNOSIS — R519 Headache, unspecified: Secondary | ICD-10-CM | POA: Diagnosis not present

## 2022-10-06 DIAGNOSIS — Z3A31 31 weeks gestation of pregnancy: Secondary | ICD-10-CM | POA: Diagnosis not present

## 2022-10-06 DIAGNOSIS — O26893 Other specified pregnancy related conditions, third trimester: Secondary | ICD-10-CM | POA: Diagnosis not present

## 2022-10-06 DIAGNOSIS — K219 Gastro-esophageal reflux disease without esophagitis: Secondary | ICD-10-CM | POA: Insufficient documentation

## 2022-10-06 DIAGNOSIS — I10 Essential (primary) hypertension: Secondary | ICD-10-CM

## 2022-10-06 DIAGNOSIS — M7989 Other specified soft tissue disorders: Secondary | ICD-10-CM | POA: Insufficient documentation

## 2022-10-06 DIAGNOSIS — R112 Nausea with vomiting, unspecified: Secondary | ICD-10-CM

## 2022-10-06 DIAGNOSIS — O99613 Diseases of the digestive system complicating pregnancy, third trimester: Secondary | ICD-10-CM | POA: Diagnosis not present

## 2022-10-06 DIAGNOSIS — R609 Edema, unspecified: Secondary | ICD-10-CM | POA: Diagnosis not present

## 2022-10-06 DIAGNOSIS — R030 Elevated blood-pressure reading, without diagnosis of hypertension: Secondary | ICD-10-CM | POA: Insufficient documentation

## 2022-10-06 DIAGNOSIS — K2100 Gastro-esophageal reflux disease with esophagitis, without bleeding: Secondary | ICD-10-CM

## 2022-10-06 LAB — URINALYSIS, ROUTINE W REFLEX MICROSCOPIC
Bacteria, UA: NONE SEEN
Bilirubin Urine: NEGATIVE
Glucose, UA: >=500 mg/dL — AB
Ketones, ur: NEGATIVE mg/dL
Leukocytes,Ua: NEGATIVE
Nitrite: NEGATIVE
Protein, ur: NEGATIVE mg/dL
Specific Gravity, Urine: 1.028 (ref 1.005–1.030)
pH: 6 (ref 5.0–8.0)

## 2022-10-06 LAB — COMPREHENSIVE METABOLIC PANEL
ALT: 11 U/L (ref 0–44)
AST: 10 U/L — ABNORMAL LOW (ref 15–41)
Albumin: 2.4 g/dL — ABNORMAL LOW (ref 3.5–5.0)
Alkaline Phosphatase: 96 U/L (ref 38–126)
Anion gap: 13 (ref 5–15)
BUN: 11 mg/dL (ref 6–20)
CO2: 20 mmol/L — ABNORMAL LOW (ref 22–32)
Calcium: 9.4 mg/dL (ref 8.9–10.3)
Chloride: 99 mmol/L (ref 98–111)
Creatinine, Ser: 0.85 mg/dL (ref 0.44–1.00)
GFR, Estimated: >60 mL/min (ref >60)
Glucose, Bld: 344 mg/dL — ABNORMAL HIGH (ref 70–99)
Potassium: 4.3 mmol/L (ref 3.5–5.1)
Sodium: 132 mmol/L — ABNORMAL LOW (ref 135–145)
Total Bilirubin: 0.3 mg/dL (ref 0.3–1.2)
Total Protein: 6.3 g/dL — ABNORMAL LOW (ref 6.5–8.1)

## 2022-10-06 LAB — CBC
HCT: 36.1 % (ref 36.0–46.0)
Hemoglobin: 12.4 g/dL (ref 12.0–15.0)
MCH: 29.2 pg (ref 26.0–34.0)
MCHC: 34.3 g/dL (ref 30.0–36.0)
MCV: 85.1 fL (ref 80.0–100.0)
Platelets: 223 10*3/uL (ref 150–400)
RBC: 4.24 MIL/uL (ref 3.87–5.11)
RDW: 13.3 % (ref 11.5–15.5)
WBC: 7.4 10*3/uL (ref 4.0–10.5)
nRBC: 0 % (ref 0.0–0.2)

## 2022-10-06 LAB — PROTEIN / CREATININE RATIO, URINE
Creatinine, Urine: 70 mg/dL
Protein Creatinine Ratio: 0.11 mg/mg{Cre} (ref 0.00–0.15)
Total Protein, Urine: 8 mg/dL

## 2022-10-06 MED ORDER — CYCLOBENZAPRINE HCL 5 MG PO TABS
10.0000 mg | ORAL_TABLET | Freq: Once | ORAL | Status: DC
  Filled 2022-10-06: qty 2

## 2022-10-06 NOTE — MAU Provider Note (Signed)
Chief Complaint:  Hypertension, Foot Swelling, and arms swelling   Event Date/Time   First Provider Initiated Contact with Patient 10/06/22 2200     HPI: Shannon Lopez is a 32 y.o. G3P0020 at 55w5dwho presents to maternity admissions reporting elevated blood pressure and headache.  States has taken Tylenol 3 hours ago without relief.  States BP at home was very high.  Has not been in office since early February, states office will not return her calls  Martin Majestic in person today and got an appointment for next week. . She reports good fetal movement, denies LOF, vaginal bleeding,dizziness, n/v, diarrhea, constipation or fever/chills.  She denies visual changes or RUQ abdominal pain.  Hypertension The current episode started today. The problem is unchanged. Associated symptoms include headaches and peripheral edema. Pertinent negatives include no blurred vision, chest pain or malaise/fatigue. There are no associated agents to hypertension. Past treatments include nothing. Compliance problems: Has not had any prenatal visits since early Feb (patient states it is because the office will not return her calls)   Headache  This is a recurrent problem. The current episode started 1 to 4 weeks ago. The problem occurs constantly. The problem has been unchanged. The quality of the pain is described as aching. Pertinent negatives include no abdominal pain, blurred vision, dizziness, fever or photophobia. Nothing aggravates the symptoms. She has tried acetaminophen for the symptoms. The treatment provided no relief. Her past medical history is significant for hypertension.   RN Note: Shannon Lopez is a 32 y.o. at [redacted]w[redacted]d here in MAU reporting high blood pressure and swelling in arms and feet for several days. Has headache tonight. No meds. Has been seen several times with h/a during the pregnancy and no prior h/a hx. Reports good FM and denies LOF or VB  Onset of complaint: 2-3 days                 Pain score: 5  Past  Medical History: Past Medical History:  Diagnosis Date   Allergy    Asthma    Depression 06/18/2015   Morbid obesity with BMI of 40.0-44.9, adult ([facility])     Past obstetric history: OB History  Gravida Para Term Preterm AB Living  3   0 0 2 0  SAB IAB Ectopic Multiple Live Births    2     0    # Outcome Date GA Lbr Len/2nd Weight Sex Delivery Anes PTL Lv  3 Current           2 IAB           1 IAB             Past Surgical History: No past surgical history on file.  Family History: Family History  Problem Relation Age of Onset   Hyperlipidemia Father    Heart disease Father    Diabetes Father    Irritable bowel syndrome Father    Diabetes Maternal Grandmother        both sides of the family   Irritable bowel syndrome Sister     Social History: Social History   Tobacco Use   Smoking status: Former    Types: Cigarettes    Quit date: 2015    Years since quitting: 9.2   Smokeless tobacco: Never  Vaping Use   Vaping Use: Never used  Substance Use Topics   Alcohol use: No    Alcohol/week: 0.0 standard drinks of alcohol   Drug use: No  Allergies:  Allergies  Allergen Reactions   Latex Hives   Oxycodone Nausea And Vomiting    Meds:  Medications Prior to Admission  Medication Sig Dispense Refill Last Dose   acetaminophen (TYLENOL) 500 MG tablet Take 500 mg by mouth every 6 (six) hours as needed for mild pain or headache.      albuterol (PROVENTIL) (5 MG/ML) 0.5% nebulizer solution Take 0.5 mLs (2.5 mg total) by nebulization every 6 (six) hours as needed for wheezing or shortness of breath. 20 mL 1    aspirin EC 81 MG tablet Take 81 mg by mouth daily. Swallow whole.      beclomethasone (QVAR) 80 MCG/ACT inhaler Inhale 2 puffs into the lungs daily.      Cholecalciferol (D3 ADULT PO) Take by mouth.      Prenatal Vit-Fe Fumarate-FA (PRENATAL VITAMINS PO) Take by mouth.       I have reviewed patient's Past Medical Hx, Surgical Hx, Family Hx, Social Hx,  medications and allergies.   ROS:  Review of Systems  Constitutional:  Negative for fever and malaise/fatigue.  Eyes:  Negative for blurred vision and photophobia.  Cardiovascular:  Negative for chest pain.  Gastrointestinal:  Negative for abdominal pain.  Neurological:  Positive for headaches. Negative for dizziness.   Other systems negative  Physical Exam  Patient Vitals for the past 24 hrs:  BP Temp Pulse Resp SpO2 Height Weight  10/06/22 2142 (!) 128/95 -- -- -- -- -- --  10/06/22 2140 -- 98.2 F (36.8 C) 77 20 97 % 5' 5.5" (1.664 m) (!) 137.4 kg   2223hrs 136/73  126/76   108/45  102/50   121/66    146/72  147/61   Constitutional: Well-developed, well-nourished female in no acute distress.  Cardiovascular: normal rate  Respiratory: normal effort GI: Abd soft, non-tender, gravid appropriate for gestational age.   No rebound or guarding. MS: Extremities nontender, Trace to 1+ pedal edema, normal ROM Neurologic: Alert and oriented x 4. DTRs 2+, no clonus GU: Neg CVAT.  PELVIC EXAM: Deferred   FHT:  Baseline 140 , moderate variability, accelerations present, no decelerations Contractions: Uterine irritability   Labs: Results for orders placed or performed during the hospital encounter of 10/06/22 (from the past 24 hour(s))  Urinalysis, Routine w reflex microscopic -Urine, Clean Catch     Status: Abnormal   Collection Time: 10/06/22  9:50 PM  Result Value Ref Range   Color, Urine YELLOW YELLOW   APPearance CLEAR CLEAR   Specific Gravity, Urine 1.028 1.005 - 1.030   pH 6.0 5.0 - 8.0   Glucose, UA >=500 (A) NEGATIVE mg/dL   Hgb urine dipstick SMALL (A) NEGATIVE   Bilirubin Urine NEGATIVE NEGATIVE   Ketones, ur NEGATIVE NEGATIVE mg/dL   Protein, ur NEGATIVE NEGATIVE mg/dL   Nitrite NEGATIVE NEGATIVE   Leukocytes,Ua NEGATIVE NEGATIVE   RBC / HPF 11-20 0 - 5 RBC/hpf   WBC, UA 0-5 0 - 5 WBC/hpf   Bacteria, UA NONE SEEN NONE SEEN   Squamous Epithelial / HPF  0-5 0 - 5 /HPF   Mucus PRESENT   Protein / creatinine ratio, urine     Status: None   Collection Time: 10/06/22  9:50 PM  Result Value Ref Range   Creatinine, Urine 70 mg/dL   Total Protein, Urine 8 mg/dL   Protein Creatinine Ratio 0.11 0.00 - 0.15 mg/mg[Cre]  CBC     Status: None   Collection Time: 10/06/22 10:38 PM  Result Value  Ref Range   WBC 7.4 4.0 - 10.5 K/uL   RBC 4.24 3.87 - 5.11 MIL/uL   Hemoglobin 12.4 12.0 - 15.0 g/dL   HCT 36.1 36.0 - 46.0 %   MCV 85.1 80.0 - 100.0 fL   MCH 29.2 26.0 - 34.0 pg   MCHC 34.3 30.0 - 36.0 g/dL   RDW 13.3 11.5 - 15.5 %   Platelets 223 150 - 400 K/uL   nRBC 0.0 0.0 - 0.2 %  Comprehensive metabolic panel     Status: Abnormal   Collection Time: 10/06/22 10:38 PM  Result Value Ref Range   Sodium 132 (L) 135 - 145 mmol/L   Potassium 4.3 3.5 - 5.1 mmol/L   Chloride 99 98 - 111 mmol/L   CO2 20 (L) 22 - 32 mmol/L   Glucose, Bld 344 (H) 70 - 99 mg/dL   BUN 11 6 - 20 mg/dL   Creatinine, Ser 0.85 0.44 - 1.00 mg/dL   Calcium 9.4 8.9 - 10.3 mg/dL   Total Protein 6.3 (L) 6.5 - 8.1 g/dL   Albumin 2.4 (L) 3.5 - 5.0 g/dL   AST 10 (L) 15 - 41 U/L   ALT 11 0 - 44 U/L   Alkaline Phosphatase 96 38 - 126 U/L   Total Bilirubin 0.3 0.3 - 1.2 mg/dL   GFR, Estimated >60 >60 mL/min   Anion gap 13 5 - 15    --/--/O POS (10/06 1122)  Imaging:  No results found.  MAU Course/MDM: I have reviewed the triage vital signs and the nursing notes.   Pertinent labs & imaging results that were available during my care of the patient were reviewed by me and considered in my medical decision making (see chart for details).      I have reviewed her medical records including past results, notes and treatments.   I have ordered labs and reviewed results. These are normal except for elevated Glucose (has not done glucola in office) NST reviewed, reactive Consult Dr Mancel Bale with presentation, exam findings and test results. She will arrange followup in  office Treatments in MAU included Flexeril ordered for headache since she recently took Tylenol.  Refused, stating she was not comfortable taking med.  Declines all meds for headache, states wants water. .   Reviewed results with patient.  She requests a work note.  Assessment: Single IUP at [redacted]w[redacted]d Episodic hypertension Normal preeclampsia labs Nausea and vomiting, resolved Swelling Headache  Plan: Discharge home Supportive care and  fetal kick counts Dr Mancel Bale will arrange followup in office Follow up in Office for prenatal visits  Encouraged to return if she develops worsening of symptoms, increase in pain, fever, or other concerning symptoms.   Pt stable at time of discharge.  Hansel Feinstein CNM, MSN Certified Nurse-Midwife 10/06/2022 10:00 PM

## 2022-10-06 NOTE — MAU Note (Signed)
.  Shannon Lopez is a 32 y.o. at [redacted]w[redacted]d here in MAU reporting high blood pressure and swelling in arms and feet for several days. Has headache tonight. No meds. Has been seen several times with h/a during the pregnancy and no prior h/a hx. Reports good FM and denies LOF or VB  Onset of complaint: 2-3 days Pain score: 5 Vitals:   10/06/22 2140 10/06/22 2142  BP:  (!) 128/95  Pulse: 77   Resp: 20   Temp: 98.2 F (36.8 C)   SpO2: 97%      FHT:145 Lab orders placed from triage:  u/a

## 2022-10-07 DIAGNOSIS — Z3A31 31 weeks gestation of pregnancy: Secondary | ICD-10-CM

## 2022-10-07 DIAGNOSIS — R112 Nausea with vomiting, unspecified: Secondary | ICD-10-CM

## 2022-10-07 DIAGNOSIS — K2100 Gastro-esophageal reflux disease with esophagitis, without bleeding: Secondary | ICD-10-CM

## 2022-10-07 DIAGNOSIS — R519 Headache, unspecified: Secondary | ICD-10-CM

## 2022-10-07 DIAGNOSIS — O26893 Other specified pregnancy related conditions, third trimester: Secondary | ICD-10-CM

## 2022-10-07 DIAGNOSIS — R609 Edema, unspecified: Secondary | ICD-10-CM

## 2022-10-07 DIAGNOSIS — I10 Essential (primary) hypertension: Secondary | ICD-10-CM

## 2022-10-07 MED ORDER — ONDANSETRON 4 MG PO TBDP: 4.0000 mg | ORAL_TABLET | Freq: Four times a day (QID) | ORAL | 0 refills | Status: DC | PRN

## 2022-10-07 MED ORDER — PANTOPRAZOLE SODIUM 20 MG PO TBEC: 20.0000 mg | DELAYED_RELEASE_TABLET | Freq: Every day | ORAL | 0 refills | Status: DC

## 2022-11-06 ENCOUNTER — Other Ambulatory Visit: Payer: Self-pay | Admitting: Obstetrics

## 2022-11-06 ENCOUNTER — Ambulatory Visit: Payer: Medicaid Other | Attending: Obstetrics

## 2022-11-06 ENCOUNTER — Ambulatory Visit (HOSPITAL_BASED_OUTPATIENT_CLINIC_OR_DEPARTMENT_OTHER): Payer: Medicaid Other | Admitting: Obstetrics and Gynecology

## 2022-11-06 ENCOUNTER — Ambulatory Visit: Payer: Medicaid Other | Admitting: *Deleted

## 2022-11-06 VITALS — BP 141/87 | HR 63

## 2022-11-06 DIAGNOSIS — O99213 Obesity complicating pregnancy, third trimester: Secondary | ICD-10-CM | POA: Insufficient documentation

## 2022-11-06 DIAGNOSIS — E668 Other obesity: Secondary | ICD-10-CM | POA: Insufficient documentation

## 2022-11-06 DIAGNOSIS — O24415 Gestational diabetes mellitus in pregnancy, controlled by oral hypoglycemic drugs: Secondary | ICD-10-CM | POA: Diagnosis not present

## 2022-11-06 DIAGNOSIS — E669 Obesity, unspecified: Secondary | ICD-10-CM

## 2022-11-06 DIAGNOSIS — O0933 Supervision of pregnancy with insufficient antenatal care, third trimester: Secondary | ICD-10-CM

## 2022-11-06 DIAGNOSIS — Z3A36 36 weeks gestation of pregnancy: Secondary | ICD-10-CM | POA: Diagnosis not present

## 2022-11-06 DIAGNOSIS — R030 Elevated blood-pressure reading, without diagnosis of hypertension: Secondary | ICD-10-CM

## 2022-11-06 DIAGNOSIS — J45909 Unspecified asthma, uncomplicated: Secondary | ICD-10-CM

## 2022-11-06 DIAGNOSIS — O99891 Other specified diseases and conditions complicating pregnancy: Secondary | ICD-10-CM | POA: Diagnosis not present

## 2022-11-06 DIAGNOSIS — O99513 Diseases of the respiratory system complicating pregnancy, third trimester: Secondary | ICD-10-CM

## 2022-11-06 NOTE — Progress Notes (Deleted)
Maternal-Fetal Medicine   Name: Shannon Lopez DOB: 08/15/1990 MRN: [id number] Referring Provider: Vicki Latham, CNM  I had the pleasure of seeing [facility] at [facility]. She is G3 P0020 at 36w 1d gestation and is here for fetal growth assessment and consultation. She has gestational diabetes and takes metformin XR 1,000 mg twice daily.  She reports her fasting levels range from 95-110 mg/DL and postprandial levels are within normal range. Her blood pressures have been normal at prenatal visits. Medications: Low-dose aspirin, metformin, prenatal vitamins. Allergies: No known drug allergies.  Patient reports that on cell-free fetal DNA screening, the risks of fetal aneuploidies are not increased.  Ultrasound On today's ultrasound, the estimated fetal weight is at the 89 percentile and the abdominal circumference measurement is at the 99 percentile.  Amniotic fluid is normal and good fetal activity seen.  Breech presentation.  Antenatal testing is reassuring.  BPP 8/8. Fetal anatomical survey is very difficult to evaluate because of advanced gestational age.  Blood pressures today at our office were 141/87 mmHg.  Repeat blood pressures were 160/89 and 148/90 mmHg.  Gestational diabetes I discussed blood sugar parameters patient understands that fasting levels are still high.  She takes metformin 2,000 mg daily.  Applications of poorly controlled diabetes include fetal macrosomia and neurological injuries at birth.  Stillbirth is increased with poorly controlled diabetes.  She had 3 blood pressure readings and it is likely that she has gestational hypertension.  I recommended evaluation at the MAU for series of blood pressures.  Patient reported that she would like to check blood pressures at home and call your office if persistent hypertension is seen. We discussed timing of delivery.  To control fasting blood glucose levels, she would require insulin. Delivery  may be considered at 37 weeks' gestation if fasting levels are not within normal range.  If gestational hypertension is confirmed, she should be delivered at 37 weeks.  Breech presentation: Patient understands that cesarean delivery will be recommended if breech presentation persists. External cephalic version may be tried at 37 weeks provided she does not have severe range hypertension.   Patient had questions about cesarean delivery.  I explained cesarean delivery and possible complications including hemorrhage, infection and venous thromboembolism.  I discussed the findings and recommendations with Kylie Hands, NP.  Recommendations -Consider delivery at [redacted] weeks gestation if diabetes not well-controlled. -Recommend delivery at [redacted] weeks gestation if gestational hypertension/preeclampsia is confirmed. -NST or BPP at your office next week if undelivered.   Thank you for consultation.  If you have any questions or concerns, please contact me the [facility].  Consultation including face-to-face (more than 50%) counseling 30 minutes.     

## 2022-11-06 NOTE — Progress Notes (Signed)
Maternal-Fetal Medicine   Name: Shannon Lopez DOB: March 07, 1991 MRN: [id number] Referring Provider: Nigel Bridgeman, CNM  I had the pleasure of seeing Shannon [facility] today at the [facility]. She is G3 P0020 at 36w 1d gestation and is here for fetal growth assessment and consultation. She has gestational diabetes and takes metformin XR 1,000 mg twice daily.  She reports her fasting levels range from 95-110 mg/DL and postprandial levels are within normal range. Her blood pressures have been normal at prenatal visits. Medications: Low-dose aspirin, metformin, prenatal vitamins. Allergies: No known drug allergies.  Patient reports that on cell-free fetal DNA screening, the risks of fetal aneuploidies are not increased.  Ultrasound On today's ultrasound, the estimated fetal weight is at the 89 percentile and the abdominal circumference measurement is at the 99 percentile.  Amniotic fluid is normal and good fetal activity seen.  Breech presentation.  Antenatal testing is reassuring.  BPP 8/8. Fetal anatomical survey is very difficult to evaluate because of advanced gestational age.  Blood pressures today at our office were 141/87 mmHg.  Repeat blood pressures were 160/89 and 148/90 mmHg.  Gestational diabetes I discussed blood sugar parameters patient understands that fasting levels are still high.  She takes metformin 2,000 mg daily.  Applications of poorly controlled diabetes include fetal macrosomia and neurological injuries at birth.  Stillbirth is increased with poorly controlled diabetes.  She had 3 blood pressure readings and it is likely that she has gestational hypertension.  I recommended evaluation at the MAU for series of blood pressures.  Patient reported that she would like to check blood pressures at home and call your office if persistent hypertension is seen. We discussed timing of delivery.  To control fasting blood glucose levels, she would require insulin. Delivery  may be considered at 37 weeks' gestation if fasting levels are not within normal range.  If gestational hypertension is confirmed, she should be delivered at 37 weeks.  Breech presentation: Patient understands that cesarean delivery will be recommended if breech presentation persists. External cephalic version may be tried at 37 weeks provided she does not have severe range hypertension.   Patient had questions about cesarean delivery.  I explained cesarean delivery and possible complications including hemorrhage, infection and venous thromboembolism.  I discussed the findings and recommendations with Shannon Hands, NP.  Recommendations -Consider delivery at [redacted] weeks gestation if diabetes not well-controlled. -Recommend delivery at [redacted] weeks gestation if gestational hypertension/preeclampsia is confirmed. -NST or BPP at your office next week if undelivered.   Thank you for consultation.  If you have any questions or concerns, please contact me the [facility].  Consultation including face-to-face (more than 50%) counseling 30 minutes.

## 2022-11-17 ENCOUNTER — Other Ambulatory Visit: Payer: Self-pay | Admitting: Obstetrics and Gynecology

## 2022-11-19 ENCOUNTER — Encounter (HOSPITAL_COMMUNITY): Payer: Self-pay

## 2022-11-19 NOTE — Patient Instructions (Signed)
Stevie Ertle  11/19/2022   Your procedure is scheduled on:  12/03/2022  Arrive at 0800 at Entrance C on [employer] [address] at [facility]  and [address]. You are invited to use the FREE valet parking or use the Visitor's parking deck.  Pick up the phone at the desk and dial [phone].  Call this number if you have problems the morning of surgery: [phone]  Remember:   Do not eat food:(After Midnight) Desps de medianoche.  Do not drink clear liquids: (After Midnight) Desps de medianoche.  Take these medicines the morning of surgery with A SIP OF WATER:  none   Do not wear jewelry, make-up or nail polish.  Do not wear lotions, powders, or perfumes. Do not wear deodorant.  Do not shave 48 hours prior to surgery.  Do not bring valuables to the hospital.  [facility] is not   responsible for any belongings or valuables brought to the hospital.  Contacts, dentures or bridgework may not be worn into surgery.  Leave suitcase in the car. After surgery it may be brought to your room.  For patients admitted to the hospital, checkout time is 11:00 AM the day of              discharge.      Please read over the following fact sheets that you were given:     Preparing for Surgery

## 2022-12-01 ENCOUNTER — Encounter (HOSPITAL_COMMUNITY)
Admission: RE | Admit: 2022-12-01 | Discharge: 2022-12-01 | Disposition: A | Discharge status: Home / Self Care | Payer: Medicaid Other | Source: Ambulatory Visit | Attending: Obstetrics and Gynecology | Admitting: Obstetrics and Gynecology

## 2022-12-01 VITALS — Ht 65.0 in | Wt 316.0 lb

## 2022-12-01 DIAGNOSIS — O321XX0 Maternal care for breech presentation, not applicable or unspecified: Secondary | ICD-10-CM | POA: Insufficient documentation

## 2022-12-01 DIAGNOSIS — Z3A39 39 weeks gestation of pregnancy: Secondary | ICD-10-CM | POA: Insufficient documentation

## 2022-12-01 LAB — BASIC METABOLIC PANEL
Anion gap: 9 (ref 5–15)
BUN: 17 mg/dL (ref 6–20)
CO2: 18 mmol/L — ABNORMAL LOW (ref 22–32)
Calcium: 9.3 mg/dL (ref 8.9–10.3)
Chloride: 107 mmol/L (ref 98–111)
Creatinine, Ser: 0.71 mg/dL (ref 0.44–1.00)
GFR, Estimated: >60 mL/min (ref >60)
Glucose, Bld: 155 mg/dL — ABNORMAL HIGH (ref 70–99)
Potassium: 4.2 mmol/L (ref 3.5–5.1)
Sodium: 134 mmol/L — ABNORMAL LOW (ref 135–145)

## 2022-12-01 LAB — CBC
HCT: 39 % (ref 36.0–46.0)
Hemoglobin: 13.2 g/dL (ref 12.0–15.0)
MCH: 29.5 pg (ref 26.0–34.0)
MCHC: 33.8 g/dL (ref 30.0–36.0)
MCV: 87.2 fL (ref 80.0–100.0)
Platelets: 204 10*3/uL (ref 150–400)
RBC: 4.47 MIL/uL (ref 3.87–5.11)
RDW: 14.3 % (ref 11.5–15.5)
WBC: 5.9 10*3/uL (ref 4.0–10.5)
nRBC: 0 % (ref 0.0–0.2)

## 2022-12-01 LAB — TYPE AND SCREEN
ABO/RH(D): O POS
Antibody Screen: NEGATIVE

## 2022-12-01 LAB — RPR: RPR Ser Ql: NONREACTIVE

## 2022-12-02 ENCOUNTER — Inpatient Hospital Stay (HOSPITAL_COMMUNITY)
Admission: AD | Admit: 2022-12-02 | Discharge: 2022-12-03 | Disposition: A | Discharge status: Home / Self Care | Payer: Medicaid Other | Attending: Obstetrics and Gynecology | Admitting: Obstetrics and Gynecology

## 2022-12-02 DIAGNOSIS — Z87891 Personal history of nicotine dependence: Secondary | ICD-10-CM | POA: Insufficient documentation

## 2022-12-02 DIAGNOSIS — O99213 Obesity complicating pregnancy, third trimester: Secondary | ICD-10-CM | POA: Insufficient documentation

## 2022-12-02 DIAGNOSIS — O321XX0 Maternal care for breech presentation, not applicable or unspecified: Secondary | ICD-10-CM | POA: Insufficient documentation

## 2022-12-02 DIAGNOSIS — Z3A40 40 weeks gestation of pregnancy: Secondary | ICD-10-CM | POA: Insufficient documentation

## 2022-12-02 DIAGNOSIS — O1493 Unspecified pre-eclampsia, third trimester: Secondary | ICD-10-CM | POA: Insufficient documentation

## 2022-12-02 DIAGNOSIS — R110 Nausea: Secondary | ICD-10-CM

## 2022-12-02 NOTE — MAU Note (Signed)
.  Shannon Lopez is a 32 y.o. at [redacted]w[redacted]d here in MAU reporting: had [facility] appointment today and had high blood pressure in the office; patient reports being told to come to MAU if BP became high at home. Patient reports this is the first instance of high blood pressure in this pregnancy. At home BP was 140s-150s/110s, per patient report.  Patient reports feeling nauseated, denies occurrence of emesis. Patient reports having c-section scheduled 12/03/22 at 1000 with Su Hilt MD due to Breech presentation  Patient denies HA, visual disturbances, RUQ pain and edema.   Denies LOF, VB and endorses + FM.   Onset of complaint: 12/02/22 Pain score: 0/10 Vitals:   12/03/22 0005 12/03/22 0010  BP:    Pulse:    Resp:    Temp:    SpO2: 100% 99%     FHT:160 Lab orders placed from triage:

## 2022-12-02 NOTE — MAU Note (Incomplete)
.  Shannon Lopez is a 32 y.o. at [redacted]w[redacted]d here in MAU reporting: had [facility] appointment today and had high blood pressure in the office; patient reports being told to come to MAU if BP became high at home. Patient reports this is the first instance of high blood pressure in this pregnancy. At home BP was 140s-150s/110s, per patient report.  Patient reports feeling nauseated, denies occurrence of emesis. Patient reports having c-section scheduled 12/03/22 at 1000 with Su Hilt MD due to Breech presentation  Denies LOF, VB and endorses + FM.   Onset of complaint: 12/02/22 Pain score: 0/10 There were no vitals filed for this visit.   FHT:*** Lab orders placed from triage:

## 2022-12-03 ENCOUNTER — Inpatient Hospital Stay (HOSPITAL_COMMUNITY): Payer: Medicaid Other

## 2022-12-03 ENCOUNTER — Other Ambulatory Visit: Payer: Self-pay

## 2022-12-03 ENCOUNTER — Inpatient Hospital Stay (HOSPITAL_COMMUNITY)
Admission: RE | Admit: 2022-12-03 | Discharge: 2022-12-06 | DRG: 788 | Disposition: A | Discharge status: Home / Self Care | Payer: Medicaid Other | Attending: Obstetrics and Gynecology | Admitting: Obstetrics and Gynecology

## 2022-12-03 ENCOUNTER — Encounter (HOSPITAL_COMMUNITY): Payer: Self-pay | Admitting: Obstetrics and Gynecology

## 2022-12-03 ENCOUNTER — Encounter (HOSPITAL_COMMUNITY)
Admission: RE | Disposition: A | Discharge status: Home / Self Care | Payer: Self-pay | Source: Home / Self Care | Attending: Obstetrics and Gynecology

## 2022-12-03 DIAGNOSIS — O99824 Streptococcus B carrier state complicating childbirth: Secondary | ICD-10-CM | POA: Diagnosis present

## 2022-12-03 DIAGNOSIS — O99344 Other mental disorders complicating childbirth: Secondary | ICD-10-CM | POA: Diagnosis present

## 2022-12-03 DIAGNOSIS — J45909 Unspecified asthma, uncomplicated: Secondary | ICD-10-CM | POA: Diagnosis not present

## 2022-12-03 DIAGNOSIS — O24424 Gestational diabetes mellitus in childbirth, insulin controlled: Secondary | ICD-10-CM | POA: Diagnosis not present

## 2022-12-03 DIAGNOSIS — Z3A40 40 weeks gestation of pregnancy: Secondary | ICD-10-CM

## 2022-12-03 DIAGNOSIS — F32A Depression, unspecified: Secondary | ICD-10-CM | POA: Diagnosis present

## 2022-12-03 DIAGNOSIS — Z87891 Personal history of nicotine dependence: Secondary | ICD-10-CM | POA: Diagnosis not present

## 2022-12-03 DIAGNOSIS — R110 Nausea: Secondary | ICD-10-CM

## 2022-12-03 DIAGNOSIS — O321XX0 Maternal care for breech presentation, not applicable or unspecified: Principal | ICD-10-CM | POA: Diagnosis present

## 2022-12-03 DIAGNOSIS — Z98891 History of uterine scar from previous surgery: Secondary | ICD-10-CM

## 2022-12-03 DIAGNOSIS — O134 Gestational [pregnancy-induced] hypertension without significant proteinuria, complicating childbirth: Secondary | ICD-10-CM | POA: Diagnosis present

## 2022-12-03 DIAGNOSIS — O480 Post-term pregnancy: Secondary | ICD-10-CM | POA: Diagnosis not present

## 2022-12-03 DIAGNOSIS — O24425 Gestational diabetes mellitus in childbirth, controlled by oral hypoglycemic drugs: Secondary | ICD-10-CM | POA: Diagnosis present

## 2022-12-03 DIAGNOSIS — O1493 Unspecified pre-eclampsia, third trimester: Secondary | ICD-10-CM

## 2022-12-03 DIAGNOSIS — O99214 Obesity complicating childbirth: Secondary | ICD-10-CM | POA: Diagnosis present

## 2022-12-03 DIAGNOSIS — Z3490 Encounter for supervision of normal pregnancy, unspecified, unspecified trimester: Principal | ICD-10-CM

## 2022-12-03 DIAGNOSIS — O9952 Diseases of the respiratory system complicating childbirth: Secondary | ICD-10-CM

## 2022-12-03 DIAGNOSIS — O99820 Streptococcus B carrier state complicating pregnancy: Secondary | ICD-10-CM | POA: Diagnosis not present

## 2022-12-03 DIAGNOSIS — R030 Elevated blood-pressure reading, without diagnosis of hypertension: Secondary | ICD-10-CM | POA: Diagnosis present

## 2022-12-03 DIAGNOSIS — O99213 Obesity complicating pregnancy, third trimester: Secondary | ICD-10-CM | POA: Diagnosis not present

## 2022-12-03 DIAGNOSIS — O1414 Severe pre-eclampsia complicating childbirth: Secondary | ICD-10-CM | POA: Diagnosis not present

## 2022-12-03 LAB — CBC
HCT: 38.3 % (ref 36.0–46.0)
Hemoglobin: 13.2 g/dL (ref 12.0–15.0)
MCH: 29.3 pg (ref 26.0–34.0)
MCHC: 34.5 g/dL (ref 30.0–36.0)
MCV: 85.1 fL (ref 80.0–100.0)
Platelets: 234 10*3/uL (ref 150–400)
RBC: 4.5 MIL/uL (ref 3.87–5.11)
RDW: 14.4 % (ref 11.5–15.5)
WBC: 5.6 10*3/uL (ref 4.0–10.5)
nRBC: 0 % (ref 0.0–0.2)

## 2022-12-03 LAB — GLUCOSE, CAPILLARY
Glucose-Capillary: 97 mg/dL (ref 70–99)
Glucose-Capillary: 118 mg/dL — ABNORMAL HIGH (ref 70–99)
Glucose-Capillary: 189 mg/dL — ABNORMAL HIGH (ref 70–99)
Glucose-Capillary: 144 mg/dL — ABNORMAL HIGH (ref 70–99)

## 2022-12-03 LAB — URINALYSIS, ROUTINE W REFLEX MICROSCOPIC
Bilirubin Urine: NEGATIVE
Glucose, UA: NEGATIVE mg/dL
Hgb urine dipstick: NEGATIVE
Ketones, ur: NEGATIVE mg/dL
Leukocytes,Ua: NEGATIVE
Nitrite: NEGATIVE
Protein, ur: >=300 mg/dL — AB
Specific Gravity, Urine: 1.012 (ref 1.005–1.030)
pH: 6 (ref 5.0–8.0)

## 2022-12-03 LAB — COMPREHENSIVE METABOLIC PANEL
ALT: 12 U/L (ref 0–44)
AST: 14 U/L — ABNORMAL LOW (ref 15–41)
Albumin: 2.2 g/dL — ABNORMAL LOW (ref 3.5–5.0)
Alkaline Phosphatase: 101 U/L (ref 38–126)
Anion gap: 11 (ref 5–15)
BUN: 12 mg/dL (ref 6–20)
CO2: 18 mmol/L — ABNORMAL LOW (ref 22–32)
Calcium: 8.9 mg/dL (ref 8.9–10.3)
Chloride: 103 mmol/L (ref 98–111)
Creatinine, Ser: 0.58 mg/dL (ref 0.44–1.00)
GFR, Estimated: >60 mL/min (ref >60)
Glucose, Bld: 130 mg/dL — ABNORMAL HIGH (ref 70–99)
Potassium: 4.3 mmol/L (ref 3.5–5.1)
Sodium: 132 mmol/L — ABNORMAL LOW (ref 135–145)
Total Bilirubin: 0.1 mg/dL — ABNORMAL LOW (ref 0.3–1.2)
Total Protein: 5.8 g/dL — ABNORMAL LOW (ref 6.5–8.1)

## 2022-12-03 LAB — PROTEIN / CREATININE RATIO, URINE
Creatinine, Urine: 49 mg/dL
Protein Creatinine Ratio: 4.33 mg/mg{Cre} — ABNORMAL HIGH (ref 0.00–0.15)
Total Protein, Urine: 212 mg/dL

## 2022-12-03 SURGERY — Surgical Case
Anesthesia: Spinal | Site: Abdomen

## 2022-12-03 MED ORDER — FENTANYL CITRATE (PF) 100 MCG/2ML IJ SOLN: 25.0000 ug | INTRAMUSCULAR | Status: DC | PRN

## 2022-12-03 MED ORDER — SCOPOLAMINE 1 MG/3DAYS TD PT72
MEDICATED_PATCH | TRANSDERMAL | Status: AC
  Filled 2022-12-03: qty 1

## 2022-12-03 MED ORDER — MAGNESIUM SULFATE BOLUS VIA INFUSION
4.0000 g | Freq: Once | INTRAVENOUS | Status: AC
  Administered 2022-12-03: 4 g via INTRAVENOUS
  Filled 2022-12-03: qty 1000

## 2022-12-03 MED ORDER — OXYTOCIN-SODIUM CHLORIDE 30-0.9 UT/500ML-% IV SOLN
INTRAVENOUS | Status: DC | PRN
  Administered 2022-12-03: 300 mL via INTRAVENOUS

## 2022-12-03 MED ORDER — DEXMEDETOMIDINE HCL IN NACL 80 MCG/20ML IV SOLN
INTRAVENOUS | Status: DC | PRN
  Administered 2022-12-03: 8 ug via INTRAVENOUS

## 2022-12-03 MED ORDER — PHENYLEPHRINE HCL-NACL 20-0.9 MG/250ML-% IV SOLN
INTRAVENOUS | Status: DC | PRN
  Administered 2022-12-03: 30 ug/min via INTRAVENOUS

## 2022-12-03 MED ORDER — DIPHENHYDRAMINE HCL 25 MG PO CAPS
25.0000 mg | ORAL_CAPSULE | Freq: Four times a day (QID) | ORAL | Status: DC | PRN
  Administered 2022-12-03: 25 mg via ORAL
  Filled 2022-12-03: qty 1

## 2022-12-03 MED ORDER — LACTATED RINGERS IV SOLN: INTRAVENOUS | Status: DC

## 2022-12-03 MED ORDER — CEFAZOLIN IN SODIUM CHLORIDE 3-0.9 GM/100ML-% IV SOLN
3.0000 g | Freq: Once | INTRAVENOUS | Status: AC
  Administered 2022-12-03: 3 g via INTRAVENOUS

## 2022-12-03 MED ORDER — ONDANSETRON HCL 4 MG/2ML IJ SOLN
INTRAMUSCULAR | Status: DC | PRN
  Administered 2022-12-03: 4 mg via INTRAVENOUS

## 2022-12-03 MED ORDER — OXYTOCIN-SODIUM CHLORIDE 30-0.9 UT/500ML-% IV SOLN
INTRAVENOUS | Status: AC
  Filled 2022-12-03: qty 500

## 2022-12-03 MED ORDER — BUPIVACAINE IN DEXTROSE 0.75-8.25 % IT SOLN
INTRATHECAL | Status: DC | PRN
  Administered 2022-12-03: 1.8 mL via INTRATHECAL

## 2022-12-03 MED ORDER — PHENYLEPHRINE 80 MCG/ML (10ML) SYRINGE FOR IV PUSH (FOR BLOOD PRESSURE SUPPORT)
PREFILLED_SYRINGE | INTRAVENOUS | Status: AC
  Filled 2022-12-03: qty 10

## 2022-12-03 MED ORDER — SODIUM CHLORIDE 0.9 % IR SOLN
Status: DC | PRN
  Administered 2022-12-03 (×3): 1

## 2022-12-03 MED ORDER — POVIDONE-IODINE 10 % EX SWAB
2.0000 | Freq: Once | CUTANEOUS | Status: AC
  Administered 2022-12-03: 2 via TOPICAL

## 2022-12-03 MED ORDER — MEPERIDINE HCL 25 MG/ML IJ SOLN: 6.2500 mg | INTRAMUSCULAR | Status: DC | PRN

## 2022-12-03 MED ORDER — DEXMEDETOMIDINE HCL IN NACL 80 MCG/20ML IV SOLN
INTRAVENOUS | Status: AC
  Filled 2022-12-03: qty 20

## 2022-12-03 MED ORDER — CEFAZOLIN IN SODIUM CHLORIDE 3-0.9 GM/100ML-% IV SOLN
INTRAVENOUS | Status: AC
  Filled 2022-12-03: qty 100

## 2022-12-03 MED ORDER — ACETAMINOPHEN 10 MG/ML IV SOLN
INTRAVENOUS | Status: DC | PRN
  Administered 2022-12-03: 1000 mg via INTRAVENOUS

## 2022-12-03 MED ORDER — OXYTOCIN-SODIUM CHLORIDE 30-0.9 UT/500ML-% IV SOLN: 2.5000 [IU]/h | INTRAVENOUS | Status: AC

## 2022-12-03 MED ORDER — SCOPOLAMINE 1 MG/3DAYS TD PT72
1.0000 | MEDICATED_PATCH | TRANSDERMAL | Status: DC
  Administered 2022-12-03: 1.5 mg via TRANSDERMAL

## 2022-12-03 MED ORDER — MENTHOL 3 MG MT LOZG: 1.0000 | LOZENGE | OROMUCOSAL | Status: DC | PRN

## 2022-12-03 MED ORDER — SIMETHICONE 80 MG PO CHEW
80.0000 mg | CHEWABLE_TABLET | Freq: Three times a day (TID) | ORAL | Status: DC
  Administered 2022-12-03 – 2022-12-06 (×8): 80 mg via ORAL
  Filled 2022-12-03 (×9): qty 1

## 2022-12-03 MED ORDER — DEXAMETHASONE SODIUM PHOSPHATE 10 MG/ML IJ SOLN
INTRAMUSCULAR | Status: DC | PRN
  Administered 2022-12-03: 4 mg via INTRAVENOUS

## 2022-12-03 MED ORDER — FENTANYL CITRATE (PF) 100 MCG/2ML IJ SOLN: 100.0000 ug | INTRAMUSCULAR | Status: DC | PRN

## 2022-12-03 MED ORDER — ONDANSETRON 4 MG PO TBDP
4.0000 mg | ORAL_TABLET | Freq: Once | ORAL | Status: AC
  Administered 2022-12-03: 4 mg via ORAL
  Filled 2022-12-03: qty 1

## 2022-12-03 MED ORDER — FENTANYL CITRATE (PF) 100 MCG/2ML IJ SOLN
INTRAMUSCULAR | Status: AC
  Filled 2022-12-03: qty 2

## 2022-12-03 MED ORDER — MAGNESIUM SULFATE 40 GM/1000ML IV SOLN
2.0000 g/h | INTRAVENOUS | Status: AC
  Administered 2022-12-03 – 2022-12-04 (×2): 2 g/h via INTRAVENOUS
  Filled 2022-12-03: qty 1000

## 2022-12-03 MED ORDER — IBUPROFEN 600 MG PO TABS
600.0000 mg | ORAL_TABLET | Freq: Four times a day (QID) | ORAL | Status: DC
  Administered 2022-12-04 – 2022-12-06 (×8): 600 mg via ORAL
  Filled 2022-12-03 (×8): qty 1

## 2022-12-03 MED ORDER — ONDANSETRON HCL 4 MG/2ML IJ SOLN
INTRAMUSCULAR | Status: AC
  Filled 2022-12-03: qty 2

## 2022-12-03 MED ORDER — KETOROLAC TROMETHAMINE 30 MG/ML IJ SOLN
30.0000 mg | Freq: Four times a day (QID) | INTRAMUSCULAR | Status: AC
  Administered 2022-12-03 – 2022-12-04 (×4): 30 mg via INTRAVENOUS
  Filled 2022-12-03 (×4): qty 1

## 2022-12-03 MED ORDER — ENOXAPARIN SODIUM 80 MG/0.8ML IJ SOSY
75.0000 mg | PREFILLED_SYRINGE | INTRAMUSCULAR | Status: DC
  Administered 2022-12-04 – 2022-12-06 (×3): 75 mg via SUBCUTANEOUS
  Filled 2022-12-03 (×3): qty 0.8

## 2022-12-03 MED ORDER — PHENYLEPHRINE HCL-NACL 20-0.9 MG/250ML-% IV SOLN
INTRAVENOUS | Status: AC
  Filled 2022-12-03: qty 250

## 2022-12-03 MED ORDER — PRENATAL MULTIVITAMIN CH
1.0000 | ORAL_TABLET | Freq: Every day | ORAL | Status: DC
  Administered 2022-12-03 – 2022-12-06 (×4): 1 via ORAL
  Filled 2022-12-03 (×4): qty 1

## 2022-12-03 MED ORDER — MORPHINE SULFATE (PF) 0.5 MG/ML IJ SOLN
INTRAMUSCULAR | Status: AC
  Filled 2022-12-03: qty 10

## 2022-12-03 MED ORDER — ALBUTEROL SULFATE (5 MG/ML) 0.5% IN NEBU: 2.5000 mg | INHALATION_SOLUTION | Freq: Four times a day (QID) | RESPIRATORY_TRACT | Status: DC | PRN

## 2022-12-03 MED ORDER — STERILE WATER FOR IRRIGATION IR SOLN
Status: DC | PRN
  Administered 2022-12-03: 1000 mL

## 2022-12-03 MED ORDER — MAGNESIUM SULFATE 40 GM/1000ML IV SOLN
INTRAVENOUS | Status: AC
  Filled 2022-12-03: qty 1000

## 2022-12-03 MED ORDER — MORPHINE SULFATE (PF) 0.5 MG/ML IJ SOLN
INTRAMUSCULAR | Status: DC | PRN
  Administered 2022-12-03: 150 ug via INTRATHECAL

## 2022-12-03 MED ORDER — SIMETHICONE 80 MG PO CHEW: 80.0000 mg | CHEWABLE_TABLET | ORAL | Status: DC | PRN

## 2022-12-03 MED ORDER — ACETAMINOPHEN 500 MG PO TABS
1000.0000 mg | ORAL_TABLET | Freq: Four times a day (QID) | ORAL | Status: DC
  Administered 2022-12-03 – 2022-12-06 (×12): 1000 mg via ORAL
  Filled 2022-12-03 (×12): qty 2

## 2022-12-03 MED ORDER — SENNOSIDES-DOCUSATE SODIUM 8.6-50 MG PO TABS
2.0000 | ORAL_TABLET | Freq: Every day | ORAL | Status: DC
  Administered 2022-12-04 – 2022-12-06 (×3): 2 via ORAL
  Filled 2022-12-03 (×3): qty 2

## 2022-12-03 MED ORDER — WITCH HAZEL-GLYCERIN EX PADS: 1.0000 | MEDICATED_PAD | CUTANEOUS | Status: DC | PRN

## 2022-12-03 MED ORDER — ZOLPIDEM TARTRATE 5 MG PO TABS: 5.0000 mg | ORAL_TABLET | Freq: Every evening | ORAL | Status: DC | PRN

## 2022-12-03 MED ORDER — ACETAMINOPHEN 10 MG/ML IV SOLN
INTRAVENOUS | Status: AC
  Filled 2022-12-03: qty 100

## 2022-12-03 MED ORDER — FENTANYL CITRATE (PF) 100 MCG/2ML IJ SOLN
INTRAMUSCULAR | Status: DC | PRN
  Administered 2022-12-03: 15 ug via INTRATHECAL

## 2022-12-03 MED ORDER — COCONUT OIL OIL
1.0000 | TOPICAL_OIL | Status: DC | PRN
  Administered 2022-12-03 – 2022-12-05 (×2): 1 via TOPICAL

## 2022-12-03 MED ORDER — DIBUCAINE (PERIANAL) 1 % EX OINT: 1.0000 | TOPICAL_OINTMENT | CUTANEOUS | Status: DC | PRN

## 2022-12-03 SURGICAL SUPPLY — 38 items
APL PRP STRL LF DISP 70% ISPRP (MISCELLANEOUS) ×2
APL SKNCLS STERI-STRIP NONHPOA (GAUZE/BANDAGES/DRESSINGS) ×1
BENZOIN TINCTURE PRP APPL 2/3 (GAUZE/BANDAGES/DRESSINGS) ×1 IMPLANT
CHLORAPREP W/TINT 26 (MISCELLANEOUS) ×2 IMPLANT
CLAMP UMBILICAL CORD (MISCELLANEOUS) ×1 IMPLANT
CLOTH BEACON ORANGE TIMEOUT ST (SAFETY) ×1 IMPLANT
DRSG OPSITE POSTOP 4X10 (GAUZE/BANDAGES/DRESSINGS) ×1 IMPLANT
ELECT REM PT RETURN 9FT ADLT (ELECTROSURGICAL) ×1
ELECTRODE REM PT RTRN 9FT ADLT (ELECTROSURGICAL) ×1 IMPLANT
EXTRACTOR VACUUM M CUP 4 TUBE (SUCTIONS) IMPLANT
GAUZE PAD ABD 7.5X8 STRL (GAUZE/BANDAGES/DRESSINGS) IMPLANT
GAUZE SPONGE 4X4 12PLY STRL LF (GAUZE/BANDAGES/DRESSINGS) IMPLANT
GLOVE BIO SURGEON STRL SZ7.5 (GLOVE) ×1 IMPLANT
GLOVE BIOGEL PI IND STRL 7.0 (GLOVE) ×1 IMPLANT
GLOVE BIOGEL PI IND STRL 7.5 (GLOVE) ×1 IMPLANT
GOWN STRL REUS W/TWL LRG LVL3 (GOWN DISPOSABLE) ×2 IMPLANT
KIT ABG SYR 3ML LUER SLIP (SYRINGE) IMPLANT
MAT PREVALON FULL STRYKER (MISCELLANEOUS) IMPLANT
NDL HYPO 25X5/8 SAFETYGLIDE (NEEDLE) IMPLANT
NEEDLE HYPO 25X5/8 SAFETYGLIDE (NEEDLE) IMPLANT
NS IRRIG 1000ML POUR BTL (IV SOLUTION) ×1 IMPLANT
PACK C SECTION WH (CUSTOM PROCEDURE TRAY) ×1 IMPLANT
PAD OB MATERNITY 4.3X12.25 (PERSONAL CARE ITEMS) ×1 IMPLANT
RETRACTOR TRAXI PANNICULUS (MISCELLANEOUS) IMPLANT
RTRCTR C-SECT PINK 25CM LRG (MISCELLANEOUS) ×1 IMPLANT
STRIP CLOSURE SKIN 1/2X4 (GAUZE/BANDAGES/DRESSINGS) ×1 IMPLANT
SUT CHROMIC 2 0 CT 1 (SUTURE) ×1 IMPLANT
SUT MNCRL 0 VIOLET CTX 36 (SUTURE) ×1 IMPLANT
SUT MNCRL AB 3-0 PS2 27 (SUTURE) ×1 IMPLANT
SUT PLAIN 2 0 XLH (SUTURE) ×1 IMPLANT
SUT VIC AB 0 CT1 36 (SUTURE) ×1 IMPLANT
SUT VIC AB 0 CTX 36 (SUTURE) ×3
SUT VIC AB 0 CTX36XBRD ANBCTRL (SUTURE) ×3 IMPLANT
SUT VIC AB 2-0 SH 27 (SUTURE)
SUT VIC AB 2-0 SH 27XBRD (SUTURE) IMPLANT
TOWEL OR 17X24 6PK STRL BLUE (TOWEL DISPOSABLE) ×1 IMPLANT
TRAY FOLEY W/BAG SLVR 14FR LF (SET/KITS/TRAYS/PACK) ×1 IMPLANT
WATER STERILE IRR 1000ML POUR (IV SOLUTION) ×1 IMPLANT

## 2022-12-03 NOTE — Anesthesia Postprocedure Evaluation (Signed)
Anesthesia Post Note  Patient: Shannon Lopez  Procedure(s) Performed: CESAREAN SECTION (Abdomen)     Patient location during evaluation: PACU Anesthesia Type: Spinal Level of consciousness: oriented and awake and alert Pain management: pain level controlled Vital Signs Assessment: post-procedure vital signs reviewed and stable Respiratory status: spontaneous breathing, respiratory function stable and nonlabored ventilation Cardiovascular status: blood pressure returned to baseline and stable Postop Assessment: no headache, no backache, no apparent nausea or vomiting, spinal receding and patient able to bend at knees Anesthetic complications: no   No notable events documented.  Last Vitals:  Vitals:   12/03/22 1230 12/03/22 1245  BP: (!) 121/55 129/64  Pulse: 64 68  Resp: 17 13  Temp: (!) 36.1 C   SpO2: 97% 95%    Last Pain:  Vitals:   12/03/22 1230  TempSrc: Temporal  PainSc:    Pain Goal: Patients Stated Pain Goal: 5 (12/03/22 1215)  LLE Motor Response: Non-purposeful movement (12/03/22 1245) LLE Sensation: Tingling (12/03/22 1245) RLE Motor Response: Non-purposeful movement (12/03/22 1245) RLE Sensation: Tingling (12/03/22 1245) L Sensory Level: L3-Anterior knee, lower leg (12/03/22 1245) R Sensory Level: L3-Anterior knee, lower leg (12/03/22 1245) Epidural/Spinal Function Cutaneous sensation: Able to Discern Pressure (12/03/22 1230), Patient able to flex knees: No (12/03/22 1230), Patient able to lift hips off bed: No (12/03/22 1230), Back pain beyond tenderness at insertion site: No (12/03/22 1230), Progressively worsening motor and/or sensory loss: No (12/03/22 1230), Bowel and/or bladder incontinence post epidural: No (12/03/22 1230)  Kamesha Herne A.

## 2022-12-03 NOTE — MAU Provider Note (Addendum)
Chief Complaint:  Hypertension   Event Date/Time   First Provider Initiated Contact with Patient 12/03/22 0052     HPI: Shannon Lopez is a 32 y.o. G3P0020 at 87w0dwho presents to maternity admissions reporting elevated BP at home.  Has known Gestational Hypertension and is scheduled to have a C/S later this morning.  States they told her if she had any elevation at home to come in.  . She reports good fetal movement, denies LOF, vaginal bleeding, urinary symptoms, dizziness, n/v, diarrhea, constipation or fever/chills.  She denies headache, visual changes or RUQ abdominal pain.  Hypertension This is a recurrent problem. The problem is unchanged. Pertinent negatives include no blurred vision, chest pain or headaches. There are no associated agents to hypertension. Past treatments include nothing. There are no compliance problems.    RN Note: Shannon Lopez is a 32 y.o. at [redacted]w[redacted]d here in MAU reporting: had [facility] appointment today and had high blood pressure in the office; patient reports being told to come to MAU if BP became high at home. Patient reports this is the first instance of high blood pressure in this pregnancy. At home BP was 140s-150s/110s, per patient report.  Patient reports feeling nauseated, denies occurrence of emesis. Patient reports having c-section scheduled 12/03/22 at 1000 with Su Hilt MD due to Breech presentation   Patient denies HA, visual disturbances, RUQ pain and edema.    Denies LOF, VB and endorses + FM.   Past Medical History: Past Medical History:  Diagnosis Date   Allergy    Asthma    Depression 06/18/2015   Morbid obesity with BMI of 40.0-44.9, adult (HCC)     Past obstetric history: OB History  Gravida Para Term Preterm AB Living  3   0 0 2 0  SAB IAB Ectopic Multiple Live Births    2     0    # Outcome Date GA Lbr Len/2nd Weight Sex Delivery Anes PTL Lv  3 Current           2 IAB           1 IAB             Past Surgical History: History  reviewed. No pertinent surgical history.  Family History: Family History  Problem Relation Age of Onset   Hyperlipidemia Father    Heart disease Father    Diabetes Father    Irritable bowel syndrome Father    Diabetes Maternal Grandmother        both sides of the family   Irritable bowel syndrome Sister     Social History: Social History   Tobacco Use   Smoking status: Former    Types: Cigarettes    Quit date: 2015    Years since quitting: 9.3   Smokeless tobacco: Never  Vaping Use   Vaping Use: Never used  Substance Use Topics   Alcohol use: No    Alcohol/week: 0.0 standard drinks of alcohol   Drug use: No    Allergies:  Allergies  Allergen Reactions   Latex Hives   Oxycodone Nausea And Vomiting    Meds:  Medications Prior to Admission  Medication Sig Dispense Refill Last Dose   aspirin EC 81 MG tablet Take 81 mg by mouth daily. Swallow whole.   12/02/2022   Beclomethasone Dipropionate (QVAR IN) Inhale 2 puffs into the lungs at bedtime.   12/02/2022   cetirizine (ZYRTEC) 10 MG tablet Take 10 mg by mouth at bedtime.   12/02/2022  Cholecalciferol (VITAMIN D3 ULTRA STRENGTH) 125 MCG (5000 UT) capsule Take 5,000 Units by mouth daily.   12/02/2022   metFORMIN (GLUCOPHAGE) 500 MG tablet Take 1,000 mg by mouth 2 (two) times daily with a meal.   12/02/2022   Prenatal Vit-Fe Fumarate-FA (PRENATAL VITAMINS PO) Take 2 tablets by mouth daily.   12/02/2022   albuterol (PROVENTIL) (5 MG/ML) 0.5% nebulizer solution Take 0.5 mLs (2.5 mg total) by nebulization every 6 (six) hours as needed for wheezing or shortness of breath. 20 mL 1    cyclobenzaprine (FLEXERIL) 5 MG tablet Take 5 mg by mouth 2 (two) times daily as needed for muscle spasms.      ondansetron (ZOFRAN-ODT) 4 MG disintegrating tablet Take 1 tablet (4 mg total) by mouth every 6 (six) hours as needed for nausea. (Patient not taking: Reported on 12/01/2022) 20 tablet 0    pantoprazole (PROTONIX) 20 MG tablet Take 1 tablet (20  mg total) by mouth daily. (Patient not taking: Reported on 12/01/2022) 30 tablet 0     I have reviewed patient's Past Medical Hx, Surgical Hx, Family Hx, Social Hx, medications and allergies.   ROS:  Review of Systems  Eyes:  Negative for blurred vision.  Cardiovascular:  Negative for chest pain.  Neurological:  Negative for headaches.   Other systems negative  Physical Exam  Patient Vitals for the past 24 hrs:  BP Temp Temp src Pulse Resp SpO2 Height Weight  12/03/22 0040 -- -- -- -- -- 100 % -- --  12/03/22 0035 -- -- -- -- -- 99 % -- --  12/03/22 0031 139/88 -- -- 93 -- -- -- --  12/03/22 0030 -- -- -- -- -- 100 % -- --  12/03/22 0025 -- -- -- -- -- 99 % -- --  12/03/22 0020 -- -- -- -- -- 99 % -- --  12/03/22 0016 (!) 152/85 -- -- 88 -- -- -- --  12/03/22 0015 -- -- -- -- -- 99 % -- --  12/03/22 0010 -- -- -- -- -- 99 % -- --  12/03/22 0005 -- -- -- -- -- 100 % -- --  12/03/22 0002 (!) 147/89 -- -- 90 -- -- -- --  12/03/22 0001 -- 98.1 F (36.7 C) Oral -- 18 99 % -- --  12/02/22 2350 -- -- -- -- -- -- 5\' 5"  (1.651 m) (!) 147 kg   Constitutional: Well-developed, well-nourished female in no acute distress.  Cardiovascular: normal rate and rhythm Respiratory: normal effort GI: Abd soft, non-tender, gravid appropriate for gestational age.   No rebound or guarding. MS: Extremities nontender, no edema, normal ROM Neurologic: Alert and oriented x 4.   DTRs 2+ with no clonus GU: Neg CVAT.    FHT:  Baseline 135 , moderate variability, accelerations present, no decelerations Contractions:  Irregular     Labs: Results for orders placed or performed during the hospital encounter of 12/02/22 (from the past 24 hour(s))  Urinalysis, Routine w reflex microscopic -Urine, Clean Catch     Status: Abnormal   Collection Time: 12/03/22 12:05 AM  Result Value Ref Range   Color, Urine YELLOW YELLOW   APPearance CLEAR CLEAR   Specific Gravity, Urine 1.012 1.005 - 1.030   pH 6.0 5.0 - 8.0    Glucose, UA NEGATIVE NEGATIVE mg/dL   Hgb urine dipstick NEGATIVE NEGATIVE   Bilirubin Urine NEGATIVE NEGATIVE   Ketones, ur NEGATIVE NEGATIVE mg/dL   Protein, ur >=295 (A) NEGATIVE mg/dL   Nitrite NEGATIVE  NEGATIVE   Leukocytes,Ua NEGATIVE NEGATIVE   RBC / HPF 0-5 0 - 5 RBC/hpf   WBC, UA 0-5 0 - 5 WBC/hpf   Bacteria, UA RARE (A) NONE SEEN   Squamous Epithelial / HPF 0-5 0 - 5 /HPF   Mucus PRESENT   Protein / creatinine ratio, urine     Status: Abnormal   Collection Time: 12/03/22 12:05 AM  Result Value Ref Range   Creatinine, Urine 49 mg/dL   Total Protein, Urine 212 mg/dL   Protein Creatinine Ratio 4.33 (H) 0.00 - 0.15 mg/mg[Cre]  Comprehensive metabolic panel     Status: Abnormal   Collection Time: 12/03/22  1:29 AM  Result Value Ref Range   Sodium 132 (L) 135 - 145 mmol/L   Potassium 4.3 3.5 - 5.1 mmol/L   Chloride 103 98 - 111 mmol/L   CO2 18 (L) 22 - 32 mmol/L   Glucose, Bld 130 (H) 70 - 99 mg/dL   BUN 12 6 - 20 mg/dL   Creatinine, Ser 1.30 0.44 - 1.00 mg/dL   Calcium 8.9 8.9 - 86.5 mg/dL   Total Protein 5.8 (L) 6.5 - 8.1 g/dL   Albumin 2.2 (L) 3.5 - 5.0 g/dL   AST 14 (L) 15 - 41 U/L   ALT 12 0 - 44 U/L   Alkaline Phosphatase 101 38 - 126 U/L   Total Bilirubin 0.1 (L) 0.3 - 1.2 mg/dL   GFR, Estimated >78 >46 mL/min   Anion gap 11 5 - 15  CBC     Status: None   Collection Time: 12/03/22  1:29 AM  Result Value Ref Range   WBC 5.6 4.0 - 10.5 K/uL   RBC 4.50 3.87 - 5.11 MIL/uL   Hemoglobin 13.2 12.0 - 15.0 g/dL   HCT 96.2 95.2 - 84.1 %   MCV 85.1 80.0 - 100.0 fL   MCH 29.3 26.0 - 34.0 pg   MCHC 34.5 30.0 - 36.0 g/dL   RDW 32.4 40.1 - 02.7 %   Platelets 234 150 - 400 K/uL   nRBC 0.0 0.0 - 0.2 %    --/--/O POS (05/14 2536)  Imaging:    MAU Course/MDM: I have reviewed the triage vital signs and the nursing notes.   Pertinent labs & imaging results that were available during my care of the patient were reviewed by me and considered in my medical decision  making (see chart for details).      I have reviewed her medical records including past results, notes and treatments.   I have ordered labs and reviewed results. These are normal except for elevated Protein Creatinine Ratio NST reviewed  Treatments in MAU included EFM. Zofran given for nausea with relief  Assessment: Single IUP at [redacted]w[redacted]d Known Preeclampsia Scheduled Cesarean Section at 0945  Plan: Discharge home Preeclampsia precautions Labor precautions and fetal kick counts Follow up in AM for surger Encouraged to return if she develops worsening of symptoms, increase in pain, fever, or other concerning symptoms.    Pt stable at time of discharge.  Wynelle Bourgeois CNM, MSN Certified Nurse-Midwife 12/03/2022 12:52 AM

## 2022-12-03 NOTE — H&P (Signed)
Shannon Lopez is a 32 y.o. female presenting for primary c-section d/t breech. OB History     Gravida  3   Para      Term  0   Preterm  0   AB  2   Living  0      SAB      IAB  2   Ectopic      Multiple      Live Births  0          Past Medical History:  Diagnosis Date   Allergy    Asthma    Depression 06/18/2015   Morbid obesity with BMI of 40.0-44.9, adult [facility])    History reviewed. No pertinent surgical history. Family History: family history includes Diabetes in her father and maternal grandmother; Heart disease in her father; Hyperlipidemia in her father; Irritable bowel syndrome in her father and sister. Social History:  reports that she quit smoking about 9 years ago. Her smoking use included cigarettes. She has never used smokeless tobacco. She reports that she does not drink alcohol and does not use drugs.     Maternal Diabetes: Yes:  Diabetes Type:  Insulin/Medication controlled Genetic Screening: Normal Maternal Ultrasounds/Referrals: Normal Fetal Ultrasounds or other Referrals:  None Maternal Substance Abuse:  No Significant Maternal Medications:  Meds include: Other:  Significant Maternal Lab Results:  Group B Strep positive Number of Prenatal Visits:greater than 3 verified prenatal visits Other Comments:   Pre-eclampsia without severe features  Review of Systems No constitutional symptoms History Exam by:: Charlene Brooke RN Blood pressure (!) 154/97, pulse 77, resp. rate 18, height 5\' 5"  (1.651 m), weight (!) 145.3 kg, last menstrual period 02/16/2022, SpO2 98 %, currently breastfeeding. Exam Physical Exam  Lungs unlabored CV RRR Abdomen gravid, NT Ext no calf tenderness Prenatal labs: ABO, Rh: --/--/O POS (05/14 1610) Antibody: NEG (05/14 0951) Rubella: Immune (12/21 0000) RPR: NON REACTIVE (05/14 0942)  HBsAg: Negative (12/21 0000)  HIV: Non-reactive (12/21 0000)  GBS: Positive/-- (12/21 0000)   Assessment/Plan: P0 at 40 wks  presenting for primary c-section d/t breech presentation.  RBA reviewed with the patient including but not limited to bleeding infection and injury.  Consent s/w.   Purcell Nails 12/03/2022, 9:54 AM

## 2022-12-03 NOTE — Transfer of Care (Signed)
Immediate Anesthesia Transfer of Care Note  Patient: Shannon Lopez  Procedure(s) Performed: CESAREAN SECTION (Abdomen)  Patient Location: PACU  Anesthesia Type:Spinal  Level of Consciousness: awake  Airway & Oxygen Therapy: Patient Spontanous Breathing  Post-op Assessment: Report given to RN and Post -op Vital signs reviewed and stable  Post vital signs: Reviewed and stable  Last Vitals:  Vitals Value Taken Time  BP 107/58 12/03/22 1211  Temp    Pulse 63 12/03/22 1213  Resp 15 12/03/22 1213  SpO2 97 % 12/03/22 1213  Vitals shown include unvalidated device data.  Last Pain:  Vitals:   12/03/22 0847  TempSrc:   PainSc: 0-No pain         Complications: No notable events documented.

## 2022-12-03 NOTE — Anesthesia Procedure Notes (Signed)
Spinal  Patient location during procedure: OR Start time: 12/03/2022 10:23 AM End time: 12/03/2022 10:29 AM Reason for block: surgical anesthesia Staffing Performed: anesthesiologist  Anesthesiologist: Mal Amabile, MD Performed by: Mal Amabile, MD Authorized by: Mal Amabile, MD   Preanesthetic Checklist Completed: patient identified, IV checked, site marked, risks and benefits discussed, surgical consent, monitors and equipment checked, pre-op evaluation and timeout performed Spinal Block Patient position: sitting Prep: DuraPrep and site prepped and draped Patient monitoring: heart rate, cardiac monitor, continuous pulse ox and blood pressure Approach: midline Location: L3-4 Injection technique: single-shot Needle Needle type: Spinocan and Tuohy  Needle gauge: 25 G Needle length: 9 cm Needle insertion depth: 10 cm Assessment Sensory level: T2 Events: high spinal and CSF return Additional Notes Patient tolerated procedure well. Adequate sensory level.

## 2022-12-03 NOTE — Lactation Note (Signed)
This note was copied from a baby's chart. Lactation Consultation Note  Patient Name: Shannon Lopez ZOXWR'U Date: 12/03/2022 Age:31 hours Reason for consult: Initial assessment;Term;Primapara;Maternal endocrine disorder Stimulated baby to wake up for time to feed. Placed baby STS. Baby finally latched after several tries. Baby BF well. No swallows heard not softening slightly noted of breast. Baby not aggressive at the breast suckled easily. Mom excited about baby feeding so well and very excited to see colostrum when hand expression demonstrated colostrum. Newborn feeding habits, behavior, STS, I&O, positioning, support, props, reviewed. Mom encouraged to feed baby 8-12 times/24 hours and with feeding cues.  Educated mom on hypoglycemia s/sx of the baby and to alert nurse since mom had GDM. Answered mom's questions. Praised mom for feeding so well. Encouraged to call for assistance or questions.   Maternal Data Has patient been taught Hand Expression?: Yes Does the patient have breastfeeding experience prior to this delivery?: No  Feeding    LATCH Score Latch: Grasps breast easily, tongue down, lips flanged, rhythmical sucking.  Audible Swallowing: None  Type of Nipple: Everted at rest and after stimulation  Comfort (Breast/Nipple): Soft / non-tender  Hold (Positioning): Assistance needed to correctly position infant at breast and maintain latch.  LATCH Score: 7   Lactation Tools Discussed/Used    Interventions Interventions: Breast feeding basics reviewed;Adjust position;Assisted with latch;Support pillows;Skin to skin;Position options;Breast massage;LC Services brochure;Breast compression;Hand express  Discharge    Consult Status Consult Status: Follow-up Date: 12/04/22 Follow-up type: In-patient    Charyl Dancer 12/03/2022, 11:19 PM

## 2022-12-03 NOTE — Anesthesia Preprocedure Evaluation (Addendum)
Anesthesia Evaluation  Patient identified by MRN, date of birth, ID band Patient awake    Reviewed: Allergy & Precautions, NPO status , Patient's Chart, lab work & pertinent test results  Airway Mallampati: III       Dental no notable dental hx. (+) Teeth Intact, Dental Advisory Given   Pulmonary asthma , former smoker   Pulmonary exam normal breath sounds clear to auscultation       Cardiovascular negative cardio ROS Normal cardiovascular exam Rhythm:Regular Rate:Normal     Neuro/Psych  PSYCHIATRIC DISORDERS  Depression    negative neurological ROS     GI/Hepatic Neg liver ROS,GERD  Medicated,,  Endo/Other  diabetes, Well Controlled, Gestational, Oral Hypoglycemic Agents  Morbid obesityHx/o PCOS  Renal/GU negative Renal ROS  negative genitourinary   Musculoskeletal negative musculoskeletal ROS (+)    Abdominal  (+) + obese  Peds  Hematology negative hematology ROS (+)   Anesthesia Other Findings   Reproductive/Obstetrics (+) Pregnancy Breech presentation GDM                             Anesthesia Physical Anesthesia Plan  ASA: 3  Anesthesia Plan: Spinal and Combined Spinal and Epidural   Post-op Pain Management: Minimal or no pain anticipated and Regional block*   Induction:   PONV Risk Score and Plan: 4 or greater and Treatment may vary due to age or medical condition and Scopolamine patch - Pre-op  Airway Management Planned: Natural Airway  Additional Equipment: None and Fetal Monitoring  Intra-op Plan:   Post-operative Plan:   Informed Consent: I have reviewed the patients History and Physical, chart, labs and discussed the procedure including the risks, benefits and alternatives for the proposed anesthesia with the patient or authorized representative who has indicated his/her understanding and acceptance.     Dental advisory given  Plan Discussed with:  Anesthesiologist and CRNA  Anesthesia Plan Comments:         Anesthesia Quick Evaluation

## 2022-12-03 NOTE — Op Note (Signed)
Cesarean Section Procedure Note  Indications: P0 at 40 wks admitted for c-section d/t breech.  Pre-operative Diagnosis: 1.Breech 2.Gestational Diabetes 3.primary cesarean section   Post-operative Diagnosis: 1.Breech 2.Gestational Diabetes 3.primary cesarean section  Procedure: PRIMARY LOW TRANSVERSE CESAREAN SECTION  Surgeon: Osborn Coho, MD    Assistants: Dr. Sheppard Evens  Anesthesia: Regional  Anesthesiologist: Mal Amabile, MD   Procedure Details  The patient was taken to the operating room secondary to breech presentation after the risks, benefits, complications, treatment options, and expected outcomes were discussed with the patient.  The patient concurred with the proposed plan, giving informed consent which was signed and witnessed. The patient was taken to Operating Room B, identified as Shannon Lopez and the procedure verified as C-Section Delivery. A Time Out was held and the above information confirmed.  After induction of anesthesia by obtaining a spinal, the patient was prepped and draped in the usual sterile manner. A Pfannenstiel skin incision was made and carried down through the subcutaneous tissue to the underlying layer of fascia.  The fascia was incised bilaterally and extended transversely bilaterally with the Mayo scissors. Kocher clamps were placed on the inferior aspect of the fascial incision and the underlying rectus muscle was separated from the fascia. The same was done on the superior aspect of the fascial incision.  The peritoneum was identified, entered bluntly and extended manually.  An Alexis self-retaining retractor was placed.  The utero-vesical peritoneal reflection was incised transversely and the bladder flap was bluntly freed from the lower uterine segment. A low transverse uterine incision was made with the scalpel and extended bilaterally with the bandage scissors.  The infant was delivered in frank breech position without difficulty.  After  the umbilical cord was clamped and cut, the infant was handed to the awaiting pediatricians.  Cord blood was obtained for evaluation.  The placenta was removed intact and appeared to be within normal limits. The uterus was cleared of all clots and debris. The uterine incision was closed with running interlocking sutures of 0 Vicryl x 2 and a third imbricating layer was performed as well with 2-0 monocryl due to and underdeveloped low uterine segment.   Bilateral tubes and ovaries appeared to be within normal limits.  Good hemostasis was noted.  Copious irrigation was performed until clear.  The peritoneum was repaired with 2-0 chromic via a running suture.  The fascia was reapproximated with a running suture of 0 Vicryl. The subcutaneous tissue was reapproximated with 3 interrupted sutures of 2-0 plain.  The skin was reapproximated with a subcuticular suture of 3-0 monocryl.  Steristrips were applied.  Instrument, sponge, and needle counts were correct prior to abdominal closure and at the conclusion of the case.  The patient was awaiting transfer to the recovery room in good condition.  Findings: Live female infant with Apgars 3 at one minute and 8 at five minutes.  Normal appearing bilateral ovaries and fallopian tubes were noted.  Estimated Blood Loss:  521 ml         Drains: foley to gravity 175 cc         Total IV Fluids: 2200 ml         Specimens to Pathology: to parents (they wanted the placenta)         Complications:  None; patient tolerated the procedure well.         Disposition: PACU - hemodynamically stable.         Condition: stable  Attending Attestation: I performed the  procedure.  I was present and scrubbed and the assistant was required due to complexity of anatomy.

## 2022-12-04 ENCOUNTER — Other Ambulatory Visit: Payer: Self-pay

## 2022-12-04 ENCOUNTER — Encounter (HOSPITAL_COMMUNITY): Payer: Self-pay | Admitting: Obstetrics and Gynecology

## 2022-12-04 LAB — CBC
HCT: 35.6 % — ABNORMAL LOW (ref 36.0–46.0)
Hemoglobin: 12 g/dL (ref 12.0–15.0)
MCH: 29.8 pg (ref 26.0–34.0)
MCHC: 33.7 g/dL (ref 30.0–36.0)
MCV: 88.3 fL (ref 80.0–100.0)
Platelets: 222 10*3/uL (ref 150–400)
RBC: 4.03 MIL/uL (ref 3.87–5.11)
RDW: 14.3 % (ref 11.5–15.5)
WBC: 11.9 10*3/uL — ABNORMAL HIGH (ref 4.0–10.5)
nRBC: 0 % (ref 0.0–0.2)

## 2022-12-04 LAB — GLUCOSE, CAPILLARY
Glucose-Capillary: 183 mg/dL — ABNORMAL HIGH (ref 70–99)
Glucose-Capillary: 213 mg/dL — ABNORMAL HIGH (ref 70–99)
Glucose-Capillary: 209 mg/dL — ABNORMAL HIGH (ref 70–99)

## 2022-12-04 LAB — BASIC METABOLIC PANEL
Anion gap: 9 (ref 5–15)
BUN: 13 mg/dL (ref 6–20)
CO2: 19 mmol/L — ABNORMAL LOW (ref 22–32)
Calcium: 7.8 mg/dL — ABNORMAL LOW (ref 8.9–10.3)
Chloride: 99 mmol/L (ref 98–111)
Creatinine, Ser: 0.88 mg/dL (ref 0.44–1.00)
GFR, Estimated: >60 mL/min (ref >60)
Glucose, Bld: 180 mg/dL — ABNORMAL HIGH (ref 70–99)
Potassium: 4.7 mmol/L (ref 3.5–5.1)
Sodium: 127 mmol/L — ABNORMAL LOW (ref 135–145)

## 2022-12-04 MED ORDER — KETOROLAC TROMETHAMINE 30 MG/ML IJ SOLN: 30.0000 mg | Freq: Four times a day (QID) | INTRAMUSCULAR | Status: AC | PRN

## 2022-12-04 MED ORDER — SODIUM CHLORIDE 0.9% FLUSH: 3.0000 mL | INTRAVENOUS | Status: DC | PRN

## 2022-12-04 MED ORDER — NIFEDIPINE ER OSMOTIC RELEASE 30 MG PO TB24
30.0000 mg | ORAL_TABLET | Freq: Every day | ORAL | Status: DC
  Administered 2022-12-04: 30 mg via ORAL
  Filled 2022-12-04 (×2): qty 1

## 2022-12-04 MED ORDER — NALOXONE HCL 4 MG/10ML IJ SOLN: 1.0000 ug/kg/h | INTRAVENOUS | Status: DC | PRN

## 2022-12-04 MED ORDER — NALOXONE HCL 0.4 MG/ML IJ SOLN: 0.4000 mg | INTRAMUSCULAR | Status: DC | PRN

## 2022-12-04 MED ORDER — METFORMIN HCL 500 MG PO TABS
1000.0000 mg | ORAL_TABLET | Freq: Two times a day (BID) | ORAL | Status: DC
  Administered 2022-12-04 – 2022-12-06 (×4): 1000 mg via ORAL
  Filled 2022-12-04 (×4): qty 2

## 2022-12-04 MED ORDER — OXYCODONE HCL 5 MG PO TABS
5.0000 mg | ORAL_TABLET | ORAL | Status: DC | PRN
  Administered 2022-12-04: 10 mg via ORAL
  Administered 2022-12-04: 5 mg via ORAL
  Administered 2022-12-04 – 2022-12-06 (×7): 10 mg via ORAL
  Filled 2022-12-04: qty 2
  Filled 2022-12-04: qty 1
  Filled 2022-12-04 (×7): qty 2

## 2022-12-04 MED ORDER — DIPHENHYDRAMINE HCL 50 MG/ML IJ SOLN: 12.5000 mg | INTRAMUSCULAR | Status: DC | PRN

## 2022-12-04 MED ORDER — ONDANSETRON HCL 4 MG/2ML IJ SOLN
4.0000 mg | Freq: Three times a day (TID) | INTRAMUSCULAR | Status: DC | PRN
  Administered 2022-12-04 (×2): 4 mg via INTRAVENOUS
  Filled 2022-12-04 (×2): qty 2

## 2022-12-04 MED ORDER — DIPHENHYDRAMINE HCL 25 MG PO CAPS: 25.0000 mg | ORAL_CAPSULE | ORAL | Status: DC | PRN

## 2022-12-04 NOTE — Inpatient Diabetes Management (Signed)
Inpatient Diabetes Program Recommendations  AACE/ADA: New Consensus Statement on Inpatient Glycemic Control (2015)  Target Ranges:  Prepandial:   less than 140 mg/dL      Peak postprandial:   less than 180 mg/dL (1-2 hours)      Critically ill patients:  140 - 180 mg/dL   Lab Results  Component Value Date   GLUCAP 144 (H) 12/03/2022    Review of Glycemic Control  Latest Reference Range & Units 12/03/22 17:45 12/03/22 21:56  Glucose-Capillary 70 - 99 mg/dL 161 (H) 096 (H)  (H): Data is abnormally high Diabetes history: GDM Outpatient Diabetes medications: Metformin 1000 mg BID Current orders for Inpatient glycemic control: none Decadron 4 mg x 1   Inpatient Diabetes Program Recommendations:    Consider adding Novolog 0-9 units TID & HS  Thanks, Lujean Rave, MSN, RNC-OB Diabetes Coordinator [phone] (8a-5p)

## 2022-12-04 NOTE — Lactation Note (Signed)
This note was copied from a baby's chart. Lactation Consultation Note  Patient Name: Shannon Lopez ZOXWR'U Date: 12/04/2022 Age:31 hours Reason for consult: Follow-up assessment;1st time breastfeeding;Maternal endocrine disorder  P1, Baby sleeping skin to skin on mother's chest.  Baby recently breastfed for 12 min.  Suggest calling when baby cues for next feeding for LC to assist since mother states baby has been sleepy.    Maternal Data Has patient been taught Hand Expression?: Yes  Feeding Mother's Current Feeding Choice: Breast Milk  LATCH Score Latch: Grasps breast easily, tongue down, lips flanged, rhythmical sucking.  Audible Swallowing: A few with stimulation  Type of Nipple: Everted at rest and after stimulation  Comfort (Breast/Nipple): Soft / non-tender  Hold (Positioning): Assistance needed to correctly position infant at breast and maintain latch.  LATCH Score: 8   Lactation Tools Discussed/Used Tools: Pump Breast pump type: Double-Electric Breast Pump  Interventions Interventions: Education  Discharge Pump: Personal  Consult Status Consult Status: Follow-up Date: 12/04/22 Follow-up type: In-patient    Dahlia Byes [facility] 12/04/2022, 11:46 AM

## 2022-12-04 NOTE — Progress Notes (Signed)
MOB was referred for history of depression. * Referral screened out by Clinical Social Worker because none of the following criteria appear to apply: ~ History of depression during this pregnancy, or of post-partum depression following prior delivery. Per chart review, no concerns of depression noted in prenatal records.  ~ Diagnosis of depression within last 3 years. Per chart review, MOB's depression dates back to 2016.  OR * MOB's symptoms currently being treated with medication and/or therapy. Please contact the Clinical Social Worker if needs arise, by [facility] request, or if MOB scores greater than 9/yes to question 10 on [place] Postpartum Depression Screen.   Celso Sickle, LCSW Clinical Social Worker [facility] Cell#: [phone]

## 2022-12-04 NOTE — Progress Notes (Signed)
Subjective: POD# 1 Information for the patient's newborn:  Shannon Lopez, Shannon Lopez [[id number]]  female   Baby's Name Answar Circumcision yes  Reports feeling good, just tired Feeding: breast Reports tolerating PO and denies N/V, foley removed, ambulating and urinating w/o difficulty  Pain controlled with  PO meds Denies HA/SOB/dizziness  Flatus pass Vaginal bleeding is normal, no clots     Objective:  VS:  Vitals:   12/04/22 0925 12/04/22 1020 12/04/22 1130 12/04/22 1202  BP:    130/66  Pulse:    69  Resp: 19 20 19 18   Temp:    97.6 F (36.4 C)  TempSrc:    Oral  SpO2:    98%  Weight:      Height:        Intake/Output Summary (Last 24 hours) at 12/04/2022 1533 Last data filed at 12/04/2022 1216 Gross per 24 hour  Intake 3135.14 ml  Output 4615 ml  Net -1479.86 ml      Recent Labs    12/03/22 0129 12/04/22 0711  WBC 5.6 11.9*  HGB 13.2 12.0  HCT 38.3 35.6*  PLT 234 222     Blood type: --/--/O POS (05/14 4098) Rubella: Immune (12/21 0000)    Physical Exam:  General: alert, cooperative, and no distress CV: Regular rate and rhythm or without murmur or extra heart sounds Resp: clear Abdomen: soft, nontender, normal bowel sounds Incision:  pressure dressing removed. Honeycomb dressing CDI Perineum:  Uterine Fundus: firm, below umbilicus, nontender Lochia: minimal Ext:  2+ non-pitting edema to BLE   Assessment/Plan: 32 y.o.   POD# 1. [id number]                  Principal Problem:   Pregnancy Active Problems:   S/P primary low transverse C-section   Routine post-op PP care          Advance diet as tolerated Advised warm fluids and ambulation to improve GI motility Breastfeeding support Anticipate D/C POD 2 or 3  Roma Schanz, DNP, CNM 12/04/2022, 3:33 PM

## 2022-12-04 NOTE — Lactation Note (Signed)
This note was copied from a baby's chart. Lactation Consultation Note  Patient Name: Boy Mildrid Myrick UEAVW'U Date: 12/04/2022 Age:31 hours Reason for consult: Follow-up assessment;RN request  P1, Baby latched in football hold on R breast upon entering with [employer] RN assistance. Baby unlatched after 12 min and LC assisted with re-latching on L breast.  Repositioned baby to guide him deeper on breast.  Encouraged mother to call for help as needed. Feed on demand with cues.  Goal 8-12+ times per day after first 24 hrs.  Place baby STS if not cueing.   Maternal Data Has patient been taught Hand Expression?: Yes Does the patient have breastfeeding experience prior to this delivery?: No  Feeding Mother's Current Feeding Choice: Breast Milk  LATCH Score Latch: Repeated attempts needed to sustain latch, nipple held in mouth throughout feeding, stimulation needed to elicit sucking reflex.  Audible Swallowing: A few with stimulation  Type of Nipple: Everted at rest and after stimulation  Comfort (Breast/Nipple): Soft / non-tender  Hold (Positioning): Assistance needed to correctly position infant at breast and maintain latch.  LATCH Score: 7   Lactation Tools Discussed/Used Tools: Pump Breast pump type: Double-Electric Breast Pump  Interventions Interventions: Assisted with latch;Skin to skin;Education  Discharge Pump: Personal  Consult Status Consult Status: Follow-up Date: 12/05/22 Follow-up type: In-patient    Dahlia Byes [facility] 12/04/2022, 1:53 PM

## 2022-12-05 ENCOUNTER — Encounter (HOSPITAL_COMMUNITY): Payer: Self-pay | Admitting: Obstetrics and Gynecology

## 2022-12-05 LAB — GLUCOSE, CAPILLARY
Glucose-Capillary: 90 mg/dL (ref 70–99)
Glucose-Capillary: 127 mg/dL — ABNORMAL HIGH (ref 70–99)
Glucose-Capillary: 111 mg/dL — ABNORMAL HIGH (ref 70–99)

## 2022-12-05 MED ORDER — NIFEDIPINE ER OSMOTIC RELEASE 60 MG PO TB24
60.0000 mg | ORAL_TABLET | Freq: Every day | ORAL | Status: DC
  Administered 2022-12-05 – 2022-12-06 (×2): 60 mg via ORAL
  Filled 2022-12-05 (×2): qty 1

## 2022-12-05 NOTE — Plan of Care (Signed)

## 2022-12-05 NOTE — Lactation Note (Signed)
This note was copied from a baby's chart. Lactation Consultation Note  Patient Name: Shannon Lopez Date: 12/05/2022 Age:31 hours Reason for consult: Mother's request;Difficult latch;Infant weight loss;1st time breastfeeding;Term;Maternal endocrine disorder (-8.27 % weight loss. C/S delivery).  Birth Parent wants to exclusively breastfeed infant, Birth Parent requested LC services due to breast soreness and infant latch being shallow. LC worked on position infant at the breast using the football hold position. Infant latched with depth, Per Birth Parent, no pain or discomfort with latch, infant breastfeed for 15 minutes. Birth Parent not being using DEBP. LC reviewed how to use and Birth Parent was expressing colostrum in breast flanges that she plans to spoon feed to infant when finish pumping. LC gave Birth Parent Mother's Nursing cream to apply to nipples before or after latching infant at the breast and when using DEBP. LC discussed infant's input and output  per Support Person, infant had 7 voids and 7 stools since birth.   Current feeding plan: 1- Continue to BF infant by cues, on demand, 8 to 12+ times within 24 hours, STS. 2- Continue to ask RN/LC for further latch assistance if needed.Birth Parent knows to break latch and re-latch infant at breast if feeling discomfort or pain with latch.  3- Continue to use DEBP every 3 hours for 15 minutes on initial setting and give infant back any EBM that is pumped after latching infant at the breast.  Maternal Data    Feeding Mother's Current Feeding Choice: Breast Milk  LATCH Score Latch: Grasps breast easily, tongue down, lips flanged, rhythmical sucking.  Audible Swallowing: A few with stimulation  Type of Nipple: Everted at rest and after stimulation  Comfort (Breast/Nipple): Soft / non-tender  Hold (Positioning): Assistance needed to correctly position infant at breast and maintain latch.  LATCH Score: 8   Lactation  Tools Discussed/Used Tools: Flanges;Pump Flange Size: 21 Breast pump type: Double-Electric Breast Pump Reason for Pumping: Birth Parent was set up by RN, infant weight loss of -8.27% Pumping frequency: Birth Parent will continue to pump every 3 hours for 15 minutes on inital setting.  Interventions Interventions: Support pillows;Position options;Expressed milk;Adjust position;Breast compression  Discharge    Consult Status Consult Status: Follow-up Date: 12/06/22 Follow-up type: In-patient    Frederico Hamman 12/05/2022, 6:24 PM

## 2022-12-05 NOTE — Progress Notes (Addendum)
Subjective: POD# 2 Information for the patient's newborn:  Shannon Lopez, Shannon Lopez [[id number]]  female   Baby's Name Answar Circumcision yes  Reports feeling sleepy Feeding: breast Reports tolerating PO and denies N/V, ambulating in hallways, and urinating w/o difficulty  Pain controlled with  PO meds Denies HA/SOB/dizziness  Flatus passing Vaginal bleeding is normal, no clots     Objective:  VS:  Vitals:   12/04/22 1600 12/04/22 2124 12/05/22 0002 12/05/22 0334  BP: (!) 158/82 (!) 146/51 (!) 131/51 (!) 141/49  Pulse: 71 63 (!) 57 (!) 59  Resp: 18 (!) 21 20 18   Temp: 98 F (36.7 C) 98 F (36.7 C) 98 F (36.7 C) 98.4 F (36.9 C)  TempSrc: Oral Oral Oral Oral  SpO2: 99% 99%  99%  Weight:      Height:        Intake/Output Summary (Last 24 hours) at 12/05/2022 0857 Last data filed at 12/05/2022 4098 Gross per 24 hour  Intake 483.19 ml  Output 5650 ml  Net -5166.81 ml     Recent Labs    12/03/22 0129 12/04/22 0711  WBC 5.6 11.9*  HGB 13.2 12.0  HCT 38.3 35.6*  PLT 234 222  CBG (last 3)  Recent Labs    12/04/22 1034 12/04/22 1504 12/04/22 2121  GLUCAP 183* 213* 209*     Blood type: --/--/O POS (05/14 1191) Rubella: Immune (12/21 0000)    Physical Exam:  General: alert, cooperative, and no distress CV: Regular rate and rhythm Resp: clear Abdomen: soft, nontender, normal bowel sounds Incision: dry, intact, and serous drainage present Perineum:  Uterine Fundus: firm, below umbilicus, nontender Lochia: minimal Ext:  1+ non-pitting edema, neg for pain, tenderness, and cords   Assessment/Plan: 32 y.o.   POD# 2. [id number]  Primary LTCS for breech GDM - above range values Pre-eclampsia     -S/P mag sulfate    -normotensive to mild range BP                  Principal Problem:   Pregnancy Active Problems:   S/P primary low transverse C-section   Continue routine post-op PP care    Increased Procardia from 30 to 60 mg PO daily Follow-up in office in 1  week for BP check       Breastfeeding support Anticipate D/C 12/06/22 Plan coordinated with Dr. Tresa Moore, DNP, CNM 12/05/2022, 8:57 AM

## 2022-12-06 LAB — GLUCOSE, CAPILLARY
Glucose-Capillary: 101 mg/dL — ABNORMAL HIGH (ref 70–99)
Glucose-Capillary: 118 mg/dL — ABNORMAL HIGH (ref 70–99)

## 2022-12-06 MED ORDER — OXYCODONE HCL 5 MG PO TABS: 5.0000 mg | ORAL_TABLET | ORAL | 0 refills | Status: DC | PRN

## 2022-12-06 MED ORDER — ACETAMINOPHEN 500 MG PO TABS: 1000.0000 mg | ORAL_TABLET | Freq: Four times a day (QID) | ORAL | 0 refills | Status: AC

## 2022-12-06 MED ORDER — IBUPROFEN 600 MG PO TABS: 600.0000 mg | ORAL_TABLET | Freq: Four times a day (QID) | ORAL | 0 refills | Status: DC

## 2022-12-06 MED ORDER — NIFEDIPINE ER 60 MG PO TB24: 60.0000 mg | ORAL_TABLET | Freq: Every day | ORAL | 1 refills | Status: AC

## 2022-12-06 NOTE — Progress Notes (Signed)
Patient discharged home with husband and newborn son.  Discharge instructions, including symptoms of preeclampsia, discussed with patient and husband together.  Both verbalized understanding of medications, activity restrictions, and postpartum warning signs.  Patient to car via wheelchair with Roselind Messier, NT.

## 2022-12-06 NOTE — Progress Notes (Signed)
Patient's blood pressures this afternoon were 149/81 at 13:28 and 150/72 at 15:49.  Patient denies HA, visual disturbances, or RUQ pain.  Patient taking nifedipine 60 mg daily and she had a dose this morning.  Phoned Dr. Normand Sloop with above information.  Dr. Normand Sloop said we may proceed with discharge as ordered leaving medications the same for now. Patient is scheduled for follow-up appointment with Manfred Arch, CNM in the office on 12/08/22 and that changes to medication would be made at that time if needed.  Patient is to phone provider sooner if she begins to have severe features of preeclampsia.  Handout of preeclampsia severe features added to AVS.

## 2022-12-09 NOTE — Discharge Summary (Signed)
Postpartum Discharge Summary  Date of Service updated5/19/24     Patient Name: Shannon Lopez DOB: 1990/09/13 MRN: [id number]  Date of admission: 12/03/2022 Delivery date:12/03/2022  Delivering provider: Osborn Coho  Date of discharge: 12/09/2022  Admitting diagnosis: Pregnancy [Z34.90] S/P primary low transverse C-section [Z98.891] Intrauterine pregnancy: [redacted]w[redacted]d     Secondary diagnosis:  Principal Problem:   Pregnancy Active Problems:   S/P primary low transverse C-section  Additional problems: GHTN Obesity DM    Discharge diagnosis: Term Pregnancy Delivered, Gestational Hypertension, and Type 2 DM                                              Post partum procedures: none Augmentation: N/A Complications: None  Hospital course: Sceduled C/S   32 y.o. yo G3P1021 at [redacted]w[redacted]d was admitted to the hospital 12/03/2022 for scheduled cesarean section with the following indication:Malpresentation.Delivery details are as follows:  Membrane Rupture Time/Date: 11:05 AM ,12/03/2022   Delivery Method:C-Section, Low Transverse  Details of operation can be found in separate operative note.  Patient had a postpartum course complicated bysee above.  She is ambulating, tolerating a regular diet, passing flatus, and urinating well. Patient is discharged home in stable condition on  12/09/22        Newborn Data: Birth date:12/03/2022  Birth time:11:07 AM  Gender:Female  Living status:Living  Apgars:3 ,8  Weight:3980 g     Magnesium Sulfate received: Yes: Seizure prophylaxis BMZ received: No Rhophylac:No MMR:No T-DaP:   Flu: No Transfusion:No  Physical exam  Vitals:   12/06/22 0805 12/06/22 0806 12/06/22 1328 12/06/22 1549  BP:  138/70 (!) 149/81 (!) 150/72  Pulse:  72 75 85  Resp:  16 18   Temp:  98.6 F (37 C) 98.7 F (37.1 C)   TempSrc:  Oral Oral   SpO2: 99%  98%   Weight:      Height:       General: alert and cooperative Lochia: appropriate Uterine Fundus: firm Incision:  Healing well with no significant drainage, No significant erythema, Dressing is clean, dry, and intact DVT Evaluation: No evidence of DVT seen on physical exam. Labs: Lab Results  Component Value Date   WBC 11.9 (H) 12/04/2022   HGB 12.0 12/04/2022   HCT 35.6 (L) 12/04/2022   MCV 88.3 12/04/2022   PLT 222 12/04/2022      Latest Ref Rng & Units 12/04/2022    7:11 AM  CMP  Glucose 70 - 99 mg/dL 657   BUN 6 - 20 mg/dL 13   Creatinine 8.46 - 1.00 mg/dL 9.62   Sodium 952 - 841 mmol/L 127   Potassium 3.5 - 5.1 mmol/L 4.7   Chloride 98 - 111 mmol/L 99   CO2 22 - 32 mmol/L 19   Calcium 8.9 - 10.3 mg/dL 7.8    Edinburgh Score:    12/05/2022    4:21 PM  Edinburgh Postnatal Depression Scale Screening Tool  I have been able to laugh and see the funny side of things. 0  I have looked forward with enjoyment to things. 0  I have blamed myself unnecessarily when things went wrong. 1  I have been anxious or worried for no good reason. 2  I have felt scared or panicky for no good reason. 2  Things have been getting on top of me. 1  I have  been so unhappy that I have had difficulty sleeping. 0  I have felt sad or miserable. 0  I have been so unhappy that I have been crying. 0  The thought of harming myself has occurred to me. 0  Edinburgh Postnatal Depression Scale Total 6      After visit meds:  Allergies as of 12/06/2022       Reactions   Latex Hives        Medication List     STOP taking these medications    metFORMIN 500 MG tablet Commonly known as: GLUCOPHAGE   ondansetron 4 MG disintegrating tablet Commonly known as: ZOFRAN-ODT       TAKE these medications    acetaminophen 500 MG tablet Commonly known as: TYLENOL Take 2 tablets (1,000 mg total) by mouth every 6 (six) hours.   albuterol (5 MG/ML) 0.5% nebulizer solution Commonly known as: PROVENTIL Take 0.5 mLs (2.5 mg total) by nebulization every 6 (six) hours as needed for wheezing or shortness of breath.    aspirin EC 81 MG tablet Take 81 mg by mouth daily. Swallow whole.   cetirizine 10 MG tablet Commonly known as: ZYRTEC Take 10 mg by mouth at bedtime.   cyclobenzaprine 5 MG tablet Commonly known as: FLEXERIL Take 5 mg by mouth 2 (two) times daily as needed for muscle spasms.   ibuprofen 600 MG tablet Commonly known as: ADVIL Take 1 tablet (600 mg total) by mouth every 6 (six) hours.   NIFEdipine 60 MG 24 hr tablet Commonly known as: ADALAT CC Take 1 tablet (60 mg total) by mouth daily.   oxyCODONE 5 MG immediate release tablet Commonly known as: Oxy IR/ROXICODONE Take 1-2 tablets (5-10 mg total) by mouth every 4 (four) hours as needed for severe pain or breakthrough pain.   pantoprazole 20 MG tablet Commonly known as: PROTONIX Take 1 tablet (20 mg total) by mouth daily.   PRENATAL VITAMINS PO Take 2 tablets by mouth daily.   QVAR IN Inhale 2 puffs into the lungs at bedtime.   Vitamin D3 Ultra Strength 125 MCG (5000 UT) capsule Generic drug: Cholecalciferol Take 5,000 Units by mouth daily.         Discharge home in stable condition Infant Feeding: Breast Infant Disposition:home with mother Discharge instruction: per After Visit Summary and Postpartum booklet. Activity: Advance as tolerated. Pelvic rest for 6 weeks.  Diet: carb modified diet Anticipated Birth Control: Unsure Postpartum Appointment:1 week Additional Postpartum F/U: BP check 1 week Future Appointments:No future appointments. Follow up Visit:  Follow-up Information     Osborn Coho, MD. Schedule an appointment as soon as possible for a visit in 1 week(s).   Specialty: Obstetrics and Gynecology Why: Return to [facility] in 1 week for a BP check and then in 6 weeks for your postpartum appointment Contact information: [address] [place] [address] [phone]                     12/09/2022 Michael Litter, MD

## 2022-12-10 ENCOUNTER — Encounter (HOSPITAL_COMMUNITY): Payer: Self-pay | Admitting: Obstetrics and Gynecology

## 2022-12-10 ENCOUNTER — Inpatient Hospital Stay (HOSPITAL_COMMUNITY)
Admission: AD | Admit: 2022-12-10 | Discharge: 2022-12-11 | Disposition: A | Discharge status: Home / Self Care | Payer: Medicaid Other | Attending: Obstetrics and Gynecology | Admitting: Obstetrics and Gynecology

## 2022-12-10 ENCOUNTER — Telehealth (HOSPITAL_COMMUNITY): Payer: Self-pay | Admitting: *Deleted

## 2022-12-10 ENCOUNTER — Inpatient Hospital Stay (HOSPITAL_COMMUNITY): Payer: Medicaid Other

## 2022-12-10 ENCOUNTER — Other Ambulatory Visit: Payer: Self-pay | Admitting: Advanced Practice Midwife

## 2022-12-10 DIAGNOSIS — O9089 Other complications of the puerperium, not elsewhere classified: Secondary | ICD-10-CM | POA: Insufficient documentation

## 2022-12-10 DIAGNOSIS — R109 Unspecified abdominal pain: Secondary | ICD-10-CM | POA: Diagnosis not present

## 2022-12-10 DIAGNOSIS — R0602 Shortness of breath: Secondary | ICD-10-CM | POA: Diagnosis not present

## 2022-12-10 DIAGNOSIS — I2699 Other pulmonary embolism without acute cor pulmonale: Secondary | ICD-10-CM | POA: Diagnosis not present

## 2022-12-10 DIAGNOSIS — Z7982 Long term (current) use of aspirin: Secondary | ICD-10-CM | POA: Diagnosis not present

## 2022-12-10 DIAGNOSIS — Z711 Person with feared health complaint in whom no diagnosis is made: Secondary | ICD-10-CM

## 2022-12-10 DIAGNOSIS — Z79899 Other long term (current) drug therapy: Secondary | ICD-10-CM | POA: Insufficient documentation

## 2022-12-10 LAB — CBC WITH DIFFERENTIAL/PLATELET
Abs Immature Granulocytes: 0.04 10*3/uL (ref 0.00–0.07)
Basophils Absolute: 0 10*3/uL (ref 0.0–0.1)
Basophils Relative: 1 %
Eosinophils Absolute: 0.4 10*3/uL (ref 0.0–0.5)
Eosinophils Relative: 7 %
HCT: 37.1 % (ref 36.0–46.0)
Hemoglobin: 12.4 g/dL (ref 12.0–15.0)
Immature Granulocytes: 1 %
Lymphocytes Relative: 26 %
Lymphs Abs: 1.6 10*3/uL (ref 0.7–4.0)
MCH: 29.9 pg (ref 26.0–34.0)
MCHC: 33.4 g/dL (ref 30.0–36.0)
MCV: 89.4 fL (ref 80.0–100.0)
Monocytes Absolute: 0.7 10*3/uL (ref 0.1–1.0)
Monocytes Relative: 12 %
Neutro Abs: 3.2 10*3/uL (ref 1.7–7.7)
Neutrophils Relative %: 53 %
Platelets: 283 10*3/uL (ref 150–400)
RBC: 4.15 MIL/uL (ref 3.87–5.11)
RDW: 13.9 % (ref 11.5–15.5)
WBC: 6 10*3/uL (ref 4.0–10.5)
nRBC: 0 % (ref 0.0–0.2)

## 2022-12-10 LAB — COMPREHENSIVE METABOLIC PANEL
ALT: 21 U/L (ref 0–44)
AST: 22 U/L (ref 15–41)
Albumin: 2.4 g/dL — ABNORMAL LOW (ref 3.5–5.0)
Alkaline Phosphatase: 92 U/L (ref 38–126)
Anion gap: 10 (ref 5–15)
BUN: 15 mg/dL (ref 6–20)
CO2: 22 mmol/L (ref 22–32)
Calcium: 9 mg/dL (ref 8.9–10.3)
Chloride: 105 mmol/L (ref 98–111)
Creatinine, Ser: 0.75 mg/dL (ref 0.44–1.00)
GFR, Estimated: >60 mL/min (ref >60)
Glucose, Bld: 70 mg/dL (ref 70–99)
Potassium: 4.1 mmol/L (ref 3.5–5.1)
Sodium: 137 mmol/L (ref 135–145)
Total Bilirubin: 0.5 mg/dL (ref 0.3–1.2)
Total Protein: 6.3 g/dL — ABNORMAL LOW (ref 6.5–8.1)

## 2022-12-10 LAB — TROPONIN I (HIGH SENSITIVITY): Troponin I (High Sensitivity): 10 ng/L (ref <18)

## 2022-12-10 LAB — BRAIN NATRIURETIC PEPTIDE: B Natriuretic Peptide: 47.8 pg/mL (ref 0.0–100.0)

## 2022-12-10 LAB — D-DIMER, QUANTITATIVE: D-Dimer, Quant: 9.46 ug/mL-FEU — ABNORMAL HIGH (ref 0.00–0.50)

## 2022-12-10 NOTE — Telephone Encounter (Signed)
Patient reported that she called her OB today because, "I have pain at my surgical site and there is increased redness and odor." RN praised patient for calling OB. Patient has not yet heard back from her provider. RN also advised patient that she can come to [facility]U for evaluation. Patient voiced no other questions or concerns regarding her health at this time. EPDS not done. Patient stated, "I did that with my OB, at the pediatrician's office, and with my well-care nurse. If it's OK, I'd rather not do it again. I am doing fine." Patient voiced no questions or concerns regarding infant at this time. RN reviewed ABCs of safe sleep. Patient verbalized understanding. Patient requested RN email information on hospital's postpartum classes and support groups. Email sent. Deforest Hoyles, RN, 12/10/22, [phone]

## 2022-12-10 NOTE — MAU Provider Note (Signed)
History     [id number]  Arrival date and time: 12/10/22 1934    Chief Complaint  Patient presents with   Shortness of Breath     HPI Shannon Lopez is a 32 y.o. G3P1021 at 1 week post partum who presents for incisional pain, abdominal pain, shortness of breath, & chest pain. Reports abdominal pain all week & incision has had abnormal smell. No fever/chills, or drainage. Lochia has decreased over the week. Denies n/v/d, dysuria. Has been having bowel movements; last one this morning.  Shortness of breath & chest pressure started at 645 pm today. Felt like something sitting on her chest which has since resolved. Continues to have shortness of breath. Has also noticed increased BLE swelling. Denies calf pain, cough, fever, wheezing. Has asthma but states this doesn't feel like asthma symptoms. Denies cardiac history.  Had c/section last week due to breech presentation. Received magnesium due to preeclampsia & was discharged on procardia 60 daily (took dose this morning). No headache, visual disturbance, or epigastric pain.   --/--/O POS (05/14 0951)  OB History     Gravida  3   Para  1   Term  1   Preterm  0   AB  2   Living  1      SAB      IAB  2   Ectopic      Multiple  0   Live Births  1           Past Medical History:  Diagnosis Date   Allergy    Asthma    Depression 06/18/2015   Morbid obesity with BMI of 40.0-44.9, adult [facility])     Past Surgical History:  Procedure Laterality Date   CESAREAN SECTION N/A 12/03/2022   Procedure: CESAREAN SECTION;  Surgeon: Osborn Coho, MD;  Location: [facility] LD ORS;  Service: Obstetrics;  Laterality: N/A;    Family History  Problem Relation Age of Onset   Hyperlipidemia Father    Heart disease Father    Diabetes Father    Irritable bowel syndrome Father    Diabetes Maternal Grandmother        both sides of the family   Irritable bowel syndrome Sister     Social History   Socioeconomic History   Marital  status: Married    Spouse name: Not on file   Number of children: 0   Years of education: Not on file   Highest education level: Not on file  Occupational History   Occupation: student  Tobacco Use   Smoking status: Former    Types: Cigarettes    Quit date: 2015    Years since quitting: 9.4   Smokeless tobacco: Never  Vaping Use   Vaping Use: Never used  Substance and Sexual Activity   Alcohol use: No    Alcohol/week: 0.0 standard drinks of alcohol   Drug use: No   Sexual activity: Not Currently    Comment: last IC withinin past week  Other Topics Concern   Not on file  Social History Narrative   Not on file   Social Determinants of Health   Financial Resource Strain: Not on file  Food Insecurity: No Food Insecurity (12/03/2022)   Hunger Vital Sign    Worried About Running Out of Food in the Last Year: Never true    Ran Out of Food in the Last Year: Never true  Transportation Needs: No Transportation Needs (12/03/2022)   PRAPARE - Transportation  Lack of Transportation (Medical): No    Lack of Transportation (Non-Medical): No  Physical Activity: Not on file  Stress: Not on file  Social Connections: Not on file  Intimate Partner Violence: Not on file    Allergies  Allergen Reactions   Latex Hives    No current facility-administered medications on file prior to encounter.   Current Outpatient Medications on File Prior to Encounter  Medication Sig Dispense Refill   acetaminophen (TYLENOL) 500 MG tablet Take 2 tablets (1,000 mg total) by mouth every 6 (six) hours. 30 tablet 0   albuterol (PROVENTIL) (5 MG/ML) 0.5% nebulizer solution Take 0.5 mLs (2.5 mg total) by nebulization every 6 (six) hours as needed for wheezing or shortness of breath. 20 mL 1   aspirin EC 81 MG tablet Take 81 mg by mouth daily. Swallow whole.     Beclomethasone Dipropionate (QVAR IN) Inhale 2 puffs into the lungs at bedtime.     cetirizine (ZYRTEC) 10 MG tablet Take 10 mg by mouth at  bedtime.     Cholecalciferol (VITAMIN D3 ULTRA STRENGTH) 125 MCG (5000 UT) capsule Take 5,000 Units by mouth daily.     cyclobenzaprine (FLEXERIL) 5 MG tablet Take 5 mg by mouth 2 (two) times daily as needed for muscle spasms.     ibuprofen (ADVIL) 600 MG tablet Take 1 tablet (600 mg total) by mouth every 6 (six) hours. 30 tablet 0   NIFEdipine (ADALAT CC) 60 MG 24 hr tablet Take 1 tablet (60 mg total) by mouth daily. 30 tablet 1   oxyCODONE (OXY IR/ROXICODONE) 5 MG immediate release tablet Take 1-2 tablets (5-10 mg total) by mouth every 4 (four) hours as needed for severe pain or breakthrough pain. 30 tablet 0   pantoprazole (PROTONIX) 20 MG tablet Take 1 tablet (20 mg total) by mouth daily. (Patient not taking: Reported on 12/01/2022) 30 tablet 0   Prenatal Vit-Fe Fumarate-FA (PRENATAL VITAMINS PO) Take 2 tablets by mouth daily.       ROS Pertinent positives and negative per HPI, all others reviewed and negative  Physical Exam   BP (!) 141/66   Pulse 88   Temp 98.1 F (36.7 C) (Oral)   Resp (!) 26   Ht 5\' 5"  (1.651 m)   Wt (!) 138.7 kg   SpO2 99%   Breastfeeding Yes   BMI 50.89 kg/m   Patient Vitals for the past 24 hrs:  BP Temp Temp src Pulse Resp SpO2 Height Weight  12/10/22 2251 -- -- -- -- -- 99 % -- --  12/10/22 2200 (!) 141/66 -- -- 88 -- -- -- --  12/10/22 2145 (!) 136/59 -- -- 74 -- -- -- --  12/10/22 2130 135/68 -- -- 84 -- -- -- --  12/10/22 2115 132/66 -- -- 85 -- 97 % -- --  12/10/22 2103 135/74 -- -- 77 -- -- -- --  12/10/22 2100 -- -- -- -- -- 99 % -- --  12/10/22 2054 -- -- -- -- -- 99 % -- --  12/10/22 2051 (!) 140/80 -- -- 89 -- -- -- --  12/10/22 1957 (!) 155/89 98.1 F (36.7 C) Oral 90 (!) 26 100 % 5\' 5"  (1.651 m) (!) 138.7 kg    Physical Exam Vitals and nursing note reviewed.  Constitutional:      General: She is not in acute distress.    Appearance: She is well-developed. She is not ill-appearing.  HENT:     Head: Normocephalic and atraumatic.  Eyes:     General: No scleral icterus.       Right eye: No discharge.        Left eye: No discharge.     Conjunctiva/sclera: Conjunctivae normal.  Cardiovascular:     Rate and Rhythm: Normal rate and regular rhythm.     Heart sounds: Normal heart sounds.  Pulmonary:     Effort: Pulmonary effort is normal. No respiratory distress.     Breath sounds: Normal breath sounds. No wheezing.  Abdominal:     Palpations: Abdomen is soft.     Tenderness: There is no guarding or rebound.     Comments: Incision intact, no redness or drainage. Steri strips dirty - removed.   Musculoskeletal:     Right lower leg: 2+ Pitting Edema present.     Left lower leg: 2+ Pitting Edema present.  Neurological:     General: No focal deficit present.     Mental Status: She is alert.     Deep Tendon Reflexes:     Reflex Scores:      Patellar reflexes are 2+ on the left side.    Comments: No clonus  Psychiatric:        Mood and Affect: Mood normal.        Behavior: Behavior normal.        Labs Results for orders placed or performed during the hospital encounter of 12/10/22 (from the past 24 hour(s))  Comprehensive metabolic panel     Status: Abnormal   Collection Time: 12/10/22  8:51 PM  Result Value Ref Range   Sodium 137 135 - 145 mmol/L   Potassium 4.1 3.5 - 5.1 mmol/L   Chloride 105 98 - 111 mmol/L   CO2 22 22 - 32 mmol/L   Glucose, Bld 70 70 - 99 mg/dL   BUN 15 6 - 20 mg/dL   Creatinine, Ser 1.61 0.44 - 1.00 mg/dL   Calcium 9.0 8.9 - 09.6 mg/dL   Total Protein 6.3 (L) 6.5 - 8.1 g/dL   Albumin 2.4 (L) 3.5 - 5.0 g/dL   AST 22 15 - 41 U/L   ALT 21 0 - 44 U/L   Alkaline Phosphatase 92 38 - 126 U/L   Total Bilirubin 0.5 0.3 - 1.2 mg/dL   GFR, Estimated >04 >54 mL/min   Anion gap 10 5 - 15  D-dimer, quantitative     Status: Abnormal   Collection Time: 12/10/22  8:51 PM  Result Value Ref Range   D-Dimer, Quant 9.46 (H) 0.00 - 0.50 ug/mL-FEU  CBC with Differential/Platelet     Status: None    Collection Time: 12/10/22  8:51 PM  Result Value Ref Range   WBC 6.0 4.0 - 10.5 K/uL   RBC 4.15 3.87 - 5.11 MIL/uL   Hemoglobin 12.4 12.0 - 15.0 g/dL   HCT 09.8 11.9 - 14.7 %   MCV 89.4 80.0 - 100.0 fL   MCH 29.9 26.0 - 34.0 pg   MCHC 33.4 30.0 - 36.0 g/dL   RDW 82.9 56.2 - 13.0 %   Platelets 283 150 - 400 K/uL   nRBC 0.0 0.0 - 0.2 %   Neutrophils Relative % 53 %   Neutro Abs 3.2 1.7 - 7.7 K/uL   Lymphocytes Relative 26 %   Lymphs Abs 1.6 0.7 - 4.0 K/uL   Monocytes Relative 12 %   Monocytes Absolute 0.7 0.1 - 1.0 K/uL   Eosinophils Relative 7 %   Eosinophils Absolute 0.4 0.0 -  0.5 K/uL   Basophils Relative 1 %   Basophils Absolute 0.0 0.0 - 0.1 K/uL   Immature Granulocytes 1 %   Abs Immature Granulocytes 0.04 0.00 - 0.07 K/uL  Brain natriuretic peptide     Status: None   Collection Time: 12/10/22  8:51 PM  Result Value Ref Range   B Natriuretic Peptide 47.8 0.0 - 100.0 pg/mL  Troponin I (High Sensitivity)     Status: None   Collection Time: 12/10/22  8:51 PM  Result Value Ref Range   Troponin I (High Sensitivity) 10 <18 ng/L    Imaging CT Angio Chest Pulmonary Embolism (PE) W or WO Contrast  Result Date: 12/11/2022 CLINICAL DATA:  Suspected pulmonary embolism. EXAM: CT ANGIOGRAPHY CHEST WITH CONTRAST TECHNIQUE: Multidetector CT imaging of the chest was performed using the standard protocol during bolus administration of intravenous contrast. Multiplanar CT image reconstructions and MIPs were obtained to evaluate the vascular anatomy. RADIATION DOSE REDUCTION: This exam was performed according to the departmental dose-optimization program which includes automated exposure control, adjustment of the mA and/or kV according to patient size and/or use of iterative reconstruction technique. CONTRAST:  75mL OMNIPAQUE IOHEXOL 350 MG/ML SOLN COMPARISON:  October 18, 2018 FINDINGS: Cardiovascular: The thoracic aorta is normal in appearance. There is limited evaluation of the subsegmental  pulmonary arteries secondary to overlying areas of respiratory motion artifact. No evidence of pulmonary embolism. Normal heart size. No pericardial effusion. Mediastinum/Nodes: No enlarged mediastinal, hilar, or axillary lymph nodes. Thyroid gland, trachea, and esophagus demonstrate no significant findings. Lungs/Pleura: Lungs are clear. No pleural effusion or pneumothorax. Upper Abdomen: No acute abnormality. Musculoskeletal: No chest wall abnormality. No acute or significant osseous findings. Review of the MIP images confirms the above findings. IMPRESSION: 1. No evidence of pulmonary embolism. 2. No acute intrathoracic process. Electronically Signed   By: Aram Candela M.D.   On: 12/11/2022 00:36   DG Chest 2 View  Result Date: 12/10/2022 CLINICAL DATA:  Shortness of breath. EXAM: CHEST - 2 VIEW COMPARISON:  March 04, 2022 FINDINGS: The heart size and mediastinal contours are within normal limits. Both lungs are clear. The visualized skeletal structures are unremarkable. IMPRESSION: No active cardiopulmonary disease. Electronically Signed   By: Aram Candela M.D.   On: 12/10/2022 20:46    MAU Course  Procedures Lab Orders         Comprehensive metabolic panel         D-dimer, quantitative         CBC with Differential/Platelet         Brain natriuretic peptide    Meds ordered this encounter  Medications   iohexol (OMNIPAQUE) 350 MG/ML injection 75 mL   furosemide (LASIX) 20 MG tablet    Sig: Take 1 tablet (20 mg total) by mouth daily for 5 days.    Dispense:  5 tablet    Refill:  0    Order Specific Question:   Supervising Provider    Answer:   Warden Fillers [[id number]]   Imaging Orders         DG Chest 2 View         CT Angio Chest Pulmonary Embolism (PE) W or WO Contrast     MDM moderate  Assessment and Plan   1. Shortness of breath   2. Postpartum state   3. Worried well      Dispo: discharged to home in stable condition. Rx lasix x 5 days  Return to MAU if  symptoms  worsen  Follow up with OB as scheduled or sooner if needed.   Venia Carbon, NP 12/11/22 1:24 AM  Allergies as of 12/11/2022       Reactions   Latex Hives        Medication List     TAKE these medications    acetaminophen 500 MG tablet Commonly known as: TYLENOL Take 2 tablets (1,000 mg total) by mouth every 6 (six) hours.   albuterol (5 MG/ML) 0.5% nebulizer solution Commonly known as: PROVENTIL Take 0.5 mLs (2.5 mg total) by nebulization every 6 (six) hours as needed for wheezing or shortness of breath.   aspirin EC 81 MG tablet Take 81 mg by mouth daily. Swallow whole.   cetirizine 10 MG tablet Commonly known as: ZYRTEC Take 10 mg by mouth at bedtime.   cyclobenzaprine 5 MG tablet Commonly known as: FLEXERIL Take 5 mg by mouth 2 (two) times daily as needed for muscle spasms.   furosemide 20 MG tablet Commonly known as: Lasix Take 1 tablet (20 mg total) by mouth daily for 5 days.   ibuprofen 600 MG tablet Commonly known as: ADVIL Take 1 tablet (600 mg total) by mouth every 6 (six) hours.   NIFEdipine 60 MG 24 hr tablet Commonly known as: ADALAT CC Take 1 tablet (60 mg total) by mouth daily.   oxyCODONE 5 MG immediate release tablet Commonly known as: Oxy IR/ROXICODONE Take 1-2 tablets (5-10 mg total) by mouth every 4 (four) hours as needed for severe pain or breakthrough pain.   pantoprazole 20 MG tablet Commonly known as: PROTONIX Take 1 tablet (20 mg total) by mouth daily.   PRENATAL VITAMINS PO Take 2 tablets by mouth daily.   QVAR IN Inhale 2 puffs into the lungs at bedtime.   Vitamin D3 Ultra Strength 125 MCG (5000 UT) capsule Generic drug: Cholecalciferol Take 5,000 Units by mouth daily.

## 2022-12-10 NOTE — MAU Note (Signed)
.  Shannon Lopez is a 32 y.o. at [facility] C/S 7 days here in MAU reporting: palpitations, SOB with heaviness on her chest mid line, pain at her incision with redness and odor, HA, intermittent ABD cramping that feels like CTX, swelling her feet, aching pain in her left leg, arm, and shoulder which all symptoms started today around 1000 am.. Pt states she called EMS around 1900 and they took her bp of 166/98, she decided to come POV to be evaluated. Pt denies other PIH s/s. Pt states lochia is decreased. Pt states she is taking her prescribed pain medications, Motrin at 1530, Oxy at 1430, Tylenol at 1530, with little relief.  PreE in pregnancy on Procardia xl 60mg  daily   Onset of complaint: today 1000 Pain score: 6/10 Chest, 6/10 HA, 8/10 ABD, 9/10 incision Vitals:   12/10/22 1957  BP: (!) 155/89  Pulse: 90  Resp: (!) 26  Temp: 98.1 F (36.7 C)  SpO2: 100%      Lab orders placed from triage:

## 2022-12-11 ENCOUNTER — Other Ambulatory Visit (HOSPITAL_COMMUNITY): Payer: Medicaid Other

## 2022-12-11 ENCOUNTER — Inpatient Hospital Stay (HOSPITAL_COMMUNITY): Payer: Medicaid Other

## 2022-12-11 DIAGNOSIS — R0602 Shortness of breath: Secondary | ICD-10-CM

## 2022-12-11 MED ORDER — IOHEXOL 350 MG/ML SOLN
75.0000 mL | Freq: Once | INTRAVENOUS | Status: AC | PRN
  Administered 2022-12-11: 75 mL via INTRAVENOUS

## 2022-12-11 MED ORDER — FUROSEMIDE 20 MG PO TABS: 20.0000 mg | ORAL_TABLET | Freq: Every day | ORAL | 0 refills | Status: DC

## 2023-02-22 ENCOUNTER — Encounter: Payer: Self-pay | Admitting: Endocrinology

## 2023-02-22 ENCOUNTER — Ambulatory Visit: Payer: Medicaid Other | Admitting: Endocrinology

## 2023-02-22 VITALS — BP 130/80 | HR 74 | Ht 65.0 in | Wt 280.6 lb

## 2023-02-22 DIAGNOSIS — Z7984 Long term (current) use of oral hypoglycemic drugs: Secondary | ICD-10-CM

## 2023-02-22 DIAGNOSIS — Z7985 Long-term (current) use of injectable non-insulin antidiabetic drugs: Secondary | ICD-10-CM | POA: Diagnosis not present

## 2023-02-22 DIAGNOSIS — E119 Type 2 diabetes mellitus without complications: Secondary | ICD-10-CM

## 2023-02-22 LAB — POCT GLYCOSYLATED HEMOGLOBIN (HGB A1C): Hemoglobin A1C: 5.4 % (ref 4.0–5.6)

## 2023-02-22 MED ORDER — TIRZEPATIDE 2.5 MG/0.5ML ~~LOC~~ SOAJ: 2.5000 mg | SUBCUTANEOUS | 0 refills | Status: DC

## 2023-02-22 MED ORDER — TIRZEPATIDE 5 MG/0.5ML ~~LOC~~ SOAJ
5.0000 mg | SUBCUTANEOUS | 4 refills | Status: DC
Start: 2023-02-22 — End: 2023-03-02

## 2023-02-22 MED ORDER — METFORMIN HCL 500 MG PO TABS: 500.0000 mg | ORAL_TABLET | Freq: Two times a day (BID) | ORAL | 3 refills | Status: DC

## 2023-02-22 NOTE — Patient Instructions (Addendum)
Decrease metformin to 500 mg two times a day.  Start Mounjaro 2.5 mg weekly for 4 weeks and increase to 5 mg weekly if no side effect.   You should not breast feed.  Also stop the mounjaro when you plan for pregnancy.  Refer to diabetes educator.    Check blood sugar few times a week and bring meter to clinic visit.

## 2023-02-22 NOTE — Progress Notes (Signed)
REASON OF VISIT: New consult for evaluation of type 2 diabetes mellitus  REFERRING PROVIDER: Nigel Bridgeman, CNM   PCP: Nigel Bridgeman, CNM  HISTORY OF PRESENT ILLNESS:   Shannon Lopez is a 32 y.o. old female with past medical history listed below, is here for evaluation of type 2 diabetes mellitus.   Pertinent Diabetes History: Patient states she had prediabetes seen 2021 2022 with hemoglobin A1c in the range of 6%.  In 2022/2024 she was pregnant had delivery in 916.24, she was diagnosed with gestational diabetes mellitus at 34 weeks of gestation, has significantly high blood sugar, and was treated with metformin.  She was on metformin 1000 mg 2 times a day.  She continued metformin after delivery and presented today for further evaluation and management of type 2 diabetes mellitus.  Hemoglobin A1c was 6.5% in January 07, 2023 in the outside facility, records brought by the patient and reviewed in her phone.  Previous diabetes education: No  Chronic Diabetes Complications : Retinopathy: no. Last ophthalmology exam was done, not done.  Nephropathy: no Peripheral neuropathy: no, Coronary artery disease: no Stroke: no  Relevant comorbidities and cardiovascular risk factors: Obesity: yes Body mass index is 46.69 kg/m.  Hypertension: no Hyperlipidemia. no Tobacco smoking:  Social History   Tobacco Use  Smoking Status Former   Current packs/day: 0.00   Types: Cigarettes   Quit date: 2015   Years since quitting: 9.6  Smokeless Tobacco Never   Hypothyroidism: no  Current Diabetic regimen includes: Metformin 1000 mg 2 times a day.  Factors modifying glucose control: 1.  Diabetic diet assessment: She has been watching and following low carbohydrate meals, she has also been practicing fasting several hours in a day.  2.  Staying active or exercising:   3.  Medication compliance: compliant all of the time.  Glycemic data: No glucometer data to review.  Interval history  02/22/23 Patient had a baby in May, she initially breast-feed baby however most recently she has been rarely breast-feeding the baby and baby is mostly formula feed.  She is currently taking metformin 1000 mg 2 times a day.  She denies complaints of blurred vision or numbness and tingling of the feet.  She has been trying to lose weight practicing fasting and also trying to eat healthy diet.  Her BMI 46.  She is interested in weight loss as well.  She has plans to stop breast-feeding completely.  Hemoglobin A1c today 5.4%.  REVIEW OF SYSTEMS As per history of present illness.   PAST MEDICAL HISTORY: Past Medical History:  Diagnosis Date   Allergy    Asthma    Depression 06/18/2015   Morbid obesity with BMI of 40.0-44.9, adult [facility])     PAST SURGICAL HISTORY: Past Surgical History:  Procedure Laterality Date   CESAREAN SECTION N/A 12/03/2022   Procedure: CESAREAN SECTION;  Surgeon: Osborn Coho, MD;  Location: [facility] LD ORS;  Service: Obstetrics;  Laterality: N/A;    ALLERGIES: Allergies  Allergen Reactions   Latex Hives    FAMILY HISTORY:  Family History  Problem Relation Age of Onset   Hyperlipidemia Father    Heart disease Father    Diabetes Father    Irritable bowel syndrome Father    Diabetes Maternal Grandmother        both sides of the family   Irritable bowel syndrome Sister     SOCIAL HISTORY: Social History   Socioeconomic History   Marital status: Married    Spouse name: Not on  file   Number of children: 0   Years of education: Not on file   Highest education level: Not on file  Occupational History   Occupation: student  Tobacco Use   Smoking status: Former    Current packs/day: 0.00    Types: Cigarettes    Quit date: 2015    Years since quitting: 9.6   Smokeless tobacco: Never  Vaping Use   Vaping status: Never Used  Substance and Sexual Activity   Alcohol use: No    Alcohol/week: 0.0 standard drinks of alcohol   Drug use: No   Sexual  activity: Not Currently    Comment: last IC withinin past week  Other Topics Concern   Not on file  Social History Narrative   Not on file   Social Determinants of Health   Financial Resource Strain: Not on file  Food Insecurity: No Food Insecurity (12/03/2022)   Hunger Vital Sign    Worried About Running Out of Food in the Last Year: Never true    Ran Out of Food in the Last Year: Never true  Transportation Needs: No Transportation Needs (12/03/2022)   PRAPARE - Administrator, Civil Service (Medical): No    Lack of Transportation (Non-Medical): No  Physical Activity: Not on file  Stress: Not on file  Social Connections: Not on file    MEDICATIONS:  Current Outpatient Medications  Medication Sig Dispense Refill   acetaminophen (TYLENOL) 500 MG tablet Take 2 tablets (1,000 mg total) by mouth every 6 (six) hours. 30 tablet 0   albuterol (PROVENTIL) (5 MG/ML) 0.5% nebulizer solution Take 0.5 mLs (2.5 mg total) by nebulization every 6 (six) hours as needed for wheezing or shortness of breath. 20 mL 1   aspirin EC 81 MG tablet Take 81 mg by mouth daily. Swallow whole.     Beclomethasone Dipropionate (QVAR IN) Inhale 2 puffs into the lungs at bedtime.     cetirizine (ZYRTEC) 10 MG tablet Take 10 mg by mouth at bedtime.     Cholecalciferol (VITAMIN D3 ULTRA STRENGTH) 125 MCG (5000 UT) capsule Take 5,000 Units by mouth daily.     cyclobenzaprine (FLEXERIL) 5 MG tablet Take 5 mg by mouth 2 (two) times daily as needed for muscle spasms.     NIFEdipine (ADALAT CC) 60 MG 24 hr tablet Take 1 tablet (60 mg total) by mouth daily. 30 tablet 1   Prenatal Vit-Fe Fumarate-FA (PRENATAL VITAMINS PO) Take 2 tablets by mouth daily.     tirzepatide [facility]) 2.5 MG/0.5ML Pen Inject 2.5 mg into the skin once a week. 2 mL 0   tirzepatide (MOUNJARO) 5 MG/0.5ML Pen Inject 5 mg into the skin once a week. 6 mL 4   furosemide (LASIX) 20 MG tablet Take 1 tablet (20 mg total) by mouth daily for 5  days. 5 tablet 0   ibuprofen (ADVIL) 600 MG tablet Take 1 tablet (600 mg total) by mouth every 6 (six) hours. (Patient not taking: Reported on 02/22/2023) 30 tablet 0   metFORMIN (GLUCOPHAGE) 500 MG tablet Take 1 tablet (500 mg total) by mouth 2 (two) times daily. 180 tablet 3   oxyCODONE (OXY IR/ROXICODONE) 5 MG immediate release tablet Take 1-2 tablets (5-10 mg total) by mouth every 4 (four) hours as needed for severe pain or breakthrough pain. (Patient not taking: Reported on 02/22/2023) 30 tablet 0   pantoprazole (PROTONIX) 20 MG tablet Take 1 tablet (20 mg total) by mouth daily. (Patient not taking: Reported  on 02/22/2023) 30 tablet 0   No current facility-administered medications for this visit.    PHYSICAL EXAM: Vitals:   02/22/23 0858  BP: 130/80  Pulse: 74  SpO2: 99%  Weight: 280 lb 9.6 oz (127.3 kg)  Height: 5\' 5"  (1.651 m)   Body mass index is 46.69 kg/m.  @WEIGHT (3)@     General: Well developed, well nourished female in no apparent distress.  HEENT: AT/[place], no external lesions. Hearing intact to the spoken word Eyes: EOMI. Conjunctiva clear and no icterus. Neck: Trachea midline, neck supple  Abdomen: Soft Neurologic: Alert, oriented, normal speech Extremities: No pedal pitting edema Skin: Warm. + acanthosis nigricans Psychiatric: Does not appear depressed or anxiou  Diabetic Foot Exam - Simple   No data filed    LABS Reviewed Lab Results  Component Value Date   HGBA1C 5.4 02/22/2023   No components found for: "MICROALDIP", "CREATURINE", "MICCRERAT"   ASSESSMENT / PLAN  1. Type 2 diabetes mellitus without complication, without long-term current use of insulin (HCC)     Diabetes Mellitus type 2  Patient had prediabetes prior to her recent pregnancy, patient required metformin during the pregnancy and had hemoglobin A1c of 6.5% about 1 month after the pregnancy.  Will treat her as type 2 diabetes mellitus.  She has obesity.  She is interested in weight loss as  well.  Discussed about type 2 diabetes mellitus and chronic complications.  Discussed about importance of controlling diabetes and maintaining controlled blood sugar.  She has no personal history of pancreatitis, no family history of thyroid cancer, MEN2B syndrome.  She has plan to stop breast-feeding completely.  No open rotation of GLP-1 receptor agonist.  Hemoglobin A1c 5.4% today, controlled and congratulated her.  She has been currently taking metformin and also on lifestyle modification mainly with diet plan.  I would like to start on GLP-1 receptor agonist for the weight loss benefit and diabetes control.  She has no plan for pregnancy.  Discussed that Greggory Keen is contraindicated in pregnancy and while breast-feeding.  - Medications:  I) continue metformin 1000 mg 2 times a day. II) start Mounjaro 2.5 mg weekly for 4 weeks and increase to 5 mg weekly.  Discussed potential side effect of Mounjaro. Asked to call our clinic with any questions.  Diabetes educator consult.  - Home glucose testing: Daily fasting. - Discussed/ Gave Hypoglycemia treatment plan - Annual urine for microalbuminuria/ creatinine ratio. - Foot check nightly . - Annual dilated diabetic eye exams, will advise in the future visit. - Diet: Make healthy diabetic food choices - Life style / activity / exercise: Discussed.  2. Blood pressure  -  BP Readings from Last 1 Encounters:  02/22/23 130/80    - Control is in target.  - No change in current plans.  3. Lipid status / Hyperlipidemia -No indication of statin at this time.  Orders placed   Orders Placed This Encounter  Procedures   Consult to diabetes coordinator   POCT glycosylated hemoglobin (Hb A1C)     DISPOSITION Follow up in clinic in 3  months suggested.   All questions answered and patient verbalized understanding of the plan.  Iraq Shahiem Bedwell, MD [facility] [address] [place], [address] [place], [place] [address] Phone # [phone]  At least part of this note was generated using voice recognition software. Inadvertent word errors may have occurred, which were not recognized during the proofreading process.

## 2023-02-23 NOTE — Addendum Note (Signed)
Addended by: Briana Farner, [place] on: 02/23/2023 02:51 PM   Modules accepted: Orders

## 2023-02-23 NOTE — Telephone Encounter (Signed)
Please advise on alternative.  

## 2023-02-24 MED ORDER — SEMAGLUTIDE(0.25 OR 0.5MG/DOS) 2 MG/3ML ~~LOC~~ SOPN: PEN_INJECTOR | SUBCUTANEOUS | 6 refills | Status: DC

## 2023-02-24 NOTE — Telephone Encounter (Signed)
I sent prescription for ozempic, 0.25mg  weekly for 4 weeks and then increase to 0.5mg  weekly.   Iraq Kennedy Brines, MD [facility] [address] [place], [address] [place], [place] [address] Phone # [phone]

## 2023-03-02 ENCOUNTER — Telehealth: Payer: Self-pay | Admitting: Pharmacy Technician

## 2023-03-02 ENCOUNTER — Other Ambulatory Visit (HOSPITAL_COMMUNITY): Payer: Self-pay

## 2023-03-02 MED ORDER — SEMAGLUTIDE(0.25 OR 0.5MG/DOS) 2 MG/3ML ~~LOC~~ SOPN: PEN_INJECTOR | SUBCUTANEOUS | 6 refills | Status: DC

## 2023-03-02 NOTE — Telephone Encounter (Signed)
Pharmacy Patient Advocate Encounter   Received notification from [facility] that prior authorization for Shannon Lopez is required/requested.   Insurance verification completed.   The patient is insured through [facility] [place] .   Per test claim: PA required- Started PA in CoverMyMeds Key: [id number] PA Case ID #: [id number]  Prior Authorization form/request asks a question that requires your assistance. Please see the question below and advise accordingly.   Which TWO preferred GLP-1 agonist medications has the member had an inadequate response to after a therapeutic trial with each? Byetta, Trulicity, Victoza and Ozempic are preferred.

## 2023-03-02 NOTE — Telephone Encounter (Signed)
I ordered Ozempic instead, this is good too. Ozmepic 0.25 mg weekly for 4 weeks and increase to 0.5 mg weekly. Please inform the patient.

## 2023-03-02 NOTE — Telephone Encounter (Signed)
Please see the preferred medications below. I know the patient is wanting Mounjaro.

## 2023-03-03 ENCOUNTER — Telehealth: Payer: Self-pay

## 2023-03-03 ENCOUNTER — Other Ambulatory Visit: Payer: Self-pay

## 2023-03-03 MED ORDER — TIRZEPATIDE 2.5 MG/0.5ML ~~LOC~~ SOAJ: 2.5000 mg | SUBCUTANEOUS | 3 refills | Status: DC

## 2023-03-03 NOTE — Telephone Encounter (Signed)
Disregard request

## 2023-03-03 NOTE — Telephone Encounter (Signed)
Mounjaro needs a PA. 

## 2023-03-04 ENCOUNTER — Other Ambulatory Visit (HOSPITAL_COMMUNITY): Payer: Self-pay

## 2023-03-04 ENCOUNTER — Telehealth: Payer: Self-pay | Admitting: Pharmacy Technician

## 2023-03-04 NOTE — Telephone Encounter (Signed)
Pharmacy Patient Advocate Encounter   Received notification from Pt Calls Messages that prior authorization for Ozempic is required/requested.   Insurance verification completed.   The patient is insured through [facility] .   Per test claim: PA required; PA submitted to [facility] via CoverMyMeds Key/confirmation #/EOC [id number] Status is pending

## 2023-03-04 NOTE — Telephone Encounter (Signed)
PA request has been Submitted. New Encounter created for follow up. For additional info see Pharmacy Prior Auth telephone encounter from 03/04/23 for Ozempic.

## 2023-03-05 NOTE — Telephone Encounter (Signed)
Pharmacy Patient Advocate Encounter  Received notification from [facility] that Prior Authorization for Ozempic has been APPROVED through 03/03/24

## 2023-05-28 ENCOUNTER — Encounter: Payer: Medicaid Other | Attending: Endocrinology | Admitting: Dietician

## 2023-05-28 ENCOUNTER — Other Ambulatory Visit: Payer: Self-pay | Admitting: Endocrinology

## 2023-05-28 ENCOUNTER — Encounter: Payer: Self-pay | Admitting: Endocrinology

## 2023-05-28 DIAGNOSIS — E119 Type 2 diabetes mellitus without complications: Secondary | ICD-10-CM | POA: Diagnosis present

## 2023-05-28 DIAGNOSIS — Z713 Dietary counseling and surveillance: Secondary | ICD-10-CM | POA: Insufficient documentation

## 2023-05-28 DIAGNOSIS — Z7984 Long term (current) use of oral hypoglycemic drugs: Secondary | ICD-10-CM | POA: Diagnosis not present

## 2023-05-28 MED ORDER — SEMAGLUTIDE (1 MG/DOSE) 4 MG/3ML ~~LOC~~ SOPN: 1.0000 mg | PEN_INJECTOR | SUBCUTANEOUS | 4 refills | Status: DC

## 2023-05-28 MED ORDER — DEXCOM G7 SENSOR MISC
1.0000 | 0 refills | Status: DC
Start: 2023-05-28 — End: 2023-07-29

## 2023-05-28 NOTE — Telephone Encounter (Signed)
Sent prescription for Ozmepic 1mg  weekly. Sent prescription for DEXCOm G7 sensor.

## 2023-05-28 NOTE — Patient Instructions (Signed)
1- Continue Physical activity-Great job!  2- Aim for balanced meals on a schedule  1/2 plate of non starchy vegetable, 1/4 protein, 1/4 starchy foods

## 2023-05-28 NOTE — Progress Notes (Signed)
Diabetes Self-Management Education  Visit Type: First/Initial  Appt. Start Time: 1050 Appt. End Time: 1155  05/28/2023  [name], identified by name and date of birth, is a 32 y.o. female with a diagnosis of Diabetes: Type 2.   ASSESSMENT  Patient is here today with her husband and baby. Patient would like to learn about weight reduction. Pt reports she is no longer nursing baby. Patient lives with her with husband and they share shopping and cooking. Pt reports she attempt to avoid carbohydrates. Pt reports making the following lifestyle changes including omission of soda from intake, increasing vegetables and increased physical activity. 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 Pt reports previous GDM. Pt reports no change in appetite since starting weekly GLP medication.   History includes:   Past Medical History:  Diagnosis Date   Allergy    Asthma    Depression 06/18/2015   Morbid obesity with BMI of 40.0-44.9, adult (HCC)     Labs noted:   Lab Results  Component Value Date   HGBA1C 5.4 02/22/2023   No results found for: "CHOL", "HDL", "LDLCALC", "LDLDIRECT", "TRIG", "CHOLHDL"  BP Readings from Last 3 Encounters:  02/22/23 130/80  12/11/22 122/82  12/06/22 (!) 150/72   Medications include:   Current Outpatient Medications:    albuterol (PROVENTIL) (5 MG/ML) 0.5% nebulizer solution, Take 0.5 mLs (2.5 mg total) by nebulization every 6 (six) hours as needed for wheezing or shortness of breath., Disp: 20 mL, Rfl: 1   Beclomethasone Dipropionate (QVAR IN), Inhale 2 puffs into the lungs at bedtime., Disp: , Rfl:    cetirizine (ZYRTEC) 10 MG tablet, Take 10 mg by mouth at bedtime., Disp: , Rfl:    Cholecalciferol (VITAMIN D3 ULTRA STRENGTH) 125 MCG (5000 UT) capsule, Take 5,000 Units by mouth daily., Disp: , Rfl:    cyclobenzaprine (FLEXERIL) 5 MG tablet, Take 5 mg by mouth 2 (two) times daily as needed for muscle spasms., Disp: ,  Rfl:    metFORMIN (GLUCOPHAGE) 500 MG tablet, Take 1 tablet (500 mg total) by mouth 2 (two) times daily., Disp: 180 tablet, Rfl: 3   Prenatal Vit-Fe Fumarate-FA (PRENATAL VITAMINS PO), Take 2 tablets by mouth daily., Disp: , Rfl:    Semaglutide,0.25 or 0.5MG /DOS, 2 MG/3ML SOPN, Take 0.25 mg weekly for 4 weeks and increase to 0.5 mg weekly., Disp: 6 mL, Rfl: 6   acetaminophen (TYLENOL) 500 MG tablet, Take 2 tablets (1,000 mg total) by mouth every 6 (six) hours., Disp: 30 tablet, Rfl: 0   aspirin EC 81 MG tablet, Take 81 mg by mouth daily. Swallow whole. (Patient not taking: Reported on 05/28/2023), Disp: , Rfl:    furosemide (LASIX) 20 MG tablet, Take 1 tablet (20 mg total) by mouth daily for 5 days., Disp: 5 tablet, Rfl: 0   ibuprofen (ADVIL) 600 MG tablet, Take 1 tablet (600 mg total) by mouth every 6 (six) hours. (Patient not taking: Reported on 02/22/2023), Disp: 30 tablet, Rfl: 0   NIFEdipine (ADALAT CC) 60 MG 24 hr tablet, Take 1 tablet (60 mg total) by mouth daily. (Patient not taking: Reported on 05/28/2023), Disp: 30 tablet, Rfl: 1   oxyCODONE (OXY IR/ROXICODONE) 5 MG immediate release tablet, Take 1-2 tablets (5-10 mg total) by mouth every 4 (four) hours as needed for severe pain or breakthrough pain. (Patient not taking: Reported on 02/22/2023), Disp: 30 tablet, Rfl: 0   pantoprazole (PROTONIX) 20 MG tablet, Take 1  tablet (20 mg total) by mouth daily. (Patient not taking: Reported on 02/22/2023), Disp: 30 tablet, Rfl: 0   tirzepatide (MOUNJARO) 2.5 MG/0.5ML Pen, Inject 2.5 mg into the skin once a week. (Patient not taking: Reported on 05/28/2023), Disp: 2 mL, Rfl: 3   Last vitamin D No results found for: "25OHVITD2", "25OHVITD3", "VD25OH"  currently breastfeeding. There is no height or weight on file to calculate BMI.   Diabetes Self-Management Education - 05/28/23 1057       Visit Information   Visit Type First/Initial      Initial Visit   Diabetes Type Type 2    Date Diagnosed March  2024    Are you currently following a meal plan? No    Are you taking your medications as prescribed? Yes      Health Coping   How would you rate your overall health? Fair      Psychosocial Assessment   Patient Belief/Attitude about Diabetes Motivated to manage diabetes    What is the hardest part about your diabetes right now, causing you the most concern, or is the most worrisome to you about your diabetes?   Other (comment)   none   Self-care barriers Unable to determine    Self-management support Doctor's office    Other persons present Patient;Spouse/SO    Patient Concerns Weight Control    Special Needs None    Preferred Learning Style No preference indicated    Learning Readiness Change in progress    How often do you need to have someone help you when you read instructions, pamphlets, or other written materials from your doctor or pharmacy? 1 - Never    What is the last grade level you completed in school? college      Pre-Education Assessment   Patient understands the diabetes disease and treatment process. Needs Instruction    Patient understands incorporating nutritional management into lifestyle. Needs Instruction    Patient undertands incorporating physical activity into lifestyle. Needs Instruction    Patient understands using medications safely. Needs Instruction    Patient understands monitoring blood glucose, interpreting and using results Needs Instruction    Patient understands prevention, detection, and treatment of acute complications. Needs Instruction    Patient understands prevention, detection, and treatment of chronic complications. Needs Instruction    Patient understands how to develop strategies to address psychosocial issues. Needs Instruction    Patient understands how to develop strategies to promote health/change behavior. Needs Instruction      Complications   Last HgB A1C per patient/outside source 5.4 %    How often do you check your blood sugar? 0  times/day (not testing)    Have you had a dilated eye exam in the past 12 months? No    Have you had a dental exam in the past 12 months? Yes    Are you checking your feet? No      Dietary Intake   Breakfast chicken sausage or fish air fried, double shot expresso with ~ 2 cups 2% lactiad    Snack (morning) greek yogurt flavored    Lunch cheese cake factory salad with grilled chicken and dressing    Snack (afternoon) beef jerky or bagel chips with tureky salami, cheese    Dinner romaine cucumber carrots tomatoes cheese croutons onions with ranch or blue cheese, gryo or beef or [place]    Beverage(s) expresso with ~ 2 cups 2% lactiad, water      Activity / Exercise   Activity /  Exercise Type Light (walking / raking leaves)    How many days per week do you exercise? 3    How many minutes per day do you exercise? 60    Total minutes per week of exercise 180      Patient Education   Previous Diabetes Education No    Disease Pathophysiology Definition of diabetes, type 1 and 2, and the diagnosis of diabetes    Healthy Eating Plate Method;Carbohydrate counting;Food label reading, portion sizes and measuring food.;Role of diet in the treatment of diabetes and the relationship between the three main macronutrients and blood glucose level;Meal options for control of blood glucose level and chronic complications.    Being Active Role of exercise on diabetes management, blood pressure control and cardiac health.    Medications Reviewed patients medication for diabetes, action, purpose, timing of dose and side effects.    Monitoring Daily foot exams;Yearly dilated eye exam;Other (comment)   A1C%   Acute complications Taught prevention, symptoms, and  treatment of hypoglycemia - the 15 rule.    Chronic complications Retinopathy and reason for yearly dilated eye exams;Dental care;Lipid levels, blood glucose control and heart disease;Assessed and discussed foot care and prevention of foot problems     Diabetes Stress and Support Identified and addressed patients feelings and concerns about diabetes    Preconception care Role of family planning for patients with diabetes    Lifestyle and Health Coping Helped patient develop diabetes management plan for (enter comment)   weight management     Individualized Goals (developed by patient)   Nutrition Follow meal plan discussed    Physical Activity Exercise 3-5 times per week;30 minutes per day    Medications take my medication as prescribed    Monitoring  Not Applicable    Problem Solving Addressing barriers to behavior change    Reducing Risk do foot checks daily    Health Coping Discuss barriers to diabetes care with support person/system (comment specifics as needed)      Post-Education Assessment   Patient understands the diabetes disease and treatment process. Needs Review    Patient understands incorporating nutritional management into lifestyle. Comprehends key points    Patient undertands incorporating physical activity into lifestyle. Comprehends key points    Patient understands using medications safely. Comphrehends key points    Patient understands monitoring blood glucose, interpreting and using results Needs Review    Patient understands prevention, detection, and treatment of acute complications. Comprehends key points    Patient understands prevention, detection, and treatment of chronic complications. Comprehends key points    Patient understands how to develop strategies to address psychosocial issues. Comprehends key points    Patient understands how to develop strategies to promote health/change behavior. Comprehends key points      Outcomes   Expected Outcomes Demonstrated interest in learning. Expect positive outcomes    Future DMSE 2 months    Program Status Not Completed             Individualized Plan for Diabetes Self-Management Training:   Learning Objective:  Patient will have a greater understanding of  diabetes self-management. Patient education plan is to attend individual and/or group sessions per assessed needs and concerns.   Plan:   Patient Instructions  1- Continue Physical activity-Great job!  2- Aim for balanced meals on a schedule  1/2 plate of non starchy vegetable, 1/4 protein, 1/4 starchy foods   Expected Outcomes:  Demonstrated interest in learning. Expect positive outcomes  Education material  provided: ADA - How to Thrive: A Guide for Your Journey with Diabetes, My Plate, and Snack sheet  If problems or questions, patient to contact team via:  Phone  Future DSME appointment: 2 months

## 2023-06-02 ENCOUNTER — Telehealth: Payer: Self-pay

## 2023-06-02 NOTE — Telephone Encounter (Signed)
Fax from [employer] for PA for Dexcom G7 sensors needed

## 2023-06-03 ENCOUNTER — Other Ambulatory Visit (HOSPITAL_COMMUNITY): Payer: Self-pay

## 2023-06-03 ENCOUNTER — Telehealth: Payer: Self-pay

## 2023-06-03 NOTE — Telephone Encounter (Signed)
Pharmacy Patient Advocate Encounter   Received notification from Pt Calls Messages that prior authorization for Dexcom G7 sensor is required/requested.   Insurance verification completed.   The patient is insured through [facility] .   Per test claim: PA required; PA submitted to above mentioned insurance via CoverMyMeds Key/confirmation #/EOC [id number] Status is pending

## 2023-06-08 ENCOUNTER — Other Ambulatory Visit: Payer: Self-pay

## 2023-06-09 NOTE — Telephone Encounter (Signed)
Pharmacy Patient Advocate Encounter  Received notification from [facility] that Prior Authorization for Dexcom G7 Sensor has been DENIED.  Full denial letter will be uploaded to the media tab. See denial reason below.  Beneficiary must meet criteria one through three (1 - 3) or one and four (1 and 4) or five (5): The beneficiary has a diagnosis of insulin-dependent diabetes; AND  The beneficiary or caregiver(s) is willing and able to use the therapeutic CGM system as prescribed; AND The beneficiary has had a face-to-face encounter with the treating practitioner to evaluate the beneficiary's glycemic control and determine that criteria one through three (1-3) above have been met, within six months of the initial authorization request; OR The beneficiary uses an external insulin pump OR The beneficiary has a diagnosis of gestational diabetes  PA #/Case ID/Reference #: [id number]

## 2023-07-22 ENCOUNTER — Other Ambulatory Visit: Payer: Self-pay

## 2023-07-22 DIAGNOSIS — E119 Type 2 diabetes mellitus without complications: Secondary | ICD-10-CM

## 2023-07-23 ENCOUNTER — Other Ambulatory Visit: Payer: Medicaid Other

## 2023-07-24 LAB — HEMOGLOBIN A1C
Hgb A1c MFr Bld: 5.9 %{Hb} — ABNORMAL HIGH (ref <5.7)
Mean Plasma Glucose: 123 mg/dL
eAG (mmol/L): 6.8 mmol/L

## 2023-07-29 ENCOUNTER — Encounter: Payer: Self-pay | Admitting: Endocrinology

## 2023-07-29 ENCOUNTER — Ambulatory Visit: Payer: Medicaid Other | Admitting: Endocrinology

## 2023-07-29 VITALS — BP 110/70 | HR 93 | Resp 16 | Ht 65.0 in | Wt 298.2 lb

## 2023-07-29 DIAGNOSIS — Z7984 Long term (current) use of oral hypoglycemic drugs: Secondary | ICD-10-CM

## 2023-07-29 DIAGNOSIS — E119 Type 2 diabetes mellitus without complications: Secondary | ICD-10-CM

## 2023-07-29 DIAGNOSIS — Z7985 Long-term (current) use of injectable non-insulin antidiabetic drugs: Secondary | ICD-10-CM

## 2023-07-29 MED ORDER — SEMAGLUTIDE (2 MG/DOSE) 8 MG/3ML ~~LOC~~ SOPN
2.0000 mg | PEN_INJECTOR | SUBCUTANEOUS | 4 refills | Status: DC
Start: 2023-07-29 — End: 2024-02-22

## 2023-07-29 NOTE — Progress Notes (Signed)
 REASON OF VISIT: Follow-up of type 2 diabetes mellitus  REFERRING PROVIDER: Verta Blossom, CNM  PCP: Verta Blossom, CNM  HISTORY OF PRESENT ILLNESS:   Shannon Lopez is a 33 y.o. old female with past medical history listed below, is here for follow-up of type 2 diabetes mellitus.   Pertinent Diabetes History: Patient states she had prediabetes seen 2021/2022 with hemoglobin A1c in the range of 6%.  When she was pregnant in 2023, she was diagnosed with gestational diabetes mellitus at 34 weeks of gestation, has significantly high blood sugar, and was treated with metformin . She continued metformin  after delivery and presented today for further evaluation and management of type 2 diabetes mellitus.  Initial consult was in October 2024.  Hemoglobin A1c was 6.5% in January 07, 2023 in the outside facility, records brought by the patient and reviewed in her phone, consistent with type 2 diabetes mellitus.  No personal history of pancreatitis.  No family history of medullary thyroid  carcinoma and MEM 2 syndrome.  Previous diabetes education: No  Chronic Diabetes Complications : Retinopathy: no. Last ophthalmology exam was done, not done.  Nephropathy: no Peripheral neuropathy: no, Coronary artery disease: no Stroke: no  Relevant comorbidities and cardiovascular risk factors: Obesity: yes Body mass index is 49.62 kg/m.  Hypertension: no Hyperlipidemia. no Tobacco smoking:  Social History   Tobacco Use  Smoking Status Former   Current packs/day: 0.00   Types: Cigarettes   Quit date: 2015   Years since quitting: 10.0  Smokeless Tobacco Never   Hypothyroidism: no  Current Diabetic regimen includes: Metformin  1000 mg 2 times a day. Ozempic  1 mg weekly.  Prior medications: Mounjaro  was not covered by medical insurance.  Factors modifying glucose control: 1.  Diabetic diet assessment: She has been watching and following low carbohydrate meals, she has also been practicing fasting  several hours in a day.  2.  Staying active or exercising:   3.  Medication compliance: compliant all of the time.  Glycemic data: No glucometer data to review.  Has not been checking blood sugar lately.  Interval history Patient has been taking Ozempic  and metformin  as noted above.  Recent hemoglobin A1c of 5.9%.  She has noticed some weight loss after being on Mounjaro  1 mg weekly.  Denies any GI issues, nausea, vomiting.  She has not been breast-feeding the baby.  No plan for pregnancy.  Patient reports weight today with holding the baby.  REVIEW OF SYSTEMS As per history of present illness.   PAST MEDICAL HISTORY: Past Medical History:  Diagnosis Date   Allergy    Asthma    Depression 06/18/2015   Morbid obesity with BMI of 40.0-44.9, adult [facility])     PAST SURGICAL HISTORY: Past Surgical History:  Procedure Laterality Date   CESAREAN SECTION N/A 12/03/2022   Procedure: CESAREAN SECTION;  Surgeon: Henry Slough, MD;  Location: [facility] LD ORS;  Service: Obstetrics;  Laterality: N/A;    ALLERGIES: Allergies  Allergen Reactions   Latex Hives    FAMILY HISTORY:  Family History  Problem Relation Age of Onset   Hyperlipidemia Father    Heart disease Father    Diabetes Father    Irritable bowel syndrome Father    Diabetes Maternal Grandmother        both sides of the family   Irritable bowel syndrome Sister     SOCIAL HISTORY: Social History   Socioeconomic History   Marital status: Married    Spouse name: Not on file   Number  of children: 0   Years of education: Not on file   Highest education level: Not on file  Occupational History   Occupation: student  Tobacco Use   Smoking status: Former    Current packs/day: 0.00    Types: Cigarettes    Quit date: 2015    Years since quitting: 10.0   Smokeless tobacco: Never  Vaping Use   Vaping status: Never Used  Substance and Sexual Activity   Alcohol use: No    Alcohol/week: 0.0 standard drinks of alcohol    Drug use: No   Sexual activity: Not Currently    Comment: last IC withinin past week  Other Topics Concern   Not on file  Social History Narrative   Not on file   Social Drivers of Health   Financial Resource Strain: Not on file  Food Insecurity: No Food Insecurity (05/28/2023)   Hunger Vital Sign    Worried About Running Out of Food in the Last Year: Never true    Ran Out of Food in the Last Year: Never true  Transportation Needs: No Transportation Needs (12/03/2022)   PRAPARE - Administrator, Civil Service (Medical): No    Lack of Transportation (Non-Medical): No  Physical Activity: Not on file  Stress: Not on file  Social Connections: Not on file    MEDICATIONS:  Current Outpatient Medications  Medication Sig Dispense Refill   acetaminophen  (TYLENOL ) 500 MG tablet Take 2 tablets (1,000 mg total) by mouth every 6 (six) hours. 30 tablet 0   albuterol  (PROVENTIL ) (5 MG/ML) 0.5% nebulizer solution Take 0.5 mLs (2.5 mg total) by nebulization every 6 (six) hours as needed for wheezing or shortness of breath. 20 mL 1   aspirin  EC 81 MG tablet Take 81 mg by mouth daily. Swallow whole.     Beclomethasone Dipropionate (QVAR IN) Inhale 2 puffs into the lungs at bedtime.     cetirizine  (ZYRTEC ) 10 MG tablet Take 10 mg by mouth at bedtime.     Cholecalciferol (VITAMIN D3 ULTRA STRENGTH) 125 MCG (5000 UT) capsule Take 5,000 Units by mouth daily.     cyclobenzaprine  (FLEXERIL ) 5 MG tablet Take 5 mg by mouth 2 (two) times daily as needed for muscle spasms.     ibuprofen  (ADVIL ) 600 MG tablet Take 1 tablet (600 mg total) by mouth every 6 (six) hours. 30 tablet 0   metFORMIN  (GLUCOPHAGE ) 500 MG tablet Take 1 tablet (500 mg total) by mouth 2 (two) times daily. 180 tablet 3   NIFEdipine  (ADALAT  CC) 60 MG 24 hr tablet Take 1 tablet (60 mg total) by mouth daily. 30 tablet 1   oxyCODONE  (OXY IR/ROXICODONE ) 5 MG immediate release tablet Take 1-2 tablets (5-10 mg total) by mouth every 4  (four) hours as needed for severe pain or breakthrough pain. 30 tablet 0   pantoprazole  (PROTONIX ) 20 MG tablet Take 1 tablet (20 mg total) by mouth daily. 30 tablet 0   Prenatal Vit-Fe Fumarate-FA (PRENATAL VITAMINS PO) Take 2 tablets by mouth daily.     Semaglutide , 2 MG/DOSE, 8 MG/3ML SOPN Inject 2 mg as directed once a week. 9 mL 4   furosemide  (LASIX ) 20 MG tablet Take 1 tablet (20 mg total) by mouth daily for 5 days. 5 tablet 0   No current facility-administered medications for this visit.    PHYSICAL EXAM: Vitals:   07/29/23 1053  BP: 110/70  Pulse: 93  Resp: 16  SpO2: 97%  Weight: 298 lb 3.2  oz (135.3 kg)  Height: 5' 5 (1.651 m)    Body mass index is 49.62 kg/m.    Patient reports weight today is with holding the baby.  General: Well developed, well nourished female in no apparent distress.  HEENT: AT/[place], no external lesions. Hearing intact to the spoken word Eyes: EOMI. Conjunctiva clear and no icterus. Neck: Trachea midline, neck supple  Abdomen: Soft Neurologic: Alert, oriented, normal speech Extremities: No pedal pitting edema Skin: Warm. + acanthosis nigricans Psychiatric: Does not appear depressed or anxiou  Diabetic Foot Exam - Simple   Simple Foot Form Diabetic Foot exam was performed with the following findings: Yes 07/29/2023 11:05 AM  Visual Inspection No deformities, no ulcerations, no other skin breakdown bilaterally: Yes Sensation Testing Intact to touch and monofilament testing bilaterally: Yes Pulse Check Posterior Tibialis and Dorsalis pulse intact bilaterally: Yes Comments    LABS Reviewed Lab Results  Component Value Date   HGBA1C 5.9 (H) 07/23/2023   No components found for: MICROALDIP, CREATURINE, MICCRERAT   ASSESSMENT / PLAN  1. Type 2 diabetes mellitus without complication, without long-term current use of insulin (HCC)     Diabetes Mellitus type 2, controlled.  With no known complications.  -Recent hemoglobin A1c 5.9%  has controlled type 2 diabetes mellitus.  Will increase the dose of Ozempic  to have increased weight loss benefit.  Discussed that Ozempic  is contraindicated during breast-feeding an pregnancy.  Patient is currently not breast-feeding.  She has no plan for pregnancy.  Strongly advised for birth control method.  In case if she got pregnant advised to stop Ozempic  immediately.  - Medications:  I) continue metformin  1000 mg 2 times a day. II) increase Ozempic  from 1 mg to 2 mg weekly.  She had seen diabetes educator /dietitian in the past.  - Home glucose testing: Recommended at least few times a week at different times of the day. - Discussed/ Gave Hypoglycemia treatment plan - Annual urine for microalbuminuria/ creatinine ratio. - Foot check nightly . - Annual dilated diabetic eye exams, patient reports has appointment next week. - Diet: Make healthy diabetic food choices, discussed in detail. - Life style / activity / exercise: Discussed.  2. Blood pressure  -  BP Readings from Last 1 Encounters:  07/29/23 110/70    - Control is in target.  - No change in current plans.  3. Lipid status / Hyperlipidemia -No indication of statin at this time.  Orders placed   No orders of the defined types were placed in this encounter.    DISPOSITION Follow up in clinic in 4  months suggested.  Labs on same day of the visit.   All questions answered and patient verbalized understanding of the plan.  Kyrus Hyde, MD [facility] Endocrinology [facility] [address] [place], [address] [place], [place] [address] Phone # [phone]  At least part of this note was generated using voice recognition software. Inadvertent word errors may have occurred, which were not recognized during the proofreading process.

## 2023-09-03 ENCOUNTER — Other Ambulatory Visit: Payer: Self-pay

## 2023-09-03 ENCOUNTER — Emergency Department (HOSPITAL_BASED_OUTPATIENT_CLINIC_OR_DEPARTMENT_OTHER): Payer: Medicaid Other | Admitting: Radiology

## 2023-09-03 ENCOUNTER — Emergency Department (HOSPITAL_BASED_OUTPATIENT_CLINIC_OR_DEPARTMENT_OTHER): Payer: Medicaid Other

## 2023-09-03 ENCOUNTER — Emergency Department (HOSPITAL_BASED_OUTPATIENT_CLINIC_OR_DEPARTMENT_OTHER)
Admission: EM | Admit: 2023-09-03 | Discharge: 2023-09-03 | Disposition: A | Discharge status: Home / Self Care | Payer: Medicaid Other | Attending: Emergency Medicine | Admitting: Emergency Medicine

## 2023-09-03 DIAGNOSIS — Z7982 Long term (current) use of aspirin: Secondary | ICD-10-CM | POA: Diagnosis not present

## 2023-09-03 DIAGNOSIS — R0602 Shortness of breath: Secondary | ICD-10-CM | POA: Insufficient documentation

## 2023-09-03 DIAGNOSIS — R791 Abnormal coagulation profile: Secondary | ICD-10-CM | POA: Insufficient documentation

## 2023-09-03 DIAGNOSIS — Z91040 Latex allergy status: Secondary | ICD-10-CM | POA: Insufficient documentation

## 2023-09-03 DIAGNOSIS — J45909 Unspecified asthma, uncomplicated: Secondary | ICD-10-CM | POA: Diagnosis not present

## 2023-09-03 DIAGNOSIS — Z7984 Long term (current) use of oral hypoglycemic drugs: Secondary | ICD-10-CM | POA: Diagnosis not present

## 2023-09-03 DIAGNOSIS — Z7951 Long term (current) use of inhaled steroids: Secondary | ICD-10-CM | POA: Diagnosis not present

## 2023-09-03 DIAGNOSIS — E119 Type 2 diabetes mellitus without complications: Secondary | ICD-10-CM | POA: Insufficient documentation

## 2023-09-03 DIAGNOSIS — R0789 Other chest pain: Secondary | ICD-10-CM | POA: Insufficient documentation

## 2023-09-03 DIAGNOSIS — Z794 Long term (current) use of insulin: Secondary | ICD-10-CM | POA: Diagnosis not present

## 2023-09-03 LAB — CBC WITH DIFFERENTIAL/PLATELET
Abs Immature Granulocytes: 0.01 10*3/uL (ref 0.00–0.07)
Basophils Absolute: 0.1 10*3/uL (ref 0.0–0.1)
Basophils Relative: 1 %
Eosinophils Absolute: 0.6 10*3/uL — ABNORMAL HIGH (ref 0.0–0.5)
Eosinophils Relative: 9 %
HCT: 45 % (ref 36.0–46.0)
Hemoglobin: 14.8 g/dL (ref 12.0–15.0)
Immature Granulocytes: 0 %
Lymphocytes Relative: 42 %
Lymphs Abs: 2.8 10*3/uL (ref 0.7–4.0)
MCH: 27.8 pg (ref 26.0–34.0)
MCHC: 32.9 g/dL (ref 30.0–36.0)
MCV: 84.4 fL (ref 80.0–100.0)
Monocytes Absolute: 0.6 10*3/uL (ref 0.1–1.0)
Monocytes Relative: 9 %
Neutro Abs: 2.6 10*3/uL (ref 1.7–7.7)
Neutrophils Relative %: 39 %
Platelets: 282 10*3/uL (ref 150–400)
RBC: 5.33 MIL/uL — ABNORMAL HIGH (ref 3.87–5.11)
RDW: 13.9 % (ref 11.5–15.5)
WBC: 6.7 10*3/uL (ref 4.0–10.5)
nRBC: 0 % (ref 0.0–0.2)

## 2023-09-03 LAB — COMPREHENSIVE METABOLIC PANEL
ALT: 18 U/L (ref 0–44)
AST: 12 U/L — ABNORMAL LOW (ref 15–41)
Albumin: 4.3 g/dL (ref 3.5–5.0)
Alkaline Phosphatase: 75 U/L (ref 38–126)
Anion gap: 10 (ref 5–15)
BUN: 20 mg/dL (ref 6–20)
CO2: 24 mmol/L (ref 22–32)
Calcium: 9.2 mg/dL (ref 8.9–10.3)
Chloride: 103 mmol/L (ref 98–111)
Creatinine, Ser: 0.63 mg/dL (ref 0.44–1.00)
GFR, Estimated: >60 mL/min (ref >60)
Glucose, Bld: 101 mg/dL — ABNORMAL HIGH (ref 70–99)
Potassium: 4 mmol/L (ref 3.5–5.1)
Sodium: 137 mmol/L (ref 135–145)
Total Bilirubin: 0.4 mg/dL (ref 0.0–1.2)
Total Protein: 8 g/dL (ref 6.5–8.1)

## 2023-09-03 LAB — LIPASE, BLOOD: Lipase: 19 U/L (ref 11–51)

## 2023-09-03 LAB — TROPONIN I (HIGH SENSITIVITY)
Troponin I (High Sensitivity): 3 ng/L (ref <18)
Troponin I (High Sensitivity): 3 ng/L (ref <18)

## 2023-09-03 LAB — D-DIMER, QUANTITATIVE: D-Dimer, Quant: 0.73 ug{FEU}/mL — ABNORMAL HIGH (ref 0.00–0.50)

## 2023-09-03 LAB — HCG, SERUM, QUALITATIVE: Preg, Serum: NEGATIVE

## 2023-09-03 MED ORDER — KETOROLAC TROMETHAMINE 30 MG/ML IJ SOLN
15.0000 mg | Freq: Once | INTRAMUSCULAR | Status: AC
  Administered 2023-09-03: 15 mg via INTRAVENOUS
  Filled 2023-09-03: qty 1

## 2023-09-03 MED ORDER — ALUM & MAG HYDROXIDE-SIMETH 200-200-20 MG/5ML PO SUSP
30.0000 mL | Freq: Once | ORAL | Status: AC
  Administered 2023-09-03: 30 mL via ORAL
  Filled 2023-09-03: qty 30

## 2023-09-03 MED ORDER — HYDROXYZINE HCL 25 MG PO TABS
25.0000 mg | ORAL_TABLET | Freq: Once | ORAL | Status: AC
  Administered 2023-09-03: 25 mg via ORAL
  Filled 2023-09-03: qty 1

## 2023-09-03 MED ORDER — LIDOCAINE VISCOUS HCL 2 % MT SOLN
15.0000 mL | Freq: Once | OROMUCOSAL | Status: AC
  Administered 2023-09-03: 15 mL via OROMUCOSAL
  Filled 2023-09-03: qty 15

## 2023-09-03 MED ORDER — IOHEXOL 350 MG/ML SOLN
100.0000 mL | Freq: Once | INTRAVENOUS | Status: AC | PRN
  Administered 2023-09-03: 100 mL via INTRAVENOUS

## 2023-09-03 NOTE — ED Triage Notes (Signed)
Pt reports waking up from sudden centralized chest pain around 3 am. CP described as aching, radiates into the upper back and left jaw, associated SOB and lightheaded. Former smoker.

## 2023-09-03 NOTE — Discharge Instructions (Signed)
No evidence of heart attack or blood clot in the lung.  Follow-up with your primary doctor as well as a cardiologist for consideration of stress test.  Return to the ED with exertional chest pain, pain associate with shortness of breath, nausea, vomiting, sweating or other concerns.

## 2023-09-03 NOTE — ED Notes (Signed)
Pt discharged home and given discharge paperwork. Opportunities given for questions. Pt verbalizes understanding. PIV removed x1. Jillyn Hidden , RN

## 2023-09-03 NOTE — ED Provider Notes (Signed)
[facility] EMERGENCY DEPARTMENT AT [facility] Provider Note   CSN: [id number] Arrival date & time: 09/03/23  0423     History  Chief Complaint  Patient presents with   Chest Pain    Meilah Delrosario is a 33 y.o. female.  Patient with a history of asthma, diabetes on metformin presents with chest pain that woke her from sleep about 3 AM.  Pain is in the center of her chest feels like a pressure that radiates to her bilateral shoulders and upper back.  She has had this pain in the past with "panic attacks" but does not have any inciting etiology today.  Pain is constant.  Nothing makes it better or worse.  Associate with shortness of breath.  No nausea or vomiting.  No cough or fever.  No diaphoresis.  No CAD history.  She denies any history of echocardiogram or stress test.  Feels lightheaded and shortness of breath with chest pressure going straight through to her back.  Feels similar to previous panic attacks.  Denies any family history of early MI or sudden cardiac death before age 46. States she gave birth last May and developed gestational diabetes and is now diabetic.  The history is provided by the patient.  Chest Pain Associated symptoms: shortness of breath   Associated symptoms: no abdominal pain, no dizziness, no fever, no headache, no nausea, no vomiting and no weakness        Home Medications Prior to Admission medications   Medication Sig Start Date End Date Taking? Authorizing Provider  acetaminophen (TYLENOL) 500 MG tablet Take 2 tablets (1,000 mg total) by mouth every 6 (six) hours. 12/06/22   Jaymes Graff, MD  albuterol (PROVENTIL) (5 MG/ML) 0.5% nebulizer solution Take 0.5 mLs (2.5 mg total) by nebulization every 6 (six) hours as needed for wheezing or shortness of breath. 03/04/22   Derwood Kaplan, MD  aspirin EC 81 MG tablet Take 81 mg by mouth daily. Swallow whole.    [provider]  Beclomethasone Dipropionate (QVAR IN) Inhale 2 puffs into the  lungs at bedtime.    [provider]  cetirizine (ZYRTEC) 10 MG tablet Take 10 mg by mouth at bedtime.    [provider]  Cholecalciferol (VITAMIN D3 ULTRA STRENGTH) 125 MCG (5000 UT) capsule Take 5,000 Units by mouth daily.    [provider]  cyclobenzaprine (FLEXERIL) 5 MG tablet Take 5 mg by mouth 2 (two) times daily as needed for muscle spasms. 11/12/22   [provider]  furosemide (LASIX) 20 MG tablet Take 1 tablet (20 mg total) by mouth daily for 5 days. 12/11/22 12/16/22  Rasch, Victorino Dike I, NP  ibuprofen (ADVIL) 600 MG tablet Take 1 tablet (600 mg total) by mouth every 6 (six) hours. 12/06/22   Jaymes Graff, MD  metFORMIN (GLUCOPHAGE) 500 MG tablet Take 1 tablet (500 mg total) by mouth 2 (two) times daily. 02/22/23   Thapa, [place], MD  NIFEdipine (ADALAT CC) 60 MG 24 hr tablet Take 1 tablet (60 mg total) by mouth daily. 12/07/22   Jaymes Graff, MD  oxyCODONE (OXY IR/ROXICODONE) 5 MG immediate release tablet Take 1-2 tablets (5-10 mg total) by mouth every 4 (four) hours as needed for severe pain or breakthrough pain. 12/06/22   Jaymes Graff, MD  pantoprazole (PROTONIX) 20 MG tablet Take 1 tablet (20 mg total) by mouth daily. 10/07/22   Aviva Signs, CNM  Prenatal Vit-Fe Fumarate-FA (PRENATAL VITAMINS PO) Take 2 tablets by mouth daily.  [provider]  Semaglutide, 2 MG/DOSE, 8 MG/3ML SOPN Inject 2 mg as directed once a week. 07/29/23   Thapa, [place], MD      Allergies    Latex    Review of Systems   Review of Systems  Constitutional:  Negative for activity change, appetite change and fever.  HENT:  Negative for congestion and rhinorrhea.   Respiratory:  Positive for chest tightness and shortness of breath.   Cardiovascular:  Positive for chest pain.  Gastrointestinal:  Negative for abdominal pain, nausea and vomiting.  Genitourinary:  Negative for dysuria and hematuria.  Musculoskeletal:  Negative for arthralgias and myalgias.  Skin:   Negative for rash.  Neurological:  Negative for dizziness, weakness and headaches.   all other systems are negative except as noted in the HPI and PMH.    Physical Exam Updated Vital Signs BP 135/87   Pulse (!) 57   Temp (!) 97.5 F (36.4 C)   Resp 10   Ht 5\' 5"  (1.651 m)   Wt 135.2 kg   LMP 09/03/2023   SpO2 100%   BMI 49.59 kg/m  Physical Exam Vitals and nursing note reviewed.  Constitutional:      General: She is not in acute distress.    Appearance: She is well-developed.  HENT:     Head: Normocephalic and atraumatic.     Mouth/Throat:     Pharynx: No oropharyngeal exudate.  Eyes:     Conjunctiva/sclera: Conjunctivae normal.     Pupils: Pupils are equal, round, and reactive to light.  Neck:     Comments: No meningismus. Cardiovascular:     Rate and Rhythm: Normal rate and regular rhythm.     Heart sounds: Normal heart sounds. No murmur heard. Pulmonary:     Effort: Pulmonary effort is normal. No respiratory distress.     Breath sounds: Normal breath sounds.     Comments: Not reproduced Chest:     Chest wall: No tenderness.  Abdominal:     Palpations: Abdomen is soft.     Tenderness: There is no abdominal tenderness. There is no guarding or rebound.  Musculoskeletal:        General: No tenderness. Normal range of motion.     Cervical back: Normal range of motion and neck supple.  Skin:    General: Skin is warm.  Neurological:     Mental Status: She is alert and oriented to person, place, and time.     Cranial Nerves: No cranial nerve deficit.     Motor: No abnormal muscle tone.     Coordination: Coordination normal.     Comments:  5/5 strength throughout. CN 2-12 intact.Equal grip strength.   Psychiatric:        Behavior: Behavior normal.     ED Results / Procedures / Treatments   Labs (all labs ordered are listed, but only abnormal results are displayed) Labs Reviewed  COMPREHENSIVE METABOLIC PANEL - Abnormal; Notable for the following components:       Result Value   Glucose, Bld 101 (*)    AST 12 (*)    All other components within normal limits  D-DIMER, QUANTITATIVE - Abnormal; Notable for the following components:   D-Dimer, Quant 0.73 (*)    All other components within normal limits  CBC WITH DIFFERENTIAL/PLATELET - Abnormal; Notable for the following components:   RBC 5.33 (*)    Eosinophils Absolute 0.6 (*)    All other components within normal limits  LIPASE, BLOOD  HCG, SERUM, QUALITATIVE  CBC WITH DIFFERENTIAL/PLATELET  TROPONIN I (HIGH SENSITIVITY)  TROPONIN I (HIGH SENSITIVITY)    EKG EKG Interpretation Date/Time:  Friday September 03 2023 04:34:24 EST Ventricular Rate:  71 PR Interval:  152 QRS Duration:  96 QT Interval:  396 QTC Calculation: 430 R Axis:   42  Text Interpretation: Normal sinus rhythm with sinus arrhythmia ST elevation, consider early repolarization, pericarditis, or injury Abnormal ECG When compared with ECG of 10-Dec-2022 21:00, No significant change was found No significant change was found Confirmed by Glynn Octave [phone]) on 09/03/2023 4:49:28 AM  Radiology CT Angio Chest PE W and/or Wo Contrast Result Date: 09/03/2023 CLINICAL DATA:  33 year old female with history of chest pain radiating into the upper back and left side of the jaw with shortness of breath and lightheadedness. EXAM: CT ANGIOGRAPHY CHEST WITH CONTRAST TECHNIQUE: Multidetector CT imaging of the chest was performed using the standard protocol during bolus administration of intravenous contrast. Multiplanar CT image reconstructions and MIPs were obtained to evaluate the vascular anatomy. RADIATION DOSE REDUCTION: This exam was performed according to the departmental dose-optimization program which includes automated exposure control, adjustment of the mA and/or kV according to patient size and/or use of iterative reconstruction technique. CONTRAST:  OMNIPAQUE IOHEXOL 350 MG/ML SOLN COMPARISON:  Chest CTA 12/11/2022.  FINDINGS: Cardiovascular: No filling defects are noted within the pulmonary arterial tree to suggest pulmonary embolism. Heart size is borderline enlarged. There is no significant pericardial fluid, thickening or pericardial calcification. No atherosclerotic calcifications are noted in the thoracic aorta or the coronary arteries. Mediastinum/Nodes: No pathologically enlarged mediastinal or hilar lymph nodes. Esophagus is unremarkable in appearance. No axillary lymphadenopathy. Lungs/Pleura: No acute consolidative airspace disease. No pleural effusions. No suspicious appearing pulmonary nodules or masses are noted. Upper Abdomen: Unremarkable. Musculoskeletal: There are no aggressive appearing lytic or blastic lesions noted in the visualized portions of the skeleton. Review of the MIP images confirms the above findings. IMPRESSION: 1. No acute findings in the thorax to account for the patient's symptoms. Specifically, no evidence of pulmonary embolism. Electronically Signed   By: Trudie Reed M.D.   On: 09/03/2023 06:38   DG Chest 2 View Result Date: 09/03/2023 CLINICAL DATA:  33 year old female with history of chest pain. EXAM: CHEST - 2 VIEW COMPARISON:  Chest x-ray 12/11/2022. FINDINGS: Lung volumes are normal. No consolidative airspace disease. No pleural effusions. No pneumothorax. No pulmonary nodule or mass noted. Pulmonary vasculature and the cardiomediastinal silhouette are within normal limits. IMPRESSION: No radiographic evidence of acute cardiopulmonary disease. Electronically Signed   By: Trudie Reed M.D.   On: 09/03/2023 05:35    Procedures Procedures    Medications Ordered in ED Medications  hydrOXYzine (ATARAX) tablet 25 mg (has no administration in time range)    ED Course/ Medical Decision Making/ A&P                                 Medical Decision Making Amount and/or Complexity of Data Reviewed Labs: ordered. Decision-making details documented in ED  Course. Radiology: ordered and independent interpretation performed. Decision-making details documented in ED Course. ECG/medicine tests: ordered and independent interpretation performed. Decision-making details documented in ED Course.  Risk OTC drugs. Prescription drug management.   Central chest pain and pressure that woke her from sleep.  EKG is sinus rhythm.  No acute ST changes.  Pain not reproducible.  Labs reassuring.  Troponin negative.  LFTs  and lipase are normal.  D-dimer slightly elevated.  Chest x-ray negative for infiltrate or pneumonia or pneumothorax.  Results reviewed interpreted by me.  Given Toradol, GI cocktail.  Also given Atarax as she says she has had this pain before with anxiety.  Denies any cardiac history.  CT scan is negative for pulmonary embolism or other acute pathology. D/w Dr. Llana Aliment of radiology.  Contrast poorly time to assess for aorta but no obvious evidence of aneurysm or dissection.  Repeat troponin and EKG pending at shift change.  Will refer to cardiology for consideration of outpatient stress test.  Care transferred at shift change pending repeat troponin and EKG.  Will refer to cardiology for consideration of outpatient stress test.        Final Clinical Impression(s) / ED Diagnoses Final diagnoses:  Atypical chest pain    Rx / DC Orders ED Discharge Orders     None         Conda Wannamaker, Jeannett Senior, MD 09/03/23 0725

## 2023-09-03 NOTE — ED Provider Notes (Signed)
Care transferred to me. Patient is feeling better. CTA and troponins are unremarkable. Unclear cause of her symptoms, but low suspicion for serious pathology. Will d/c home with return precautions.  Of note, she is bradycardic but asymptomatic from this.  I suspect this is because she is at rest.  ECG shows a sinus bradycardia without significant pathology.   EKG Interpretation Date/Time:  Friday September 03 2023 04:34:24 EST Ventricular Rate:  71 PR Interval:  152 QRS Duration:  96 QT Interval:  396 QTC Calculation: 430 R Axis:   42  Text Interpretation: Normal sinus rhythm with sinus arrhythmia ST elevation, consider early repolarization, pericarditis, or injury Abnormal ECG When compared with ECG of 10-Dec-2022 21:00, No significant change was found No significant change was found Confirmed by Glynn Octave [phone]) on 09/03/2023 4:49:28 AM          Pricilla Loveless, MD 09/03/23 [phone]

## 2023-09-05 ENCOUNTER — Emergency Department (HOSPITAL_BASED_OUTPATIENT_CLINIC_OR_DEPARTMENT_OTHER)
Admission: EM | Admit: 2023-09-05 | Discharge: 2023-09-05 | Disposition: A | Discharge status: Home / Self Care | Payer: Medicaid Other | Attending: Emergency Medicine | Admitting: Emergency Medicine

## 2023-09-05 ENCOUNTER — Encounter (HOSPITAL_BASED_OUTPATIENT_CLINIC_OR_DEPARTMENT_OTHER): Payer: Self-pay

## 2023-09-05 ENCOUNTER — Other Ambulatory Visit: Payer: Self-pay

## 2023-09-05 DIAGNOSIS — Z87891 Personal history of nicotine dependence: Secondary | ICD-10-CM | POA: Insufficient documentation

## 2023-09-05 DIAGNOSIS — R0602 Shortness of breath: Secondary | ICD-10-CM | POA: Diagnosis present

## 2023-09-05 DIAGNOSIS — R5383 Other fatigue: Secondary | ICD-10-CM | POA: Diagnosis not present

## 2023-09-05 DIAGNOSIS — J45909 Unspecified asthma, uncomplicated: Secondary | ICD-10-CM | POA: Insufficient documentation

## 2023-09-05 DIAGNOSIS — R42 Dizziness and giddiness: Secondary | ICD-10-CM | POA: Diagnosis not present

## 2023-09-05 DIAGNOSIS — R0789 Other chest pain: Secondary | ICD-10-CM | POA: Insufficient documentation

## 2023-09-05 LAB — CBC
HCT: 47.1 % — ABNORMAL HIGH (ref 36.0–46.0)
Hemoglobin: 16.1 g/dL — ABNORMAL HIGH (ref 12.0–15.0)
MCH: 28 pg (ref 26.0–34.0)
MCHC: 34.2 g/dL (ref 30.0–36.0)
MCV: 81.9 fL (ref 80.0–100.0)
Platelets: 315 10*3/uL (ref 150–400)
RBC: 5.75 MIL/uL — ABNORMAL HIGH (ref 3.87–5.11)
RDW: 13.7 % (ref 11.5–15.5)
WBC: 6.9 10*3/uL (ref 4.0–10.5)
nRBC: 0 % (ref 0.0–0.2)

## 2023-09-05 LAB — BASIC METABOLIC PANEL
Anion gap: 10 (ref 5–15)
BUN: 15 mg/dL (ref 6–20)
CO2: 23 mmol/L (ref 22–32)
Calcium: 10.3 mg/dL (ref 8.9–10.3)
Chloride: 104 mmol/L (ref 98–111)
Creatinine, Ser: 0.61 mg/dL (ref 0.44–1.00)
GFR, Estimated: >60 mL/min (ref >60)
Glucose, Bld: 95 mg/dL (ref 70–99)
Potassium: 4 mmol/L (ref 3.5–5.1)
Sodium: 137 mmol/L (ref 135–145)

## 2023-09-05 LAB — RESP PANEL BY RT-PCR (RSV, FLU A&B, COVID)  RVPGX2
Influenza A by PCR: NEGATIVE
Influenza B by PCR: NEGATIVE
Resp Syncytial Virus by PCR: NEGATIVE
SARS Coronavirus 2 by RT PCR: NEGATIVE

## 2023-09-05 LAB — TROPONIN I (HIGH SENSITIVITY): Troponin I (High Sensitivity): 3 ng/L (ref <18)

## 2023-09-05 NOTE — ED Provider Notes (Signed)
[facility] EMERGENCY [facility] Emergency Department Provider Note MRN:  [id number]  Arrival date & time: 09/05/23     Chief Complaint   Shortness of Breath   History of Present Illness   Shannon Lopez is a 33 y.o. year-old female with a history of asthma presenting to the ED with chief complaint of shortness of breath.  Patient was here in the emergency department 2 days ago with chest discomfort, had a reassuring evaluation and was discharged.  Today at about 6 PM experienced some shortness of breath and severe fatigue, some lightheadedness.  Checked her blood pressure 6 times and it was elevated each time, 150s systolic.  This concerned her and she is here for evaluation currently denies chest pain or shortness of breath, largely asymptomatic at this time.  Review of Systems  A thorough review of systems was obtained and all systems are negative except as noted in the HPI and PMH.   Patient's Health History    Past Medical History:  Diagnosis Date   Allergy    Asthma    Depression 06/18/2015   Morbid obesity with BMI of 40.0-44.9, adult [facility])     Past Surgical History:  Procedure Laterality Date   CESAREAN SECTION N/A 12/03/2022   Procedure: CESAREAN SECTION;  Surgeon: Osborn Coho, MD;  Location: [facility] LD ORS;  Service: Obstetrics;  Laterality: N/A;    Family History  Problem Relation Age of Onset   Hyperlipidemia Father    Heart disease Father    Diabetes Father    Irritable bowel syndrome Father    Diabetes Maternal Grandmother        both sides of the family   Irritable bowel syndrome Sister     Social History   Socioeconomic History   Marital status: Married    Spouse name: Not on file   Number of children: 0   Years of education: Not on file   Highest education level: Not on file  Occupational History   Occupation: student  Tobacco Use   Smoking status: Former    Current packs/day: 0.00    Types: Cigarettes    Quit date: 2015    Years since  quitting: 10.1   Smokeless tobacco: Never  Vaping Use   Vaping status: Never Used  Substance and Sexual Activity   Alcohol use: No    Alcohol/week: 0.0 standard drinks of alcohol   Drug use: No   Sexual activity: Not Currently    Comment: last IC withinin past week  Other Topics Concern   Not on file  Social History Narrative   Not on file   Social Drivers of Health   Financial Resource Strain: Not on file  Food Insecurity: No Food Insecurity (05/28/2023)   Hunger Vital Sign    Worried About Running Out of Food in the Last Year: Never true    Ran Out of Food in the Last Year: Never true  Transportation Needs: No Transportation Needs (12/03/2022)   PRAPARE - Administrator, Civil Service (Medical): No    Lack of Transportation (Non-Medical): No  Physical Activity: Not on file  Stress: Not on file  Social Connections: Not on file  Intimate Partner Violence: Not on file     Physical Exam   Vitals:   09/05/23 2104  BP: 122/77  Pulse: 75  Resp: (!) 22  Temp: (!) 97.4 F (36.3 C)  SpO2: 100%    CONSTITUTIONAL: Well-appearing, NAD NEURO/PSYCH:  Alert and oriented x 3, no  focal deficits EYES:  eyes equal and reactive ENT/NECK:  no LAD, no JVD CARDIO: Regular rate, well-perfused, normal S1 and S2 PULM:  CTAB no wheezing or rhonchi GI/GU:  non-distended, non-tender MSK/SPINE:  No gross deformities, no edema SKIN:  no rash, atraumatic   *Additional and/or pertinent findings included in MDM below  Diagnostic and Interventional Summary    EKG Interpretation Date/Time:  Sunday September 05 2023 21:08:21 EST Ventricular Rate:  77 PR Interval:  148 QRS Duration:  98 QT Interval:  382 QTC Calculation: 432 R Axis:   34  Text Interpretation: Normal sinus rhythm Cannot rule out Anterior infarct , age undetermined Abnormal ECG When compared with ECG of 03-Sep-2023 08:00, PREVIOUS ECG IS PRESENT Confirmed by Kennis Carina [id number]) on 09/05/2023 11:17:21 PM        Labs Reviewed  CBC - Abnormal; Notable for the following components:      Result Value   RBC 5.75 (*)    Hemoglobin 16.1 (*)    HCT 47.1 (*)    All other components within normal limits  RESP PANEL BY RT-PCR (RSV, FLU A&B, COVID)  [id number]  BASIC METABOLIC PANEL  PREGNANCY, URINE  TROPONIN I (HIGH SENSITIVITY)  TROPONIN I (HIGH SENSITIVITY)    No orders to display    Medications - No data to display   Procedures  /  Critical Care Procedures  ED Course and Medical Decision Making  Initial Impression and Ddx Patient is well-appearing in no acute distress with reassuring vital signs.  No increased work of breathing, lungs clear, no murmurs, no leg pain or swelling, abdomen soft and nontender.  Had a thorough evaluation with negative CT PE study 2 days ago.  Favoring a noncardiopulmonary cause of patient's symptoms, possibly GERD, exhaustion, stress, deconditioning, dehydration, anxiety.  Patient explains that she is in with a 22-month-old and has been stressed recently.  Highly doubt PE especially given the recent negative workup for PE 2 days ago.  Awaiting labs.  Past medical/surgical history that increases complexity of ED encounter: None  Interpretation of Diagnostics I personally reviewed the EKG and my interpretation is as follows: Sinus rhythm without concerning features, no significant changes  Labs reassuring with no significant blood count or electrolyte disturbance, troponin negative  Patient Reassessment and Ultimate Disposition/Management     We discussed pros and cons of repeat testing with regard to D-dimer, CT PE study, these tests were offered but with shared decision making we thought it best to avoid this given the very low concern for PE.  Overall her symptoms are thought to be due to a benign process of some kind, she has had some labile heart rates recently according to her Apple Watch and so will refer to cardiology for possible echocardiogram.  Appropriate for  discharge.  Patient management required discussion with the following services or consulting groups:  None  Complexity of Problems Addressed Acute illness or injury that poses threat of life of bodily function  Additional Data Reviewed and Analyzed Further history obtained from: Further history from spouse/family member  Additional Factors Impacting ED Encounter Risk Consideration of hospitalization  Elmer Sow. Pilar Plate, MD [facility] Emergency Medicine [facility] [email or web address]  Final Clinical Impressions(s) / ED Diagnoses     ICD-10-CM   1. SOB (shortness of breath)  R06.02 Ambulatory referral to Cardiology      ED Discharge Orders          Ordered    Ambulatory referral to Cardiology  09/05/23 2328             Discharge Instructions Discussed with and Provided to Patient:    Discharge Instructions      You were evaluated in the Emergency Department and after careful evaluation, we did not find any emergent condition requiring admission or further testing in the hospital.  Your exam/testing today was overall reassuring.  Recommend follow-up with cardiology to further discuss her symptoms.  Please return to the Emergency Department if you experience any worsening of your condition.  Thank you for allowing [place] to be a part of your care.       Sabas Sous, MD 09/05/23 [phone]

## 2023-09-05 NOTE — ED Triage Notes (Signed)
Pt POV with husband d/t increasing SOB.  Pt was seen here on Friday d/t CP and was told to return if gets dizzy or SOB.  Pt also took BP at home and noticed high and took Nifedipine that she had from last May for PreEclampsia.

## 2023-09-05 NOTE — Discharge Instructions (Addendum)
You were evaluated in the Emergency Department and after careful evaluation, we did not find any emergent condition requiring admission or further testing in the hospital.  Your exam/testing today was overall reassuring.  Recommend follow-up with cardiology to further discuss her symptoms.  Please return to the Emergency Department if you experience any worsening of your condition.  Thank you for allowing [place] to be a part of your care.

## 2023-09-05 NOTE — ED Notes (Addendum)
Patient with history of asthma. Upon questioning, patient does not feel like the shortness of breath she is experiencing is related to her asthma. Patient does not feel as though she is wheezing. BBS CTAB. SpO2 100% in triage.

## 2023-11-10 ENCOUNTER — Ambulatory Visit: Payer: Medicaid Other | Attending: Cardiology | Admitting: Cardiology

## 2023-11-10 ENCOUNTER — Encounter: Payer: Self-pay | Admitting: Cardiology

## 2023-11-10 VITALS — BP 120/88 | HR 67 | Resp 16 | Ht 65.0 in | Wt 278.2 lb

## 2023-11-10 DIAGNOSIS — R0602 Shortness of breath: Secondary | ICD-10-CM | POA: Diagnosis not present

## 2023-11-10 DIAGNOSIS — E66813 Obesity, class 3: Secondary | ICD-10-CM

## 2023-11-10 DIAGNOSIS — R072 Precordial pain: Secondary | ICD-10-CM | POA: Diagnosis not present

## 2023-11-10 DIAGNOSIS — R002 Palpitations: Secondary | ICD-10-CM

## 2023-11-10 DIAGNOSIS — Z6841 Body Mass Index (BMI) 40.0 and over, adult: Secondary | ICD-10-CM

## 2023-11-10 DIAGNOSIS — E119 Type 2 diabetes mellitus without complications: Secondary | ICD-10-CM | POA: Diagnosis not present

## 2023-11-10 NOTE — Progress Notes (Signed)
 Cardiology Office Note:    Date:  11/10/2023  NAME:  Shannon Lopez    MRN: [id number] DOB:  June 08, 1991   PCP:  Havery Lions, CNM  Former Cardiology Providers: None Primary Cardiologist:  None, FACC (established care 11/10/2023) Electrophysiologist:  None   Referring MD: Edson Graces, MD  Reason of Consult: Shortness of breath  Chief Complaint  Patient presents with   Shortness of Breath   New Patient (Initial Visit)    History of Present Illness:    Shannon Lopez is a 33 y.o. African-American female whose past medical history and cardiovascular risk factors includes: Preeclampsia, Asthma, NIDDM Type II, obesity, . She is being seen today for the evaluation of shortness of breath at the request of Edson Graces, MD.  Patient is referred to the practice at the request of emergency room department at [facility] for evaluation of shortness of breath.  Patient has had 2 ER visits in the recent past for shortness of breath.   Patient states when she went to the hospital first time she was having shortness of breath as well as chest pain.  Chest pain was noncardiac based on symptoms.  She underwent workup which noted high sensitive troponins being within normal limits, EKG was nonischemic.  CT PE protocol negative for pulmonary embolism and she was sent back home.  She presented to the ER a second time for shortness of breath at that time she underwent additional evaluation and based on ED provider notes it was felt that her symptoms were predominant noncardiac either secondary to possible GERD, exhaustion, stress, deconditioning, dehydration, anxiety.  Today patient states that she has not had any reoccurrence of precordial pain after the first ER visit.  Her shortness of breath also has resolved.  She does endorse palpitations which she refers to as extra beats or skipped beats.  Palpitations: Ongoing for the last couple months. Occurs once or twice a week. Duration couple  seconds or less. Feels like a skipped beat. No near-syncope or syncopal events.   No change in physical endurance  Factors to consider: History of thyroid  disease: None History of anemia: None Coffee consumption: double shot espresso americano  Caffeinated beverages/sodas: None Energy drinks: None Alcohol use: once every 2-3 months  Stimulants: none Illicit drugs: none Weight loss supplements: Semaglutide   New over-the-counter medications: None Herbal supplements: None Any prior workup: None  Patient works as a Production assistant, radio at a [employer] and gets at least 10,000 steps per shift.  Otherwise no structured exercise program or daily routine.  Current Medications: Current Meds  Medication Sig   acetaminophen  (TYLENOL ) 500 MG tablet Take 2 tablets (1,000 mg total) by mouth every 6 (six) hours.   albuterol  (PROVENTIL ) (5 MG/ML) 0.5% nebulizer solution Take 0.5 mLs (2.5 mg total) by nebulization every 6 (six) hours as needed for wheezing or shortness of breath.   Cholecalciferol (VITAMIN D3 ULTRA STRENGTH) 125 MCG (5000 UT) capsule Take 5,000 Units by mouth daily.   cyclobenzaprine  (FLEXERIL ) 5 MG tablet Take 5 mg by mouth 2 (two) times daily as needed for muscle spasms.   metFORMIN  (GLUCOPHAGE ) 500 MG tablet Take 1 tablet (500 mg total) by mouth 2 (two) times daily.   NIFEdipine  (ADALAT  CC) 60 MG 24 hr tablet Take 1 tablet (60 mg total) by mouth daily. (Patient taking differently: Take 60 mg by mouth as needed.)   pantoprazole  (PROTONIX ) 20 MG tablet Take 1 tablet (20 mg total) by mouth daily.   Prenatal  Vit-Fe Fumarate-FA (PRENATAL VITAMINS PO) Take 2 tablets by mouth daily.   Semaglutide , 2 MG/DOSE, 8 MG/3ML SOPN Inject 2 mg as directed once a week.     Allergies:    Latex   Past Medical History: Past Medical History:  Diagnosis Date   Allergy    Asthma    Depression 06/18/2015   Morbid obesity with BMI of 40.0-44.9, adult [facility])     Past Surgical History: Past  Surgical History:  Procedure Laterality Date   CESAREAN SECTION N/A 12/03/2022   Procedure: CESAREAN SECTION;  Surgeon: Renea Carrion, MD;  Location: [facility] LD ORS;  Service: Obstetrics;  Laterality: N/A;    Social History: Social History   Tobacco Use   Smoking status: Former    Current packs/day: 0.00    Types: Cigarettes    Quit date: 2015    Years since quitting: 10.3   Smokeless tobacco: Never  Vaping Use   Vaping status: Never Used  Substance Use Topics   Alcohol use: No    Alcohol/week: 0.0 standard drinks of alcohol   Drug use: No    Family History: Family History  Problem Relation Age of Onset   Hyperlipidemia Father    Heart disease Father    Diabetes Father    Irritable bowel syndrome Father    Diabetes Maternal Grandmother        both sides of the family   Irritable bowel syndrome Sister     ROS:   Review of Systems  Cardiovascular:  Positive for palpitations (see HPI). Negative for chest pain, claudication, irregular heartbeat, leg swelling, near-syncope, orthopnea, paroxysmal nocturnal dyspnea and syncope.  Respiratory:  Negative for shortness of breath.   Hematologic/Lymphatic: Negative for bleeding problem.    EKGs/Labs/Other Studies Reviewed:   EKG: EKG Interpretation Date/Time:  Wednesday November 10 2023 14:05:03 EDT Ventricular Rate:  69 PR Interval:  156 QRS Duration:  96 QT Interval:  378 QTC Calculation: 405 R Axis:   36  Text Interpretation: Normal sinus rhythm with sinus arrhythmia T wave abnormality, consider inferior ischemia When compared with ECG of 05-Sep-2023 21:08, No significant change was found Confirmed by Olinda Bertrand [phone]) on 11/10/2023 2:17:46 PM    RADIOLOGY: CT PE protocol 08/2023 1. No acute findings in the thorax to account for the patient's symptoms. Specifically, no evidence of pulmonary embolism.   Labs:    Latest Ref Rng & Units 09/05/2023    9:09 PM 09/03/2023    4:46 AM 12/10/2022    8:51 PM  CBC  WBC 4.0 -  10.5 K/uL 6.9  6.7  6.0   Hemoglobin 12.0 - 15.0 g/dL 60.4  54.0  98.1   Hematocrit 36.0 - 46.0 % 47.1  45.0  37.1   Platelets 150 - 400 K/uL 315  282  283        Latest Ref Rng & Units 09/05/2023    9:09 PM 09/03/2023    4:46 AM 12/10/2022    8:51 PM  BMP  Glucose 70 - 99 mg/dL 95  191  70   BUN 6 - 20 mg/dL 15  20  15    Creatinine 0.44 - 1.00 mg/dL 4.78  2.95  6.21   Sodium 135 - 145 mmol/L 137  137  137   Potassium 3.5 - 5.1 mmol/L 4.0  4.0  4.1   Chloride 98 - 111 mmol/L 104  103  105   CO2 22 - 32 mmol/L 23  24  22    Calcium 8.9 -  10.3 mg/dL 16.1  9.2  9.0       Latest Ref Rng & Units 09/05/2023    9:09 PM 09/03/2023    4:46 AM 12/10/2022    8:51 PM  CMP  Glucose 70 - 99 mg/dL 95  096  70   BUN 6 - 20 mg/dL 15  20  15    Creatinine 0.44 - 1.00 mg/dL 0.45  4.09  8.11   Sodium 135 - 145 mmol/L 137  137  137   Potassium 3.5 - 5.1 mmol/L 4.0  4.0  4.1   Chloride 98 - 111 mmol/L 104  103  105   CO2 22 - 32 mmol/L 23  24  22    Calcium 8.9 - 10.3 mg/dL 91.4  9.2  9.0   Total Protein 6.5 - 8.1 g/dL  8.0  6.3   Total Bilirubin 0.0 - 1.2 mg/dL  0.4  0.5   Alkaline Phos 38 - 126 U/L  75  92   AST 15 - 41 U/L  12  22   ALT 0 - 44 U/L  18  21     No results found for: "CHOL", "HDL", "LDLCALC", "LDLDIRECT", "TRIG", "CHOLHDL" No results for input(s): "LIPOA" in the last 8760 hours. No components found for: "NTPROBNP" No results for input(s): "PROBNP" in the last 8760 hours. No results for input(s): "TSH" in the last 8760 hours.  Physical Exam:    Today's Vitals   11/10/23 1400  BP: 120/88  Pulse: 67  Resp: 16  SpO2: 97%  Weight: 278 lb 3.2 oz (126.2 kg)  Height: 5\' 5"  (1.651 m)   Body mass index is 46.29 kg/m. Wt Readings from Last 3 Encounters:  11/10/23 278 lb 3.2 oz (126.2 kg)  09/05/23 298 lb (135.2 kg)  09/03/23 298 lb (135.2 kg)    Physical Exam  Constitutional: No distress.  hemodynamically stable  Neck: No JVD present.  Cardiovascular: Normal rate, regular  rhythm, S1 normal and S2 normal. Exam reveals no gallop, no S3 and no S4.  No murmur heard. Pulses:      Dorsalis pedis pulses are 2+ on the right side and 2+ on the left side.       Posterior tibial pulses are 2+ on the right side and 2+ on the left side.  Pulmonary/Chest: Effort normal and breath sounds normal. No stridor. She has no wheezes. She has no rales.  Abdominal: She exhibits no distension. There is no abdominal tenderness.  Abdominal obesity  Musculoskeletal:        General: No edema.     Cervical back: Neck supple.  Skin: Skin is warm.     Impression & Recommendation(s):  Impression:   ICD-10-CM   1. Shortness of breath  R06.02 EKG 12-Lead    2. Precordial pain  R07.2     3. Palpitations  R00.2     4. Non-insulin dependent type 2 diabetes mellitus (HCC)  E11.9     5. Class 3 severe obesity due to excess calories with serious comorbidity and body mass index (BMI) of 45.0 to 49.9 in adult [facility])  N82.956    Z68.42    E66.01        Recommendation(s):  Shortness of breath Has had 2 ER visits for evaluation of shortness of breath. CT PE study from February 2025 reviewed, per report no pulmonary embolism Clinically euvolemic and not in congestive heart failure EKG illustrates sinus rhythm no significant change from prior tracing Symptoms have now resolved.  Precordial pain Index event September 03, 2023 - leading up to ER visit  High sensitive troponins negative x 2. EKG illustrates sinus rhythm with T wave abnormality in the inferior leads suggestive of possible ischemia.  These are NOT new, findings are noted on prior tracings as well. Symptoms have resolved prior to going back to the ED on 09/05/2023 The patient endorses that in retrospect her precordial pain was likely musculoskeletal Given her symptoms of shortness of breath, cardiac pain, EKG offered her echocardiogram to evaluate for LVEF and structural heart disease.  However since her symptoms have resolved  she would like to hold off on additional testing at time time which is reasonable.   Palpitations Intermittent. Duration less than couple seconds occurs 1-2 times per week. Overall diagnostic yield for Zio patch is low at this time.  However would be reasonable if these symptoms occur frequently to proceed forward with a cardiac monitor.  Patient agreeable with the plan of care. Reassurance provided that likely PACs or PVCs that she is experiencing. Emphasized the importance of reducing caffeinated beverages especially during the days when she is having symptoms Monitor for now  Non-insulin dependent type 2 diabetes mellitus (HCC) Currently on metformin  and Ozempic . Most recent hemoglobin A1c 5.9 as of January 2025, [facility] database  Class 3 severe obesity due to excess calories with serious comorbidity and body mass index (BMI) of 45.0 to 49.9 in adult [facility]) Body mass index is 46.29 kg/m. I reviewed with her importance of diet, regular physical activity/exercise, weight loss.   Patient is educated on the importance of increasing physical activity gradually as tolerated with a goal of moderate intensity exercise for 30 minutes a day 5 days a week.  As part of today's consultation reviewed ER documentation from 09/03/2023 as well as 09/05/2023, labs from 09/05/2023 independently reviewed, CT scan 09/03/2023, EKG ordered and independently reviewed, discussed workup, reemphasize importance of improving her modifiable cardiovascular risk factors, coordination of care.  Orders Placed:  Orders Placed This Encounter  Procedures   EKG 12-Lead     Final Medication List:   No orders of the defined types were placed in this encounter.   Medications Discontinued During This Encounter  Medication Reason   aspirin  EC 81 MG tablet Patient Preference   cetirizine  (ZYRTEC ) 10 MG tablet Patient Preference   furosemide  (LASIX ) 20 MG tablet Patient Preference   ibuprofen  (ADVIL ) 600 MG tablet Patient  Preference   oxyCODONE  (OXY IR/ROXICODONE ) 5 MG immediate release tablet Patient Preference     Current Outpatient Medications:    acetaminophen  (TYLENOL ) 500 MG tablet, Take 2 tablets (1,000 mg total) by mouth every 6 (six) hours., Disp: 30 tablet, Rfl: 0   albuterol  (PROVENTIL ) (5 MG/ML) 0.5% nebulizer solution, Take 0.5 mLs (2.5 mg total) by nebulization every 6 (six) hours as needed for wheezing or shortness of breath., Disp: 20 mL, Rfl: 1   Cholecalciferol (VITAMIN D3 ULTRA STRENGTH) 125 MCG (5000 UT) capsule, Take 5,000 Units by mouth daily., Disp: , Rfl:    cyclobenzaprine  (FLEXERIL ) 5 MG tablet, Take 5 mg by mouth 2 (two) times daily as needed for muscle spasms., Disp: , Rfl:    metFORMIN  (GLUCOPHAGE ) 500 MG tablet, Take 1 tablet (500 mg total) by mouth 2 (two) times daily., Disp: 180 tablet, Rfl: 3   NIFEdipine  (ADALAT  CC) 60 MG 24 hr tablet, Take 1 tablet (60 mg total) by mouth daily. (Patient taking differently: Take 60 mg by mouth as needed.), Disp: 30 tablet, Rfl: 1  pantoprazole  (PROTONIX ) 20 MG tablet, Take 1 tablet (20 mg total) by mouth daily., Disp: 30 tablet, Rfl: 0   Prenatal Vit-Fe Fumarate-FA (PRENATAL VITAMINS PO), Take 2 tablets by mouth daily., Disp: , Rfl:    Semaglutide , 2 MG/DOSE, 8 MG/3ML SOPN, Inject 2 mg as directed once a week., Disp: 9 mL, Rfl: 4   Beclomethasone Dipropionate (QVAR IN), Inhale 2 puffs into the lungs at bedtime., Disp: , Rfl:   Consent:   N/A  Disposition:   As needed   Her questions and concerns were addressed to her satisfaction. She voices understanding of the recommendations provided during this encounter.    Signed, Olinda Bertrand, DO, [facility]  [facility]  [address] [place], [place] [address] 11/10/2023 6:51 PM

## 2023-11-10 NOTE — Patient Instructions (Signed)
 Medication Instructions:  Your physician recommends that you continue on your current medications as directed. Please refer to the Current Medication list given to you today.  *If you need a refill on your cardiac medications before your next appointment, please call your pharmacy*  Lab Work: None ordered today. If you have labs (blood work) drawn today and your tests are completely normal, you will receive your results only by: MyChart Message (if you have MyChart) OR A paper copy in the mail If you have any lab test that is abnormal or we need to change your treatment, we will call you to review the results.  Testing/Procedures: None ordered today.  Follow-Up: At [facility], you and your health needs are our priority.  As part of our continuing mission to provide you with exceptional heart care, we have created designated Provider Care Teams.  These Care Teams include your primary Cardiologist (physician) and Advanced Practice Providers (APPs -  Physician Assistants and Nurse Practitioners) who all work together to provide you with the care you need, when you need it.  We recommend signing up for the patient portal called "MyChart".  Sign up information is provided on this After Visit Summary.  MyChart is used to connect with patients for Virtual Visits (Telemedicine).  Patients are able to view lab/test results, encounter notes, upcoming appointments, etc.  Non-urgent messages can be sent to your provider as well.   To learn more about what you can do with MyChart, go to [email or web address].    Your next appointment:   As needed   The format for your next appointment:   In Person  Provider:  Dr. Albert Huff  Other Instructions   1st Floor: - Lobby - Registration  - Pharmacy  - Lab - Cafe  2nd Floor: - PV Lab - Diagnostic Testing (echo, CT, nuclear med)  3rd Floor: - Vacant  4th Floor: - TCTS (cardiothoracic surgery) - AFib Clinic - Structural Heart Clinic -  Vascular Surgery  - Vascular Ultrasound  5th Floor: - HeartCare Cardiology (general and EP) - Clinical Pharmacy for coumadin, hypertension, lipid, weight-loss medications, and med management appointments    Valet parking services will be available as well.

## 2023-11-26 ENCOUNTER — Encounter: Payer: Self-pay | Admitting: Endocrinology

## 2023-11-26 ENCOUNTER — Ambulatory Visit: Payer: Medicaid Other | Admitting: Endocrinology

## 2023-11-26 VITALS — BP 116/62 | HR 52 | Resp 20 | Ht 65.0 in | Wt 277.6 lb

## 2023-11-26 DIAGNOSIS — E119 Type 2 diabetes mellitus without complications: Secondary | ICD-10-CM

## 2023-11-26 DIAGNOSIS — Z7985 Long-term (current) use of injectable non-insulin antidiabetic drugs: Secondary | ICD-10-CM

## 2023-11-26 DIAGNOSIS — Z7984 Long term (current) use of oral hypoglycemic drugs: Secondary | ICD-10-CM

## 2023-11-26 LAB — POCT GLYCOSYLATED HEMOGLOBIN (HGB A1C): Hemoglobin A1C: 5.2 % (ref 4.0–5.6)

## 2023-11-26 NOTE — Progress Notes (Signed)
 REASON OF VISIT: Follow-up of type 2 diabetes mellitus  REFERRING PROVIDER: Havery Lions, CNM  PCP: Havery Lions, CNM  HISTORY OF PRESENT ILLNESS:   Shannon Lopez is a 33 y.o. old female with past medical history listed below, is here for follow-up of type 2 diabetes mellitus.   Pertinent Diabetes History: Patient states she had prediabetes seen 2021/2022 with hemoglobin A1c in the range of 6%.  When she was pregnant in 2023, she was diagnosed with gestational diabetes mellitus at 34 weeks of gestation, has significantly high blood sugar, and was treated with metformin . She continued metformin  after delivery and presented today for further evaluation and management of type 2 diabetes mellitus.  Initial consult was in October 2024.  Hemoglobin A1c was 6.5% in January 07, 2023 in the outside facility, records brought by the patient and reviewed in her phone, consistent with type 2 diabetes mellitus.  No personal history of pancreatitis.  No family history of medullary thyroid  carcinoma and MEM 2 syndrome.  Previous diabetes education: No  Chronic Diabetes Complications : Retinopathy: no. Last ophthalmology exam was done, not done.  Nephropathy: no Peripheral neuropathy: no, Coronary artery disease: no Stroke: no  Relevant comorbidities and cardiovascular risk factors: Obesity: yes Body mass index is 46.2 kg/m.  Hypertension: no Hyperlipidemia. no Tobacco smoking:  Social History   Tobacco Use  Smoking Status Former   Current packs/day: 0.00   Types: Cigarettes   Quit date: 2015   Years since quitting: 10.3  Smokeless Tobacco Never   Hypothyroidism: no  Current Diabetic regimen includes: Metformin  1000 mg 2 times a day. Ozempic  2 mg weekly.  Prior medications: Mounjaro  was not covered by medical insurance.  Factors modifying glucose control: 1.  Diabetic diet assessment: She has been watching and following low carbohydrate meals, she has also been practicing fasting  several hours in a day.  2.  Staying active or exercising:   3.  Medication compliance: compliant all of the time.  Glycemic data: No glucometer data to review.  Has not been checking blood sugar lately.  Interval history Patient has been taking Ozempic  and tolerating well.  Reviewed and noted diabetes regimen as above.  Hemoglobin A1c 5.2%, remained controlled.  She lost few pounds of weight in last 4 months.  Overall feeling good.  No plan for pregnancy.  No other complaints today.  Last time weight was taken with holding the baby.  She has not been monitoring blood sugar at home.  No other complaints today.  REVIEW OF SYSTEMS As per history of present illness.   PAST MEDICAL HISTORY: Past Medical History:  Diagnosis Date   Allergy    Asthma    Depression 06/18/2015   Morbid obesity with BMI of 40.0-44.9, adult [facility])     PAST SURGICAL HISTORY: Past Surgical History:  Procedure Laterality Date   CESAREAN SECTION N/A 12/03/2022   Procedure: CESAREAN SECTION;  Surgeon: Renea Carrion, MD;  Location: [facility] LD [facility]S;  Service: Obstetrics;  Laterality: N/A;    ALLERGIES: Allergies  Allergen Reactions   Latex Hives    FAMILY HISTORY:  Family History  Problem Relation Age of Onset   Hyperlipidemia Father    Heart disease Father    Diabetes Father    Irritable bowel syndrome Father    Diabetes Maternal Grandmother        both sides of the family   Irritable bowel syndrome Sister     SOCIAL HISTORY: Social History   Socioeconomic History   Marital  status: Married    Spouse name: Not on file   Number of children: 0   Years of education: Not on file   Highest education level: Not on file  Occupational History   Occupation: student  Tobacco Use   Smoking status: Former    Current packs/day: 0.00    Types: Cigarettes    Quit date: 2015    Years since quitting: 10.3   Smokeless tobacco: Never  Vaping Use   Vaping status: Never Used  Substance and Sexual Activity    Alcohol use: No    Alcohol/week: 0.0 standard drinks of alcohol   Drug use: No   Sexual activity: Not Currently    Comment: last IC withinin past week  Other Topics Concern   Not on file  Social History Narrative   Not on file   Social Drivers of Health   Financial Resource Strain: Not on file  Food Insecurity: No Food Insecurity (05/28/2023)   Hunger Vital Sign    Worried About Running Out of Food in the Last Year: Never true    Ran Out of Food in the Last Year: Never true  Transportation Needs: No Transportation Needs (12/03/2022)   PRAPARE - Administrator, Civil Service (Medical): No    Lack of Transportation (Non-Medical): No  Physical Activity: Not on file  Stress: Not on file  Social Connections: Not on file    MEDICATIONS:  Current Outpatient Medications  Medication Sig Dispense Refill   acetaminophen  (TYLENOL ) 500 MG tablet Take 2 tablets (1,000 mg total) by mouth every 6 (six) hours. 30 tablet 0   albuterol  (PROVENTIL ) (5 MG/ML) 0.5% nebulizer solution Take 0.5 mLs (2.5 mg total) by nebulization every 6 (six) hours as needed for wheezing or shortness of breath. 20 mL 1   Beclomethasone Dipropionate (QVAR IN) Inhale 2 puffs into the lungs at bedtime.     Cholecalciferol (VITAMIN D3 ULTRA STRENGTH) 125 MCG (5000 UT) capsule Take 5,000 Units by mouth daily.     cyclobenzaprine  (FLEXERIL ) 5 MG tablet Take 5 mg by mouth 2 (two) times daily as needed for muscle spasms.     metFORMIN  (GLUCOPHAGE ) 500 MG tablet Take 1 tablet (500 mg total) by mouth 2 (two) times daily. 180 tablet 3   NIFEdipine  (ADALAT  CC) 60 MG 24 hr tablet Take 1 tablet (60 mg total) by mouth daily. (Patient taking differently: Take 60 mg by mouth as needed.) 30 tablet 1   pantoprazole  (PROTONIX ) 20 MG tablet Take 1 tablet (20 mg total) by mouth daily. 30 tablet 0   Prenatal Vit-Fe Fumarate-FA (PRENATAL VITAMINS PO) Take 2 tablets by mouth daily.     Semaglutide , 2 MG/DOSE, 8 MG/3ML SOPN Inject 2 mg  as directed once a week. 9 mL 4   No current facility-administered medications for this visit.    PHYSICAL EXAM: Vitals:   11/26/23 1019  BP: 116/62  Pulse: (!) 52  Resp: 20  SpO2: 99%  Weight: 277 lb 9.6 oz (125.9 kg)  Height: 5\' 5"  (1.651 m)     Body mass index is 46.2 kg/m.    General: Well developed, well nourished female in no apparent distress.  HEENT: AT/[place], no external lesions. Hearing intact to the spoken word Eyes: EOMI. Conjunctiva clear and no icterus. Neck: Trachea midline, neck supple  Abdomen: Soft Neurologic: Alert, oriented, normal speech Extremities: No pedal pitting edema Skin: Warm. + acanthosis nigricans Psychiatric: Does not appear depressed or anxiou  Diabetic Foot Exam -  Simple   No data filed    LABS Reviewed Lab Results  Component Value Date   HGBA1C 5.2 11/26/2023   No components found for: "MICROALDIP", "CREATURINE", "MICCRERAT"   ASSESSMENT / PLAN  1. Type 2 diabetes mellitus without complication, without long-term current use of insulin (HCC)      Diabetes Mellitus type 2, controlled. with no known complications.  -Hemoglobin A1c 5.2% has controlled type 2 diabetes mellitus.   Discussed that Ozempic  is contraindicated during breast-feeding an pregnancy.  Patient is currently not breast-feeding.  She has no plan for pregnancy.  Strongly advised for birth control method.  In case if she got pregnant advised to stop Ozempic  immediately.  - Medications:  I) continue metformin  1000 mg 2 times a day. II) continue Ozempic   2 mg weekly.  - Home glucose testing: Recommended at least few times a week at different times of the day. - Discussed/ Gave Hypoglycemia treatment plan - Annual urine for microalbuminuria/ creatinine ratio.  Will check urine microalbumin creatinine ratio today. - Foot check nightly . - Annual dilated diabetic eye exams, patient reports has appointment next week. - Diet: Make healthy diabetic food choices,  discussed in detail. - Life style / activity / exercise: Discussed.  2. Blood pressure  -  BP Readings from Last 1 Encounters:  11/26/23 116/62    - Control is in target.  - No change in current plans.  3. Lipid status / Hyperlipidemia -No indication of statin at this time.  Orders placed   Orders Placed This Encounter  Procedures   POCT glycosylated hemoglobin (Hb A1C)     DISPOSITION Follow up in clinic in 6 months suggested.   All questions answered and patient verbalized understanding of the plan.  Iraq Maleny Candy, MD [facility] Endocrinology [facility] [address] [place], [address] [place], [place] [address] Phone # [phone]  At least part of this note was generated using voice recognition software. Inadvertent word errors may have occurred, which were not recognized during the proofreading process.

## 2023-11-27 LAB — MICROALBUMIN / CREATININE URINE RATIO
Creatinine, Urine: 79 mg/dL (ref 20–275)
Microalb Creat Ratio: 129 mg/g{creat} — ABNORMAL HIGH (ref <30)
Microalb, Ur: 10.2 mg/dL

## 2023-11-29 ENCOUNTER — Other Ambulatory Visit: Payer: Self-pay | Admitting: Endocrinology

## 2023-11-29 ENCOUNTER — Telehealth: Payer: Self-pay

## 2023-11-29 ENCOUNTER — Other Ambulatory Visit: Payer: Self-pay

## 2023-11-29 DIAGNOSIS — E119 Type 2 diabetes mellitus without complications: Secondary | ICD-10-CM

## 2023-11-29 MED ORDER — LISINOPRIL 2.5 MG PO TABS: 2.5000 mg | ORAL_TABLET | Freq: Every day | ORAL | 3 refills | Status: AC

## 2023-11-29 NOTE — Telephone Encounter (Signed)
-----   Message from Iraq Thapa sent at 11/29/2023 10:25 AM EDT ----- Please notify patient of labs reviewed she has elevated urine microalbumin creatinine ratio which is an indication of kidney is affecting by the diabetes.  I would like to start on the low-dose of kidney protective medication called lisinopril 2.5 mg daily.  Recommend to monitor blood pressure 1 week after being on the lisinopril.

## 2023-11-29 NOTE — Telephone Encounter (Signed)
 Patient given results and medication changes as directed by MD.

## 2023-11-29 NOTE — Telephone Encounter (Signed)
 Blood test result of protein in the urine indicates diabetes has affected kidney however it has not gone into the kidney failure.  Actual function of the kidneys is still intact.  The medicine I have sent called lisinopril helps to protect kidney from the diabetes.  Lisinopril was sent to [facility].  Iraq Rayann Jolley, MD [facility] Endocrinology [facility] [address] [place], [address] [place], [place] [address] Phone # [phone]

## 2023-12-23 MED ORDER — TIRZEPATIDE 15 MG/0.5ML ~~LOC~~ SOAJ
15.0000 mg | SUBCUTANEOUS | 4 refills | Status: DC
Start: 2023-12-23 — End: 2024-03-23

## 2023-12-23 NOTE — Telephone Encounter (Signed)
 Sent prescription for Mounjaro  15 mg weekly.  This is equivalent dose to Ozempic  2 mg weekly.

## 2023-12-28 ENCOUNTER — Telehealth: Payer: Self-pay | Admitting: Pharmacy Technician

## 2023-12-28 ENCOUNTER — Other Ambulatory Visit (HOSPITAL_COMMUNITY): Payer: Self-pay

## 2023-12-28 DIAGNOSIS — E119 Type 2 diabetes mellitus without complications: Secondary | ICD-10-CM

## 2023-12-28 NOTE — Telephone Encounter (Signed)
 Pharmacy Patient Advocate Encounter  Received notification from [facility] Medicaid that Prior Authorization for Mounjaro  15MG /0.5ML auto-injectors has been DENIED.  Full denial letter will be uploaded to the media tab. See denial reason below.    PA #/Case ID/Reference #: [id number]

## 2023-12-28 NOTE — Telephone Encounter (Signed)
 Pharmacy Patient Advocate Encounter   Received notification from CoverMyMeds that prior authorization for Mounjaro  15MG /0.5ML auto-injectors is required/requested.   Insurance verification completed.   The patient is insured through [facility] [place] .   Per test claim: PA required; PA submitted to above mentioned insurance via CoverMyMeds Key/confirmation #/EOC [id number] Status is pending

## 2023-12-30 MED ORDER — TRULICITY 4.5 MG/0.5ML ~~LOC~~ SOAJ
4.5000 mg | SUBCUTANEOUS | 3 refills | Status: DC
  Filled 2024-01-06: qty 6, 84d supply, fill #0

## 2023-12-30 NOTE — Telephone Encounter (Signed)
 Sent prescription for Trulicity 4.5 milligram weekly.

## 2024-01-03 ENCOUNTER — Telehealth: Payer: Self-pay

## 2024-01-03 ENCOUNTER — Other Ambulatory Visit (HOSPITAL_COMMUNITY): Payer: Self-pay

## 2024-01-03 NOTE — Telephone Encounter (Signed)
 Pharmacy Patient Advocate Encounter   Received notification from CoverMyMeds that prior authorization for Trulicity  4.5MG /0.5ML auto-injectors is required/requested.   Insurance verification completed.   The patient is insured through [facility] [place] .   Per test claim: PA required; PA submitted to above mentioned insurance via CoverMyMeds Key/confirmation #/EOC [id number] Status is pending

## 2024-01-03 NOTE — Telephone Encounter (Signed)
 Pharmacy Patient Advocate Encounter  Received notification from [facility] Medicaid that Prior Authorization for  Trulicity  4.5MG /0.5ML auto-injectors  has been APPROVED from 01/03/24 to 01/02/25. Ran test claim, Copay is $4. This test claim was processed through [facility]- copay amounts may vary at other pharmacies due to pharmacy/plan contracts, or as the patient moves through the different stages of their insurance plan.   PA #/Case ID/Reference #: [id number]

## 2024-01-06 ENCOUNTER — Other Ambulatory Visit (HOSPITAL_BASED_OUTPATIENT_CLINIC_OR_DEPARTMENT_OTHER): Payer: Self-pay

## 2024-01-28 ENCOUNTER — Other Ambulatory Visit: Payer: Self-pay

## 2024-01-28 ENCOUNTER — Telehealth: Payer: Self-pay

## 2024-01-28 DIAGNOSIS — E119 Type 2 diabetes mellitus without complications: Secondary | ICD-10-CM

## 2024-01-28 MED ORDER — OZEMPIC (0.25 OR 0.5 MG/DOSE) 2 MG/3ML ~~LOC~~ SOPN: PEN_INJECTOR | SUBCUTANEOUS | 1 refills | Status: DC

## 2024-01-28 NOTE — Telephone Encounter (Signed)
 Pharmacy Patient Advocate Encounter   Received notification from CoverMyMeds that prior authorization for Ozempic  2mg  is required/requested.   Insurance verification completed.   The patient is insured through [facility] .   Per test claim: PA required; PA submitted to above mentioned insurance via CoverMyMeds Key/confirmation #/EOC [facility] Status is pending

## 2024-02-02 NOTE — Telephone Encounter (Signed)
 Pharmacy Patient Advocate Encounter  Received notification from [facility] that Prior Authorization for Ozempic  2mg  has been APPROVED from 01/14/24 to 01/27/25

## 2024-02-16 ENCOUNTER — Other Ambulatory Visit: Payer: Self-pay | Admitting: Endocrinology

## 2024-02-17 MED ORDER — SEMAGLUTIDE (2 MG/DOSE) 8 MG/3ML ~~LOC~~ SOPN: 2.0000 mg | PEN_INJECTOR | SUBCUTANEOUS | 11 refills | Status: DC

## 2024-02-17 NOTE — Telephone Encounter (Signed)
 I have sent a prescription for Ozempic  2 mg weekly.

## 2024-02-17 NOTE — Addendum Note (Signed)
 Addended by: Tobin Witucki, IRAQ on: 02/17/2024 01:19 PM   Modules accepted: Orders

## 2024-02-22 ENCOUNTER — Other Ambulatory Visit: Payer: Self-pay

## 2024-02-22 ENCOUNTER — Telehealth: Payer: Self-pay

## 2024-02-22 ENCOUNTER — Encounter: Payer: Self-pay | Admitting: Endocrinology

## 2024-02-22 ENCOUNTER — Other Ambulatory Visit (HOSPITAL_COMMUNITY): Payer: Self-pay

## 2024-02-22 DIAGNOSIS — E119 Type 2 diabetes mellitus without complications: Secondary | ICD-10-CM

## 2024-02-22 MED ORDER — SEMAGLUTIDE (2 MG/DOSE) 8 MG/3ML ~~LOC~~ SOPN: 2.0000 mg | PEN_INJECTOR | SUBCUTANEOUS | 11 refills | Status: AC

## 2024-02-22 NOTE — Telephone Encounter (Signed)
 PA team does not call patients.   Her insurance is not filling because she just picked up the lower dose on 02/01/24 and it is too soon to refill.

## 2024-02-22 NOTE — Telephone Encounter (Signed)
 Sent to PA team.

## 2024-03-08 ENCOUNTER — Other Ambulatory Visit (HOSPITAL_COMMUNITY): Payer: Self-pay

## 2024-03-08 ENCOUNTER — Telehealth: Payer: Self-pay

## 2024-03-08 NOTE — Telephone Encounter (Signed)
 Pharmacy Patient Advocate Encounter   Received notification from Patient Advice Request messages that prior authorization for Ozempic  (2 MG/DOSE) 8MG /3ML pen-injectors is required/requested.   Insurance verification completed.   The patient is insured through [facility] .   Per test claim: PA required; PA submitted to above mentioned insurance via Latent Key/confirmation #/EOC [id number] Status is pending

## 2024-03-13 NOTE — Telephone Encounter (Signed)
 Pharmacy Patient Advocate Encounter  Received notification from [facility] that Prior Authorization for Ozempic  (2 MG/DOSE) 8MG /3ML pen-injectors  has been APPROVED from 02/23/24 to 03/08/2025

## 2024-03-21 NOTE — Telephone Encounter (Signed)
 Some of the times, GI related side effect can get better over time. If your constipation remained to be long-term and persistent and you are concerned about taking stool softener we had to decrease the dose of Ozempic  and and see if that helps.  Please let me know.

## 2024-03-21 NOTE — Telephone Encounter (Signed)
 This could be side effect from the Ozempic .  You can try over-the-counter stool softer for example colase, Dulcolax or MiraLAX.  If still not helping we may have to decrease the dose of Ozempic .

## 2024-03-22 NOTE — Telephone Encounter (Signed)
 We can discuss this on follow-up visit with me tomorrow.

## 2024-03-23 ENCOUNTER — Ambulatory Visit: Payer: Self-pay | Admitting: Endocrinology

## 2024-03-23 ENCOUNTER — Encounter: Payer: Self-pay | Admitting: Endocrinology

## 2024-03-23 ENCOUNTER — Ambulatory Visit: Admitting: Endocrinology

## 2024-03-23 VITALS — BP 100/60 | HR 51 | Resp 20 | Ht 65.0 in | Wt 251.4 lb

## 2024-03-23 DIAGNOSIS — E119 Type 2 diabetes mellitus without complications: Secondary | ICD-10-CM | POA: Diagnosis not present

## 2024-03-23 DIAGNOSIS — Z7985 Long-term (current) use of injectable non-insulin antidiabetic drugs: Secondary | ICD-10-CM

## 2024-03-23 DIAGNOSIS — Z7984 Long term (current) use of oral hypoglycemic drugs: Secondary | ICD-10-CM | POA: Diagnosis not present

## 2024-03-23 LAB — POCT GLYCOSYLATED HEMOGLOBIN (HGB A1C): Hemoglobin A1C: 5.2 % (ref 4.0–5.6)

## 2024-03-23 NOTE — Progress Notes (Signed)
 REASON OF VISIT: Follow-up of type 2 diabetes mellitus  REFERRING PROVIDER: Verta Blossom, CNM  PCP: Verta Blossom, CNM  HISTORY OF PRESENT ILLNESS:   Shannon Lopez is a 33 y.o. old female with past medical history listed below, is here for follow-up of type 2 diabetes mellitus.   Pertinent Diabetes History: Patient states she had prediabetes seen 2021/2022 with hemoglobin A1c in the range of 6%.  When she was pregnant in 2023, she was diagnosed with gestational diabetes mellitus at 34 weeks of gestation, has significantly high blood sugar, and was treated with metformin . She continued metformin  after delivery and presented for further evaluation and management of type 2 diabetes mellitus.  Initial consult was in August 2024.  Hemoglobin A1c was 6.5% in January 07, 2023 in the outside facility, records brought by the patient and reviewed in her phone, consistent with type 2 diabetes mellitus.  Started on GLP1 RA in 02/2023.  No personal history of pancreatitis.  No family history of medullary thyroid  carcinoma and MEM 2 syndrome.  Previous diabetes education: No  Chronic Diabetes Complications : Retinopathy: no. Last ophthalmology exam was done, not done.  Nephropathy: Microalbuminuria, on lisinopril  Peripheral neuropathy: no, Coronary artery disease: no Stroke: no  Relevant comorbidities and cardiovascular risk factors: Obesity: yes Body mass index is 41.84 kg/m.  Hypertension: no Hyperlipidemia. no Tobacco smoking:  Social History   Tobacco Use  Smoking Status Former   Current packs/day: 0.00   Types: Cigarettes   Quit date: 2015   Years since quitting: 10.6  Smokeless Tobacco Never   Hypothyroidism: no  Current Diabetic regimen includes: Metformin  1000 mg 2 times a day.  Not taking regularly. Ozempic  2 mg weekly.  Prior medications: Mounjaro  was not covered by medical insurance. Changed to trulicity  and then changed to Ozempic .  Factors modifying glucose control: 1.   Diabetic diet assessment: She has been watching and following low carbohydrate meals, she has also been practicing fasting several hours in a day.  2.  Staying active or exercising:   3.  Medication compliance: compliant all of the time.  Glycemic data: No glucometer data to review.  Has not been checking blood sugar lately.  Interval history Patient has been taking Ozempic  2 mg weekly and tolerating well.  She has been on metformin  however has not been taking regularly due to occasional stomach upset.  Recently she had constipation, had tried over-the-counter stool softener Dulcolax, recently started to eat more fiber diet and getting better.  She lost about 20 pounds of weight in last 6 months.  Hemoglobin A1c today 5.2% controlled diabetes.  She has no plans for pregnancy in the near future.  No other complaints today.  REVIEW OF SYSTEMS As per history of present illness.   PAST MEDICAL HISTORY: Past Medical History:  Diagnosis Date   Allergy    Asthma    Depression 06/18/2015   Morbid obesity with BMI of 40.0-44.9, adult [facility])     PAST SURGICAL HISTORY: Past Surgical History:  Procedure Laterality Date   CESAREAN SECTION N/A 12/03/2022   Procedure: CESAREAN SECTION;  Surgeon: Henry Slough, MD;  Location: [facility] LD ORS;  Service: Obstetrics;  Laterality: N/A;    ALLERGIES: Allergies  Allergen Reactions   Latex Hives    FAMILY HISTORY:  Family History  Problem Relation Age of Onset   Hyperlipidemia Father    Heart disease Father    Diabetes Father    Irritable bowel syndrome Father    Diabetes Maternal Grandmother  both sides of the family   Irritable bowel syndrome Sister     SOCIAL HISTORY: Social History   Socioeconomic History   Marital status: Married    Spouse name: Not on file   Number of children: 0   Years of education: Not on file   Highest education level: Not on file  Occupational History   Occupation: student  Tobacco Use   Smoking  status: Former    Current packs/day: 0.00    Types: Cigarettes    Quit date: 2015    Years since quitting: 10.6   Smokeless tobacco: Never  Vaping Use   Vaping status: Never Used  Substance and Sexual Activity   Alcohol use: No    Alcohol/week: 0.0 standard drinks of alcohol   Drug use: No   Sexual activity: Not Currently    Comment: last IC withinin past week  Other Topics Concern   Not on file  Social History Narrative   Not on file   Social Drivers of Health   Financial Resource Strain: Not on file  Food Insecurity: No Food Insecurity (05/28/2023)   Hunger Vital Sign    Worried About Running Out of Food in the Last Year: Never true    Ran Out of Food in the Last Year: Never true  Transportation Needs: No Transportation Needs (12/03/2022)   PRAPARE - Administrator, Civil Service (Medical): No    Lack of Transportation (Non-Medical): No  Physical Activity: Not on file  Stress: Not on file  Social Connections: Not on file    MEDICATIONS:  Current Outpatient Medications  Medication Sig Dispense Refill   acetaminophen  (TYLENOL ) 500 MG tablet Take 2 tablets (1,000 mg total) by mouth every 6 (six) hours. 30 tablet 0   albuterol  (PROVENTIL ) (5 MG/ML) 0.5% nebulizer solution Take 0.5 mLs (2.5 mg total) by nebulization every 6 (six) hours as needed for wheezing or shortness of breath. 20 mL 1   Beclomethasone Dipropionate (QVAR IN) Inhale 2 puffs into the lungs at bedtime.     Cholecalciferol (VITAMIN D3 ULTRA STRENGTH) 125 MCG (5000 UT) capsule Take 5,000 Units by mouth daily.     cyclobenzaprine  (FLEXERIL ) 5 MG tablet Take 5 mg by mouth 2 (two) times daily as needed for muscle spasms.     lisinopril  (ZESTRIL ) 2.5 MG tablet Take 1 tablet (2.5 mg total) by mouth daily. 90 tablet 3   metFORMIN  (GLUCOPHAGE ) 500 MG tablet TAKE 1 TABLET BY MOUTH TWICE DAILY. 180 tablet 3   NIFEdipine  (ADALAT  CC) 60 MG 24 hr tablet Take 1 tablet (60 mg total) by mouth daily. 30 tablet 1    Prenatal Vit-Fe Fumarate-FA (PRENATAL VITAMINS PO) Take 2 tablets by mouth daily.     Semaglutide , 2 MG/DOSE, 8 MG/3ML SOPN Inject 2 mg as directed once a week. 3 mL 11   No current facility-administered medications for this visit.    PHYSICAL EXAM: Vitals:   03/23/24 1103  BP: 100/60  Pulse: (!) 51  Resp: 20  SpO2: 98%  Weight: 251 lb 6.4 oz (114 kg)  Height: 5' 5 (1.651 m)     Body mass index is 41.84 kg/m.    General: Well developed, well nourished female in no apparent distress.  HEENT: AT/[place], no external lesions. Hearing intact to the spoken word Eyes: EOMI. Conjunctiva clear and no icterus. Neck: Trachea midline, neck supple  Abdomen: Soft Neurologic: Alert, oriented, normal speech Extremities: No pedal pitting edema Skin: Warm. + acanthosis nigricans Psychiatric:  Does not appear depressed or anxiou  Diabetic Foot Exam - Simple   Simple Foot Form Diabetic Foot exam was performed with the following findings: Yes 03/23/2024 11:47 AM  Visual Inspection No deformities, no ulcerations, no other skin breakdown bilaterally: Yes Sensation Testing Intact to touch and monofilament testing bilaterally: Yes Pulse Check Posterior Tibialis and Dorsalis pulse intact bilaterally: Yes Comments Mild callus on left feet.     LABS Reviewed Lab Results  Component Value Date   HGBA1C 5.2 03/23/2024   No components found for: MICROALDIP, CREATURINE, MICCRERAT   ASSESSMENT / PLAN  1. Type 2 diabetes mellitus without complication, without long-term current use of insulin (HCC)     Diabetes Mellitus type 2, controlled. with no known complications.  -Hemoglobin A1c 5.2% has controlled type 2 diabetes mellitus.   Discussed that Ozempic  is contraindicated during breast-feeding an pregnancy.  Patient is currently not breast-feeding.  She has no plan for pregnancy.  Strongly advised for birth control method.  In case if she got pregnant advised to stop Ozempic  immediately.  We  discussed today.  - Medications:  I) continue metformin  1000 mg 2 times a day. II) continue Ozempic   2 mg weekly.  - Home glucose testing: Recommended at least few times a week at different times of the day.  - Discussed/ Gave Hypoglycemia treatment plan - Annual urine for microalbuminuria/ creatinine ratio.  Microalbuminuria present.  Lisinopril  was started, recheck urine microalbumin creatinine ratio today. - Foot check nightly . - Annual dilated diabetic eye exams, patient reports has appointment next week. - Diet: Make healthy diabetic food choices, discussed in detail. - Life style / activity / exercise: Discussed.  2. Blood pressure  -  BP Readings from Last 1 Encounters:  03/23/24 100/60    - Control is in target.  - No change in current plans.  3. Lipid status / Hyperlipidemia -No indication of statin at this time.  Orders placed   Orders Placed This Encounter  Procedures   Microalbumin / creatinine urine ratio   POCT glycosylated hemoglobin (Hb A1C)     DISPOSITION Follow up in clinic in 6 months suggested.   All questions answered and patient verbalized understanding of the plan.  Iraq Mkenzie Dotts, MD [facility] Endocrinology [facility] [address] [place], [address] [place], [place] [address] Phone # [phone]  At least part of this note was generated using voice recognition software. Inadvertent word errors may have occurred, which were not recognized during the proofreading process.

## 2024-03-24 LAB — MICROALBUMIN / CREATININE URINE RATIO
Creatinine, Urine: 116 mg/dL (ref 20–275)
Microalb Creat Ratio: 49 mg/g{creat} — ABNORMAL HIGH (ref <30)
Microalb, Ur: 5.7 mg/dL

## 2024-04-06 ENCOUNTER — Other Ambulatory Visit: Payer: Self-pay

## 2024-04-06 ENCOUNTER — Encounter (HOSPITAL_BASED_OUTPATIENT_CLINIC_OR_DEPARTMENT_OTHER): Payer: Self-pay | Admitting: Emergency Medicine

## 2024-04-06 ENCOUNTER — Emergency Department (HOSPITAL_BASED_OUTPATIENT_CLINIC_OR_DEPARTMENT_OTHER): Admitting: Radiology

## 2024-04-06 ENCOUNTER — Emergency Department (HOSPITAL_BASED_OUTPATIENT_CLINIC_OR_DEPARTMENT_OTHER)
Admission: EM | Admit: 2024-04-06 | Discharge: 2024-04-07 | Disposition: A | Discharge status: Home / Self Care | Attending: Emergency Medicine | Admitting: Emergency Medicine

## 2024-04-06 DIAGNOSIS — R0789 Other chest pain: Secondary | ICD-10-CM

## 2024-04-06 DIAGNOSIS — J45909 Unspecified asthma, uncomplicated: Secondary | ICD-10-CM | POA: Insufficient documentation

## 2024-04-06 DIAGNOSIS — M940 Chondrocostal junction syndrome [Tietze]: Secondary | ICD-10-CM | POA: Insufficient documentation

## 2024-04-06 DIAGNOSIS — Z7952 Long term (current) use of systemic steroids: Secondary | ICD-10-CM | POA: Diagnosis not present

## 2024-04-06 DIAGNOSIS — Z87891 Personal history of nicotine dependence: Secondary | ICD-10-CM | POA: Insufficient documentation

## 2024-04-06 DIAGNOSIS — R079 Chest pain, unspecified: Secondary | ICD-10-CM | POA: Diagnosis present

## 2024-04-06 NOTE — ED Triage Notes (Signed)
  Patient comes in with L sided chest pain and back pain that has been going on for about 3 days.  Denies any N/V, diaphoresis, or syncope.  States she moved the day before the pain started and did some heavy lifting.  Pain 8/10, aching/sharp.  Took her nifedipine  and baby aspirin  around 2000.

## 2024-04-07 LAB — CBC
HCT: 38.3 % (ref 36.0–46.0)
Hemoglobin: 13.2 g/dL (ref 12.0–15.0)
MCH: 28.4 pg (ref 26.0–34.0)
MCHC: 34.5 g/dL (ref 30.0–36.0)
MCV: 82.4 fL (ref 80.0–100.0)
Platelets: 298 K/uL (ref 150–400)
RBC: 4.65 MIL/uL (ref 3.87–5.11)
RDW: 13.6 % (ref 11.5–15.5)
WBC: 6.3 K/uL (ref 4.0–10.5)
nRBC: 0 % (ref 0.0–0.2)

## 2024-04-07 LAB — TROPONIN T, HIGH SENSITIVITY
Troponin T High Sensitivity: <15 ng/L (ref 0–19)
Troponin T High Sensitivity: <15 ng/L (ref 0–19)

## 2024-04-07 LAB — BASIC METABOLIC PANEL WITH GFR
Anion gap: 13 (ref 5–15)
BUN: 13 mg/dL (ref 6–20)
CO2: 21 mmol/L — ABNORMAL LOW (ref 22–32)
Calcium: 9.5 mg/dL (ref 8.9–10.3)
Chloride: 103 mmol/L (ref 98–111)
Creatinine, Ser: 0.66 mg/dL (ref 0.44–1.00)
GFR, Estimated: >60 mL/min (ref >60)
Glucose, Bld: 93 mg/dL (ref 70–99)
Potassium: 4 mmol/L (ref 3.5–5.1)
Sodium: 138 mmol/L (ref 135–145)

## 2024-04-07 LAB — PREGNANCY, URINE: Preg Test, Ur: NEGATIVE

## 2024-04-07 MED ORDER — KETOROLAC TROMETHAMINE 30 MG/ML IJ SOLN
15.0000 mg | Freq: Once | INTRAMUSCULAR | Status: AC
  Administered 2024-04-07: 15 mg via INTRAVENOUS
  Filled 2024-04-07: qty 1

## 2024-04-07 MED ORDER — IPRATROPIUM-ALBUTEROL 0.5-2.5 (3) MG/3ML IN SOLN
3.0000 mL | RESPIRATORY_TRACT | Status: DC
  Administered 2024-04-07: 3 mL via RESPIRATORY_TRACT
  Filled 2024-04-07: qty 3

## 2024-04-07 MED ORDER — PREDNISONE 20 MG PO TABS: ORAL_TABLET | ORAL | 0 refills | Status: AC

## 2024-04-07 MED ORDER — PREDNISONE 50 MG PO TABS
60.0000 mg | ORAL_TABLET | Freq: Once | ORAL | Status: AC
  Administered 2024-04-07: 60 mg via ORAL
  Filled 2024-04-07: qty 1

## 2024-04-07 NOTE — ED Provider Notes (Signed)
 Emergency Department Provider Note  TRIAGE NOTE:  Patient comes in with L sided chest pain and back pain that has been going on for about 3 days.  Denies any N/V, diaphoresis, or syncope.  States she moved the day before the pain started and did some heavy lifting.  Pain 8/10, aching/sharp.  Took her nifedipine  and baby aspirin  around 2000.    HISTORY  Chief Complaint Chest Pain   HPI Shannon Lopez is a 33 y.o. female with  a chief complaint of chest pain persisting for the past three days. The pain began after a recent move, which involved significant physical exertion. Initially, the patient attempted to manage the pain with cyclobenzaprine , suspecting it might be due to fatigue and muscle strain from the move. The pain is described as sharp, alternating with a dull ache and throbbing sensation, and is exacerbated by deep breathing. The patient reports a recent history of illness with a cough, which has persisted, although it is not dry and is accompanied by mucus. She denies any leg swelling but notes some shortness of breath, likely due to pain inhibiting deep breaths. The patient has a history of preeclampsia and was diagnosed with type 2 diabetes post-pregnancy, which she has since managed through lifestyle changes. She is currently on lisinopril  for previously noted high protein in urinalysis. The patient took aspirin  and nifedipine  at home due to elevated blood pressure readings. She reports feeling lightheaded and fatigued when her blood pressure was elevated. The history was obtained from the patient and her husband.  PMH Past Medical History:  Diagnosis Date   Allergy    Asthma    Depression 06/18/2015   Morbid obesity with BMI of 40.0-44.9, adult (HCC)     Home Medications Prior to Admission medications   Medication Sig Start Date End Date Taking? Authorizing Provider  predniSONE  (DELTASONE ) 20 MG tablet 2 tabs po daily x 4 days 04/08/24  Yes Darian Ace, Selinda, MD   acetaminophen  (TYLENOL ) 500 MG tablet Take 2 tablets (1,000 mg total) by mouth every 6 (six) hours. 12/06/22   Armond Cape, MD  albuterol  (PROVENTIL ) (5 MG/ML) 0.5% nebulizer solution Take 0.5 mLs (2.5 mg total) by nebulization every 6 (six) hours as needed for wheezing or shortness of breath. 03/04/22   Nanavati, Ankit, MD  Beclomethasone Dipropionate (QVAR IN) Inhale 2 puffs into the lungs at bedtime.    [provider]  Cholecalciferol (VITAMIN D3 ULTRA STRENGTH) 125 MCG (5000 UT) capsule Take 5,000 Units by mouth daily.    [provider]  cyclobenzaprine  (FLEXERIL ) 5 MG tablet Take 5 mg by mouth 2 (two) times daily as needed for muscle spasms. 11/12/22   [provider]  lisinopril  (ZESTRIL ) 2.5 MG tablet Take 1 tablet (2.5 mg total) by mouth daily. 11/29/23   Thapa, [place], MD  metFORMIN  (GLUCOPHAGE ) 500 MG tablet TAKE 1 TABLET BY MOUTH TWICE DAILY. 02/16/24   Thapa, [place], MD  NIFEdipine  (ADALAT  CC) 60 MG 24 hr tablet Take 1 tablet (60 mg total) by mouth daily. 12/07/22   Armond Cape, MD  Prenatal Vit-Fe Fumarate-FA (PRENATAL VITAMINS PO) Take 2 tablets by mouth daily.    [provider]  Semaglutide , 2 MG/DOSE, 8 MG/3ML SOPN Inject 2 mg as directed once a week. 02/22/24   Thapa, [place], MD    Social History Social History   Tobacco Use   Smoking status: Former    Current packs/day: 0.00    Types: Cigarettes    Quit date: 2015  Years since quitting: 10.7   Smokeless tobacco: Never  Vaping Use   Vaping status: Never Used  Substance Use Topics   Alcohol use: No    Alcohol/week: 0.0 standard drinks of alcohol   Drug use: No    Review of Systems: Documented in HPI ____________________________________________  PHYSICAL EXAM: VITAL SIGNS: Triage: Blood pressure 109/64, pulse 64, temperature 98 F (36.7 C), temperature source Oral, resp. rate 17, height 5' 5 (1.651 m), weight 111.1 kg, last menstrual period 04/04/2024, SpO2 100%, not currently  breastfeeding.  Vitals:   04/07/24 0030 04/07/24 0103 04/07/24 0140 04/07/24 0221  BP: 112/81   109/64  Pulse: (!) 58  (!) 55 64  Resp: 15  14 17   Temp:    98 F (36.7 C)  TempSrc:    Oral  SpO2: 100% 100% 98% 100%  Weight:      Height:        Physical Exam Vitals and nursing note reviewed.  Constitutional:      Appearance: She is well-developed.  HENT:     Head: Normocephalic and atraumatic.  Cardiovascular:     Rate and Rhythm: Normal rate and regular rhythm.  Pulmonary:     Effort: No respiratory distress.     Breath sounds: No stridor. Examination of the left-upper field reveals decreased breath sounds and wheezing. Decreased breath sounds and wheezing present.  Abdominal:     General: There is no distension.  Musculoskeletal:     Cervical back: Normal range of motion.  Neurological:     Mental Status: She is alert.       ____________________________________________   LABS (all labs ordered are listed, but only abnormal results are displayed)  Labs Reviewed  BASIC METABOLIC PANEL WITH GFR - Abnormal; Notable for the following components:      Result Value   CO2 21 (*)    All other components within normal limits  CBC  PREGNANCY, URINE  TROPONIN T, HIGH SENSITIVITY  TROPONIN T, HIGH SENSITIVITY   ____________________________________________  EKG   EKG Interpretation Date/Time:    Ventricular Rate:    PR Interval:    QRS Duration:    QT Interval:    QTC Calculation:   R Axis:      Text Interpretation:          ____________________________________________  RADIOLOGY  DG Chest 2 View Result Date: 04/07/2024 CLINICAL DATA:  Left chest pain for 3 days. EXAM: CHEST - 2 VIEW COMPARISON:  PA Lat chest 09/03/2023, CTA chest 09/03/2023. FINDINGS: The heart size and mediastinal contours are within normal limits. Both lungs are clear. The visualized skeletal structures are unremarkable. IMPRESSION: No active cardiopulmonary disease.  Stable chest.  Electronically Signed   By: Francis Quam M.D.   On: 04/07/2024 00:02   ____________________________________________  PROCEDURES  Procedure(s) performed:   Procedures ____________________________________________  INITIAL IMPRESSION / ASSESSMENT AND PLAN   Patient has had sharp, alternating chest pain for the past three days, worsened with deep breaths, movement and flexing/stretching pectorals.  Initial DDx:       costochondritis, bronchitis, asthma, pneumonia, and pulmonary embolism.  ED Course   Patient is PERC negative making pulmonary embolus unlikely no further testing needed.  EKG and troponins reassuring low suspicion for ACS.  X-ray viewed interpreted by myself without evidence of pneumonia, pneumothorax or any other significant abnormalities I suspect that the asymmetric wheezing was from asthma/bronchitis which improved with DuoNeb so we will give her a dose of prednisone  which also should  help her likely costochondritis.  PCP follow-up if not improving.  Has albuterol  inhaler and nebulizer at home.      Images ordered viewed and obtained by myself. Agree with Radiology interpretation. Details in ED course.  Labs ordered reviewed by myself as detailed in ED course.  Consultations obtained/considered detailed in ED course.    FINAL IMPRESSION Final diagnoses:  Chest wall pain  Costochondritis     Disposition A medical screening exam was performed and I feel the patient has had an appropriate workup for their chief complaint at this time and likelihood of emergent condition existing is low. They have been counseled on decision, DISCHARGE, follow up and which symptoms necessitate immediate return to the emergency department. They or their family verbally stated understanding and agreement with plan and discharged in stable condition.   ____________________________________________   NEW OUTPATIENT MEDICATIONS STARTED DURING THIS VISIT:  Discharge Medication List  as of 04/07/2024  2:17 AM     START taking these medications   Details  predniSONE  (DELTASONE ) 20 MG tablet 2 tabs po daily x 4 days, Normal        Note:  This note was prepared with assistance of Dragon voice recognition software. Occasional wrong-word or sound-a-like substitutions may have occurred due to the inherent limitations of voice recognition software.    Carigan Lister, Selinda, MD 04/07/24 [phone]

## 2024-05-16 ENCOUNTER — Encounter: Payer: Self-pay | Admitting: Endocrinology

## 2024-05-17 NOTE — Telephone Encounter (Signed)
 If you were not on Ozempic  for about a month, it is okay to resume the previous dose.

## 2024-05-29 ENCOUNTER — Ambulatory Visit: Admitting: Endocrinology

## 2024-07-18 ENCOUNTER — Encounter: Payer: Self-pay | Admitting: Endocrinology

## 2024-08-16 ENCOUNTER — Ambulatory Visit: Admitting: Endocrinology

## 2024-08-16 ENCOUNTER — Ambulatory Visit: Payer: Self-pay | Admitting: Endocrinology

## 2024-08-16 ENCOUNTER — Encounter: Payer: Self-pay | Admitting: Endocrinology

## 2024-08-16 VITALS — BP 142/78 | HR 78 | Ht 65.0 in | Wt 262.0 lb

## 2024-08-16 DIAGNOSIS — E119 Type 2 diabetes mellitus without complications: Secondary | ICD-10-CM

## 2024-08-16 DIAGNOSIS — Z7984 Long term (current) use of oral hypoglycemic drugs: Secondary | ICD-10-CM | POA: Diagnosis not present

## 2024-08-16 DIAGNOSIS — Z6841 Body Mass Index (BMI) 40.0 and over, adult: Secondary | ICD-10-CM | POA: Diagnosis not present

## 2024-08-16 DIAGNOSIS — Z7985 Long-term (current) use of injectable non-insulin antidiabetic drugs: Secondary | ICD-10-CM

## 2024-08-16 LAB — POCT GLYCOSYLATED HEMOGLOBIN (HGB A1C): Hemoglobin A1C: 5.4 % (ref 4.0–5.6)

## 2024-08-16 LAB — POCT GLUCOSE (DEVICE FOR HOME USE): POC Glucose: 96 mg/dL (ref 70–99)

## 2024-08-16 MED ORDER — BLOOD GLUCOSE TEST VI STRP: 1.0000 | ORAL_STRIP | Freq: Every day | 3 refills | Status: AC

## 2024-08-16 MED ORDER — TIRZEPATIDE 10 MG/0.5ML ~~LOC~~ SOAJ: 10.0000 mg | SUBCUTANEOUS | 11 refills | Status: AC

## 2024-08-16 MED ORDER — TIRZEPATIDE 10 MG/0.5ML ~~LOC~~ SOAJ: 10.0000 mg | SUBCUTANEOUS | 11 refills | Status: DC

## 2024-08-16 MED ORDER — LANCETS MISC: 1.0000 | 0 refills | Status: AC

## 2024-08-16 MED ORDER — BLOOD GLUCOSE MONITORING SUPPL DEVI: 1.0000 | Freq: Every day | 0 refills | Status: AC

## 2024-08-16 MED ORDER — LANCET DEVICE MISC: 1.0000 | Freq: Every day | 0 refills | Status: AC

## 2024-08-16 NOTE — Progress Notes (Signed)
 REASON OF VISIT: Follow-up of type 2 diabetes mellitus  REFERRING PROVIDER: Verta Blossom, CNM  PCP: Verta Blossom, CNM  HISTORY OF PRESENT ILLNESS:   Shannon Lopez is a 34 y.o. old female with past medical history listed below, is here for follow-up of type 2 diabetes mellitus.   Pertinent Diabetes History: Patient states she had prediabetes seen 2021/2022 with hemoglobin A1c in the range of 6%.  When she was pregnant in 2023, she was diagnosed with gestational diabetes mellitus at 34 weeks of gestation, has significantly high blood sugar, and was treated with metformin . She continued metformin  after delivery and presented for further evaluation and management of type 2 diabetes mellitus.  Initial consult was in August 2024.  Hemoglobin A1c was 6.5% in January 07, 2023 in the outside facility, records brought by the patient and reviewed in her phone, consistent with type 2 diabetes mellitus.  Started on GLP1 RA in 02/2023.  No personal history of pancreatitis.  No family history of medullary thyroid  carcinoma and MEM 2 syndrome.  Previous diabetes education: No  Chronic Diabetes Complications : Retinopathy: no. Last ophthalmology exam was done, not done.  Nephropathy: Microalbuminuria, on lisinopril  Peripheral neuropathy: no, Coronary artery disease: no Stroke: no  Relevant comorbidities and cardiovascular risk factors: Obesity: yes Body mass index is 43.6 kg/m.  Hypertension: no Hyperlipidemia. no Tobacco smoking:  Social History   Tobacco Use  Smoking Status Former   Current packs/day: 0.00   Types: Cigarettes   Quit date: 2015   Years since quitting: 11.0  Smokeless Tobacco Never   Hypothyroidism: no  Current Diabetic regimen includes: Metformin  1000 mg 2 times a day.  Not taking lately.   Ozempic  2 mg weekly.  Prior medications: Mounjaro  was not covered by medical insurance. Changed to trulicity  and then changed to Ozempic .  Factors modifying glucose control: 1.   Diabetic diet assessment: She has been watching and following low carbohydrate meals, she has also been practicing fasting several hours in a day.  2.  Staying active or exercising:   3.  Medication compliance: compliant all of the time.  Glycemic data: No glucometer data to review.  Has not been checking blood sugar lately.  Interval history Hemoglobin A1c 5.4% today.  She gained about 10 pounds of weight in last 6 months.  She thinks Ozempic  is no longer helping to lose weight.  Diabetes is well-controlled.  She denies any stomach issue.  Due to occasional lightheadedness she is no longer taking metformin .  No glucose data to review.  She reports she does not have a glucometer.  No plans for pregnancy in the near future.  No other complaints today.  No numbness and ting of the feet.  No vision problem.  REVIEW OF SYSTEMS As per history of present illness.   PAST MEDICAL HISTORY: Past Medical History:  Diagnosis Date   Allergy    Asthma    Depression 06/18/2015   Morbid obesity with BMI of 40.0-44.9, adult [facility])     PAST SURGICAL HISTORY: Past Surgical History:  Procedure Laterality Date   CESAREAN SECTION N/A 12/03/2022   Procedure: CESAREAN SECTION;  Surgeon: Henry Slough, MD;  Location: [facility] LD ORS;  Service: Obstetrics;  Laterality: N/A;    ALLERGIES: Allergies  Allergen Reactions   Latex Hives    FAMILY HISTORY:  Family History  Problem Relation Age of Onset   Hyperlipidemia Father    Heart disease Father    Diabetes Father    Irritable bowel syndrome Father  Diabetes Maternal Grandmother        both sides of the family   Irritable bowel syndrome Sister     SOCIAL HISTORY: Social History   Socioeconomic History   Marital status: Married    Spouse name: Not on file   Number of children: 0   Years of education: Not on file   Highest education level: Not on file  Occupational History   Occupation: student  Tobacco Use   Smoking status: Former     Current packs/day: 0.00    Types: Cigarettes    Quit date: 2015    Years since quitting: 11.0   Smokeless tobacco: Never  Vaping Use   Vaping status: Never Used  Substance and Sexual Activity   Alcohol use: No    Alcohol/week: 0.0 standard drinks of alcohol   Drug use: No   Sexual activity: Not Currently    Comment: last IC withinin past week  Other Topics Concern   Not on file  Social History Narrative   Not on file   Social Drivers of Health   Tobacco Use: Medium Risk (08/16/2024)   Patient History    Smoking Tobacco Use: Former    Smokeless Tobacco Use: Never    Passive Exposure: Not on Actuary Strain: Not on file  Food Insecurity: No Food Insecurity (05/28/2023)   Hunger Vital Sign    Worried About Running Out of Food in the Last Year: Never true    Ran Out of Food in the Last Year: Never true  Transportation Needs: No Transportation Needs (12/03/2022)   PRAPARE - Administrator, Civil Service (Medical): No    Lack of Transportation (Non-Medical): No  Physical Activity: Not on file  Stress: Not on file  Social Connections: Not on file  Depression (PHQ2-9): Low Risk (05/28/2023)   Depression (PHQ2-9)    PHQ-2 Score: 0  Alcohol Screen: Not on file  Housing: Low Risk (12/03/2022)   Housing    Last Housing Risk Score: 0  Utilities: Not At Risk (12/03/2022)   AHC Utilities    Threatened with loss of utilities: No  Health Literacy: Not on file    MEDICATIONS:  Current Outpatient Medications  Medication Sig Dispense Refill   acetaminophen  (TYLENOL ) 500 MG tablet Take 2 tablets (1,000 mg total) by mouth every 6 (six) hours. 30 tablet 0   albuterol  (PROVENTIL ) (5 MG/ML) 0.5% nebulizer solution Take 0.5 mLs (2.5 mg total) by nebulization every 6 (six) hours as needed for wheezing or shortness of breath. 20 mL 1   Beclomethasone Dipropionate (QVAR IN) Inhale 2 puffs into the lungs at bedtime.     Blood Glucose Monitoring Suppl DEVI 1 each by Does  not apply route daily. May substitute to any manufacturer covered by patient's insurance. 1 each 0   Cholecalciferol (VITAMIN D3 ULTRA STRENGTH) 125 MCG (5000 UT) capsule Take 5,000 Units by mouth daily.     cyclobenzaprine  (FLEXERIL ) 5 MG tablet Take 5 mg by mouth 2 (two) times daily as needed for muscle spasms.     Glucose Blood (BLOOD GLUCOSE TEST STRIPS) STRP 1 each by In Vitro route daily. May substitute to any manufacturer covered by patient's insurance. 100 each 3   Lancet Device MISC 1 each by Does not apply route daily. May substitute to any manufacturer covered by patient's insurance. 1 each 0   Lancets MISC 1 each by Does not apply route as directed. Dispense based on patient and insurance preference.  Use up to four times daily as directed. (FOR ICD-10 E10.9, E11.9). 100 each 0   lisinopril  (ZESTRIL ) 2.5 MG tablet Take 1 tablet (2.5 mg total) by mouth daily. 90 tablet 3   Prenatal Vit-Fe Fumarate-FA (PRENATAL VITAMINS PO) Take 2 tablets by mouth daily.     Semaglutide , 2 MG/DOSE, 8 MG/3ML SOPN Inject 2 mg as directed once a week. 3 mL 11   NIFEdipine  (ADALAT  CC) 60 MG 24 hr tablet Take 1 tablet (60 mg total) by mouth daily. (Patient not taking: Reported on 08/16/2024) 30 tablet 1   predniSONE  (DELTASONE ) 20 MG tablet 2 tabs po daily x 4 days (Patient not taking: Reported on 08/16/2024) 8 tablet 0   tirzepatide  (MOUNJARO ) 10 MG/0.5ML Pen Inject 10 mg into the skin once a week. 2 mL 11   No current facility-administered medications for this visit.    PHYSICAL EXAM: Vitals:   08/16/24 0937  BP: (!) 142/78  Pulse: 78  SpO2: 98%  Weight: 262 lb (118.8 kg)  Height: 5' 5 (1.651 m)      Body mass index is 43.6 kg/m.    General: Well developed, well nourished female in no apparent distress.  HEENT: AT/[place], no external lesions. Hearing intact to the spoken word Eyes: EOMI. Conjunctiva clear and no icterus. Neck: Trachea midline, neck supple  Abdomen: Soft Neurologic: Alert,  oriented, normal speech Extremities: No pedal pitting edema Skin: Warm. + acanthosis nigricans Psychiatric: Does not appear depressed or anxiou  Diabetic Foot Exam - Simple   Simple Foot Form Visual Inspection No deformities, no ulcerations, no other skin breakdown bilaterally: Yes Sensation Testing Intact to touch and monofilament testing bilaterally: Yes Pulse Check Posterior Tibialis and Dorsalis pulse intact bilaterally: Yes Comments    LABS Reviewed Lab Results  Component Value Date   HGBA1C 5.4 08/16/2024   No components found for: MICROALDIP, CREATURINE, MICCRERAT   ASSESSMENT / PLAN  1. Type 2 diabetes mellitus without complication, without long-term current use of insulin (HCC)   2. Morbid obesity (HCC)     Diabetes Mellitus type 2, controlled. with no known complications.  -Hemoglobin A1c 5.4% has controlled type 2 diabetes mellitus.   Patient has controlled diabetes mellitus.  To have better weight loss benefit we will switch from Ozempic  to Mounjaro  as long as it is covered by medical insurance.  He is okay not to restart metformin .  She stopped taking metformin  due to lightheadedness.  - Medications:  I) discontinue metformin  1000 mg 2 times a day. II) switch from Ozempic   2 mg weekly to Mounjaro  10 mg weekly.  Will gradually increase the dose of Mounjaro , patient is asked to call our clinic after a month if she feels comfortable will increase the dose.  - Home glucose testing: recommended at least few times a week at different times of the day.  Prescription for test supplies sent.  - Discussed/ Gave Hypoglycemia treatment plan - Annual urine for microalbuminuria/ creatinine ratio.  Microalbuminuria present.  On Lisinopril .  - Foot check nightly . - Annual dilated diabetic eye exams. - Diet: Make healthy diabetic food choices, discussed in detail. - Life style / activity / exercise: Discussed.  2. Blood pressure  -  BP Readings from Last 1  Encounters:  08/16/24 (!) 142/78    - Control is in target.  - No change in current plans.  3. Lipid status / Hyperlipidemia -No indication of statin at this time.  Orders placed   Orders Placed This Encounter  Procedures   POCT HgB A1C   POCT Glucose (Device for Home Use)     DISPOSITION Follow up in clinic in 6 months suggested.   All questions answered and patient verbalized understanding of the plan.  Shannon Shor, MD [facility] [address] [place], [address] [place], [place] [address] Phone # [phone]  At least part of this note was generated using voice recognition software. Inadvertent word errors may have occurred, which were not recognized during the proofreading process.

## 2024-08-17 ENCOUNTER — Telehealth: Payer: Self-pay

## 2024-08-17 NOTE — Telephone Encounter (Signed)
 Please initiate a prior authorization for Mounjaro .

## 2024-08-21 ENCOUNTER — Telehealth: Payer: Self-pay

## 2024-08-21 ENCOUNTER — Other Ambulatory Visit (HOSPITAL_COMMUNITY): Payer: Self-pay

## 2024-09-19 ENCOUNTER — Ambulatory Visit: Admitting: Endocrinology

## 2025-02-13 ENCOUNTER — Ambulatory Visit: Admitting: Endocrinology
